# Patient Record
Sex: Female | Born: 1950 | Race: White | Hispanic: No | Marital: Married | Smoking: Never smoker
Health system: Southern US, Community
[De-identification: ages and names within clinical notes are randomized; demographics above are authoritative.]

## PROBLEM LIST (undated history)

## (undated) DIAGNOSIS — D649 Anemia, unspecified: Secondary | ICD-10-CM

## (undated) DIAGNOSIS — E039 Hypothyroidism, unspecified: Secondary | ICD-10-CM

## (undated) DIAGNOSIS — M810 Age-related osteoporosis without current pathological fracture: Secondary | ICD-10-CM

## (undated) DIAGNOSIS — I10 Essential (primary) hypertension: Secondary | ICD-10-CM

## (undated) DIAGNOSIS — F419 Anxiety disorder, unspecified: Secondary | ICD-10-CM

## (undated) DIAGNOSIS — T7840XA Allergy, unspecified, initial encounter: Secondary | ICD-10-CM

## (undated) DIAGNOSIS — L130 Dermatitis herpetiformis: Secondary | ICD-10-CM

## (undated) DIAGNOSIS — K219 Gastro-esophageal reflux disease without esophagitis: Secondary | ICD-10-CM

## (undated) HISTORY — PX: COLONOSCOPY: SHX174

## (undated) HISTORY — DX: Anxiety disorder, unspecified: F41.9

## (undated) HISTORY — DX: Allergy, unspecified, initial encounter: T78.40XA

## (undated) HISTORY — DX: Age-related osteoporosis without current pathological fracture: M81.0

## (undated) HISTORY — DX: Gastro-esophageal reflux disease without esophagitis: K21.9

## (undated) HISTORY — DX: Dermatitis herpetiformis: L13.0

## (undated) HISTORY — PX: CYSTECTOMY: SHX5119

## (undated) HISTORY — PX: APPENDECTOMY: SHX54

## (undated) HISTORY — DX: Essential (primary) hypertension: I10

---

## 1998-06-07 ENCOUNTER — Other Ambulatory Visit: Admission: RE | Admit: 1998-06-07 | Discharge: 1998-06-07 | Payer: Self-pay | Admitting: Obstetrics and Gynecology

## 1998-08-13 ENCOUNTER — Emergency Department (HOSPITAL_COMMUNITY): Admission: EM | Admit: 1998-08-13 | Discharge: 1998-08-13 | Payer: Self-pay | Admitting: Emergency Medicine

## 1999-06-08 ENCOUNTER — Other Ambulatory Visit: Admission: RE | Admit: 1999-06-08 | Discharge: 1999-06-08 | Payer: Self-pay | Admitting: Obstetrics and Gynecology

## 2000-05-23 ENCOUNTER — Encounter: Admission: RE | Admit: 2000-05-23 | Discharge: 2000-05-23 | Payer: Self-pay | Admitting: Unknown Physician Specialty

## 2000-05-23 ENCOUNTER — Encounter: Payer: Self-pay | Admitting: Obstetrics and Gynecology

## 2000-06-11 ENCOUNTER — Other Ambulatory Visit: Admission: RE | Admit: 2000-06-11 | Discharge: 2000-06-11 | Payer: Self-pay | Admitting: Obstetrics and Gynecology

## 2001-05-25 ENCOUNTER — Encounter: Payer: Self-pay | Admitting: Obstetrics and Gynecology

## 2001-05-25 ENCOUNTER — Encounter: Admission: RE | Admit: 2001-05-25 | Discharge: 2001-05-25 | Payer: Self-pay | Admitting: Obstetrics and Gynecology

## 2001-06-17 ENCOUNTER — Other Ambulatory Visit: Admission: RE | Admit: 2001-06-17 | Discharge: 2001-06-17 | Payer: Self-pay | Admitting: Obstetrics and Gynecology

## 2002-09-20 ENCOUNTER — Other Ambulatory Visit: Admission: RE | Admit: 2002-09-20 | Discharge: 2002-09-20 | Payer: Self-pay | Admitting: Obstetrics and Gynecology

## 2002-09-23 ENCOUNTER — Encounter: Admission: RE | Admit: 2002-09-23 | Discharge: 2002-09-23 | Payer: Self-pay | Admitting: Obstetrics and Gynecology

## 2002-09-23 ENCOUNTER — Encounter: Payer: Self-pay | Admitting: Obstetrics and Gynecology

## 2003-07-22 ENCOUNTER — Ambulatory Visit (HOSPITAL_COMMUNITY): Admission: RE | Admit: 2003-07-22 | Discharge: 2003-07-22 | Payer: Self-pay | Admitting: Obstetrics and Gynecology

## 2003-07-22 ENCOUNTER — Encounter (INDEPENDENT_AMBULATORY_CARE_PROVIDER_SITE_OTHER): Payer: Self-pay | Admitting: *Deleted

## 2003-09-27 ENCOUNTER — Encounter: Admission: RE | Admit: 2003-09-27 | Discharge: 2003-09-27 | Payer: Self-pay | Admitting: Obstetrics and Gynecology

## 2003-12-08 ENCOUNTER — Other Ambulatory Visit: Admission: RE | Admit: 2003-12-08 | Discharge: 2003-12-08 | Payer: Self-pay | Admitting: Obstetrics and Gynecology

## 2004-10-15 ENCOUNTER — Encounter: Admission: RE | Admit: 2004-10-15 | Discharge: 2004-10-15 | Payer: Self-pay | Admitting: Obstetrics and Gynecology

## 2004-12-12 ENCOUNTER — Other Ambulatory Visit: Admission: RE | Admit: 2004-12-12 | Discharge: 2004-12-12 | Payer: Self-pay | Admitting: Obstetrics and Gynecology

## 2005-10-22 ENCOUNTER — Encounter: Admission: RE | Admit: 2005-10-22 | Discharge: 2005-10-22 | Payer: Self-pay | Admitting: Obstetrics and Gynecology

## 2006-01-02 ENCOUNTER — Other Ambulatory Visit: Admission: RE | Admit: 2006-01-02 | Discharge: 2006-01-02 | Payer: Self-pay | Admitting: Obstetrics and Gynecology

## 2006-11-11 ENCOUNTER — Encounter: Admission: RE | Admit: 2006-11-11 | Discharge: 2006-11-11 | Payer: Self-pay | Admitting: Obstetrics and Gynecology

## 2007-10-23 ENCOUNTER — Ambulatory Visit: Payer: Self-pay | Admitting: Internal Medicine

## 2007-10-23 LAB — CONVERTED CEMR LAB
Albumin: 4 g/dL (ref 3.5–5.2)
Alkaline Phosphatase: 57 units/L (ref 39–117)
BUN: 20 mg/dL (ref 6–23)
Basophils Absolute: 0 10*3/uL (ref 0.0–0.1)
Bilirubin Urine: NEGATIVE
Cholesterol: 158 mg/dL (ref 0–200)
Creatinine, Ser: 0.8 mg/dL (ref 0.4–1.2)
Eosinophils Absolute: 0.6 10*3/uL (ref 0.0–0.6)
GFR calc Af Amer: 95 mL/min
GFR calc non Af Amer: 79 mL/min
Glucose, Urine, Semiquant: NEGATIVE
HDL: 41.8 mg/dL (ref >39.0)
Hemoglobin: 13.4 g/dL (ref 12.0–15.0)
Ketones, urine, test strip: NEGATIVE
LDL Cholesterol: 104 mg/dL — ABNORMAL HIGH (ref 0–99)
MCHC: 33.5 g/dL (ref 30.0–36.0)
MCV: 90.1 fL (ref 78.0–100.0)
Monocytes Absolute: 0.5 10*3/uL (ref 0.2–0.7)
Monocytes Relative: 9.4 % (ref 3.0–11.0)
Neutro Abs: 3.1 10*3/uL (ref 1.4–7.7)
Nitrite: NEGATIVE
Potassium: 5.1 meq/L (ref 3.5–5.1)
Protein, U semiquant: NEGATIVE
RDW: 12.5 % (ref 11.5–14.6)
Specific Gravity, Urine: 1.025
TSH: 3.54 microintl units/mL (ref 0.35–5.50)
Urobilinogen, UA: 0.2
pH: 6

## 2007-11-04 ENCOUNTER — Ambulatory Visit: Payer: Self-pay | Admitting: Internal Medicine

## 2007-11-04 DIAGNOSIS — K219 Gastro-esophageal reflux disease without esophagitis: Secondary | ICD-10-CM | POA: Insufficient documentation

## 2007-11-04 DIAGNOSIS — I10 Essential (primary) hypertension: Secondary | ICD-10-CM | POA: Insufficient documentation

## 2007-11-05 ENCOUNTER — Telehealth: Payer: Self-pay | Admitting: Internal Medicine

## 2007-12-04 ENCOUNTER — Encounter: Admission: RE | Admit: 2007-12-04 | Discharge: 2007-12-04 | Payer: Self-pay | Admitting: Obstetrics and Gynecology

## 2007-12-25 ENCOUNTER — Ambulatory Visit: Payer: Self-pay | Admitting: Internal Medicine

## 2008-02-22 ENCOUNTER — Ambulatory Visit: Payer: Self-pay | Admitting: Internal Medicine

## 2008-02-22 DIAGNOSIS — D4959 Neoplasm of unspecified behavior of other genitourinary organ: Secondary | ICD-10-CM | POA: Insufficient documentation

## 2008-02-23 ENCOUNTER — Encounter: Payer: Self-pay | Admitting: Internal Medicine

## 2008-03-24 ENCOUNTER — Encounter (INDEPENDENT_AMBULATORY_CARE_PROVIDER_SITE_OTHER): Payer: Self-pay | Admitting: Obstetrics and Gynecology

## 2008-03-24 ENCOUNTER — Ambulatory Visit (HOSPITAL_COMMUNITY): Admission: RE | Admit: 2008-03-24 | Discharge: 2008-03-24 | Payer: Self-pay | Admitting: Obstetrics and Gynecology

## 2008-04-11 ENCOUNTER — Ambulatory Visit: Payer: Self-pay | Admitting: Internal Medicine

## 2008-04-14 ENCOUNTER — Telehealth: Payer: Self-pay | Admitting: Internal Medicine

## 2008-04-14 ENCOUNTER — Encounter: Payer: Self-pay | Admitting: Internal Medicine

## 2008-04-25 ENCOUNTER — Telehealth: Payer: Self-pay | Admitting: Internal Medicine

## 2008-04-26 ENCOUNTER — Ambulatory Visit: Payer: Self-pay | Admitting: Internal Medicine

## 2008-04-27 ENCOUNTER — Telehealth: Payer: Self-pay | Admitting: Internal Medicine

## 2008-08-22 ENCOUNTER — Ambulatory Visit: Payer: Self-pay | Admitting: Internal Medicine

## 2008-08-22 DIAGNOSIS — F411 Generalized anxiety disorder: Secondary | ICD-10-CM | POA: Insufficient documentation

## 2008-08-25 ENCOUNTER — Emergency Department (HOSPITAL_COMMUNITY): Admission: EM | Admit: 2008-08-25 | Discharge: 2008-08-25 | Payer: Self-pay | Admitting: Family Medicine

## 2008-10-03 ENCOUNTER — Ambulatory Visit: Payer: Self-pay | Admitting: Internal Medicine

## 2008-10-13 ENCOUNTER — Encounter
Admission: RE | Admit: 2008-10-13 | Discharge: 2008-11-03 | Payer: Self-pay | Admitting: Physical Medicine & Rehabilitation

## 2008-12-26 ENCOUNTER — Telehealth: Payer: Self-pay | Admitting: Internal Medicine

## 2008-12-28 ENCOUNTER — Ambulatory Visit: Payer: Self-pay | Admitting: Family Medicine

## 2009-01-02 ENCOUNTER — Encounter: Admission: RE | Admit: 2009-01-02 | Discharge: 2009-01-02 | Payer: Self-pay | Admitting: Obstetrics and Gynecology

## 2009-01-25 ENCOUNTER — Ambulatory Visit: Payer: Self-pay | Admitting: Family Medicine

## 2009-06-20 ENCOUNTER — Telehealth: Payer: Self-pay | Admitting: Internal Medicine

## 2009-06-26 ENCOUNTER — Ambulatory Visit: Payer: Self-pay | Admitting: Internal Medicine

## 2010-01-22 ENCOUNTER — Encounter: Admission: RE | Admit: 2010-01-22 | Discharge: 2010-01-22 | Payer: Self-pay | Admitting: Obstetrics and Gynecology

## 2010-05-27 ENCOUNTER — Emergency Department (HOSPITAL_COMMUNITY): Admission: EM | Admit: 2010-05-27 | Discharge: 2010-05-27 | Payer: Self-pay | Admitting: Family Medicine

## 2010-08-06 ENCOUNTER — Ambulatory Visit: Payer: Self-pay | Admitting: Internal Medicine

## 2010-08-06 LAB — CONVERTED CEMR LAB
AST: 22 units/L (ref 0–37)
Albumin: 3.9 g/dL (ref 3.5–5.2)
Basophils Absolute: 0 10*3/uL (ref 0.0–0.1)
Basophils Relative: 1 % (ref 0.0–3.0)
Bilirubin Urine: NEGATIVE
CO2: 29 meq/L (ref 19–32)
Chloride: 108 meq/L (ref 96–112)
Eosinophils Absolute: 0.6 10*3/uL (ref 0.0–0.7)
Glucose, Bld: 96 mg/dL (ref 70–99)
HCT: 37.9 % (ref 36.0–46.0)
HDL: 49.4 mg/dL (ref >39.00)
Hemoglobin: 13 g/dL (ref 12.0–15.0)
Lymphs Abs: 1.1 10*3/uL (ref 0.7–4.0)
MCHC: 34.4 g/dL (ref 30.0–36.0)
Neutro Abs: 2.4 10*3/uL (ref 1.4–7.7)
Nitrite: NEGATIVE
Potassium: 4 meq/L (ref 3.5–5.1)
RBC: 4.14 M/uL (ref 3.87–5.11)
RDW: 13.2 % (ref 11.5–14.6)
Sodium: 143 meq/L (ref 135–145)
Specific Gravity, Urine: 1.02 (ref 1.000–1.030)
TSH: 4.21 microintl units/mL (ref 0.35–5.50)
Total CHOL/HDL Ratio: 4
Total Protein, Urine: NEGATIVE mg/dL
Total Protein: 6.9 g/dL (ref 6.0–8.3)
pH: 5.5 (ref 5.0–8.0)

## 2010-08-16 ENCOUNTER — Ambulatory Visit: Payer: Self-pay | Admitting: Internal Medicine

## 2010-08-31 ENCOUNTER — Telehealth: Payer: Self-pay | Admitting: Internal Medicine

## 2010-08-31 ENCOUNTER — Ambulatory Visit: Payer: Self-pay | Admitting: Internal Medicine

## 2010-09-24 ENCOUNTER — Ambulatory Visit
Admission: RE | Admit: 2010-09-24 | Discharge: 2010-09-24 | Payer: Self-pay | Source: Home / Self Care | Attending: Internal Medicine | Admitting: Internal Medicine

## 2010-10-11 NOTE — Assessment & Plan Note (Signed)
Summary: CONGESTION // RS   Vital Signs:  Patient profile:   60 year old female Height:      64 inches Weight:      148 pounds BMI:     25.50 O2 Sat:      92 % on Room air Temp:     99.3 degrees F oral Pulse rate:   103 / minute BP sitting:   100 / 78  (left arm)  Vitals Entered By: Jeremy Johann CMA (August 16, 2010 10:24 AM)  O2 Flow:  Room air CC: cough,wheezing, drainage, burning in chest, pain in teethx1day   CC:  cough, wheezing, drainage, burning in chest, and pain in teethx1day.  History of Present Illness: Patient presents to clinic as a workin for evaluation of URI symptoms and wheezing. Notes recent 2d h/o bilateral maxillary sinus pressure and NP cough without f/c or shortness of breath. H/o asthma using advair as needed and does not have active prescription for albuterol mdi at home. H/o HTN prescribed norvasc 5mg  once daily but states BP improved with exercise and wt loss. Was told by outside provider to stop bp med however bp begin to rise. Pt resumed at 2.5mg  once daily and BP now normotensive. Tolerates medication without adverse effect.   Current Medications (verified): 1)  Proventil Hfa 108 (90 Base) Mcg/act  Aers (Albuterol Sulfate) .... 2 Puffs Q 6 Hours As Needed 2)  Amlodipine Besylate 5 Mg  Tabs (Amlodipine Besylate) .... One By Mouth Once Daily 3)  Singulair 10 Mg Tabs (Montelukast Sodium) .... One By Mouth Once Daily 4)  Aspirin 81 Mg Tabs (Aspirin) .... Once Daily 5)  Advair Diskus 100-50 Mcg/dose Misc (Fluticasone-Salmeterol) .Marland Kitchen.. 1 Puff 2 Times Daily  Allergies (verified): 1)  ! Sulfa 2)  ! Prednisone 3)  ! Pcn 4)  ! Ace Inhibitors  Review of Systems      See HPI  Physical Exam  General:  Well-developed,well-nourished,in no acute distress; alert,appropriate and cooperative throughout examination Head:  Normocephalic and atraumatic without obvious abnormalities. No apparent alopecia or balding. Eyes:  vision grossly intact, pupils equal,  and pupils round.   Ears:  External ear exam shows no significant lesions or deformities.  Otoscopic examination reveals clear canals, tympanic membranes are intact bilaterally without bulging, retraction, inflammation or discharge. Hearing is grossly normal bilaterally. Nose:  External nasal examination shows no deformity or inflammation. Nasal mucosa are pink and moist without lesions or exudates. Mouth:  Oral mucosa and oropharynx without lesions or exudates.  Teeth in good repair. Neck:  No deformities, masses, or tenderness noted. Lungs:  Normal respiratory effort, chest expands symmetrically. Lungs are clear to auscultation, no crackles or wheezes. Heart:  Normal rate and regular rhythm. S1 and S2 normal without gallop, murmur, click, rub or other extra sounds.   Impression & Recommendations:  Problem # 1:  ASTHMA (ICD-493.90) Mild in severity. No evidence of respiratory distress. Refill and resume albuterol as needed. Discussed advair as maintenance medication and not rescue (rinse mouth with use.) Followup closely if no improvement or worsening.  Her updated medication list for this problem includes:    Proventil Hfa 108 (90 Base) Mcg/act Aers (Albuterol sulfate) .Marland Kitchen... 2 puffs q 6 hours as needed    Singulair 10 Mg Tabs (Montelukast sodium) ..... One by mouth once daily    Advair Diskus 100-50 Mcg/dose Misc (Fluticasone-salmeterol) .Marland Kitchen... 1 puff 2 times daily  Problem # 2:  URI (ICD-465.9) Suspect viral etiology at this point. Advise symptomatic  otc treatment. Follwup if no improvment or worsening.  Problem # 3:  HYPERTENSION (ICD-401.9) Currently normotensive s/p medication adjustment. Continue current regimen, monitor bp as an outpt and followup in clinic as scheduled.  Her updated medication list for this problem includes:    Amlodipine Besylate 5 Mg Tabs (Amlodipine besylate) .Marland Kitchen... 1/2 by mouth qd  Complete Medication List: 1)  Proventil Hfa 108 (90 Base) Mcg/act Aers  (Albuterol sulfate) .... 2 puffs q 6 hours as needed 2)  Amlodipine Besylate 5 Mg Tabs (Amlodipine besylate) .... 1/2 by mouth qd 3)  Singulair 10 Mg Tabs (Montelukast sodium) .... One by mouth once daily 4)  Aspirin 81 Mg Tabs (Aspirin) .... Once daily 5)  Advair Diskus 100-50 Mcg/dose Misc (Fluticasone-salmeterol) .Marland Kitchen.. 1 puff 2 times daily  Patient Instructions: 1)  Get plenty of rest, drink lots of clear liquids, and use Tylenol or Ibuprofen for fever and comfort. Return in 7-10 days if you're not better:sooner if you're feeling worse. Prescriptions: PROVENTIL HFA 108 (90 BASE) MCG/ACT  AERS (ALBUTEROL SULFATE) 2 puffs q 6 hours as needed  #1 x 6   Entered and Authorized by:   Edwyna Perfect MD   Signed by:   Edwyna Perfect MD on 08/16/2010   Method used:   Electronically to        [facility] [address] [phone]* (retail)       [address]       [place], [place]  [address]       Ph: [phone] or [phone]       Fax: [phone]   RxID:   [id number]    Orders Added: 1)  Est. Patient Level IV [47425]

## 2010-10-11 NOTE — Assessment & Plan Note (Signed)
Summary: CPX // RS---PT RSC (BMP) // RS   Vital Signs:  Patient profile:   60 year old female Height:      64.25 inches Weight:      149 pounds Temp:     98.7 degrees F oral Pulse rate:   80 / minute Pulse rhythm:   regular BP sitting:   104 / 76  (left arm) Cuff size:   regular  Vitals Entered By: Alfred Levins, CMA (August 31, 2010 9:07 AM) CC: cpx, no pap   CC:  cpx and no pap.  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 2 Puffs Q 6 Hours As Needed 2)  Amlodipine Besylate 5 Mg  Tabs (Amlodipine Besylate) .... 1/2 By Mouth Qd 3)  Singulair 10 Mg Tabs (Montelukast Sodium) .... One By Mouth Once Daily 4)  Aspirin 81 Mg Tabs (Aspirin) .... Once Daily 5)  Advair Diskus 100-50 Mcg/dose Misc (Fluticasone-Salmeterol) .Marland Kitchen.. 1 Puff 2 Times Daily 6)  Calcium 600 Mg Tabs (Calcium) .... Once Daily 7)  Juice Plus Fibre  Liqd (Nutritional Supplements) .... Once Daily  Allergies (verified): 1)  ! Sulfa 2)  ! Prednisone 3)  ! Pcn 4)  ! Ace Inhibitors  Past History:  Past Medical History: Last updated: 08/22/2008 Asthma GERD Hypertension Anxiety  Past Surgical History: Last updated: 04/11/2008 Appendectomy D &C bleeding vaginal cyst removal  03/28/08--neg per pt  Family History: Last updated: 11/04/2007 father --alcoholic--- mother---MS, lung Cancer age 49 Sister with MS  Social History: Last updated: 11/04/2007 widow Never Smoked Alcohol use-no Regular exercise-yes  Risk Factors: Exercise: yes (11/04/2007)  Risk Factors: Smoking Status: never (06/26/2009)  Physical Exam  General:  well-developed  well-nourished female in no acute distress. HEENT exam atraumatic, normocephalic symmetric muscles are intact. Neck is supple without lymphadenopathy or thyromegaly. Chest clear auscultation cardiac exam S1-S2 are regular abdominal exam it breath sounds, soft, nontender. Extremities is no clubbing cyanosis or edema. Gait is normal.   Impression & Recommendations:  Problem # 1:  PHYSICAL EXAMINATION (ICD-V70.0) health maintenance up-to-date. she has had a protracted URI. Reviewed Dr. Ty Hilts. I'll try an antibiotic. Doxycycline 100 mg p.o. b.i.d.  Complete Medication List: 1)  Proventil Hfa 108 (90 Base) Mcg/act Aers (Albuterol sulfate) .... 2 puffs q 6 hours as needed 2)  Amlodipine Besylate 5 Mg Tabs (Amlodipine besylate) .... 1/2 by mouth qd 3)  Singulair 10 Mg Tabs (Montelukast sodium) .... One by mouth once daily 4)  Aspirin 81 Mg Tabs (Aspirin) .... Once daily 5)  Advair Diskus 100-50 Mcg/dose Misc (Fluticasone-salmeterol) .Marland Kitchen.. 1 puff 2 times daily 6)  Calcium 600 Mg Tabs (Calcium) .... Once daily 7)  Juice Plus Fibre Liqd (Nutritional supplements) .... Once daily 8)  Doxycycline Hyclate 100 Mg Caps (Doxycycline hyclate) .... Take 1 tab twice a day  Other Orders: Tdap => 80yrs IM ([address]109) Admin 1st Vaccine (60454) Flu Vaccine 60yrs + (09811) Admin of Any Addtl Vaccine (91478) Prescriptions: PROVENTIL HFA 108 (90 BASE) MCG/ACT  AERS (ALBUTEROL SULFATE) 2 puffs q 6 hours as needed  #3 x 3   Entered by:   Willy Eddy, LPN   Authorized by:  Birdie Sons MD   Signed by:   Willy Eddy, LPN on 44/09/270   Method used:   Electronically to        [facility]* (retail)             ,          Ph: [phone]       Fax: [phone]   RxID:   [id number] ADVAIR DISKUS 100-50 MCG/DOSE MISC (FLUTICASONE-SALMETEROL) 1 puff 2 times daily  #3 x 3   Entered by:   Willy Eddy, LPN   Authorized by:   Birdie Sons MD   Signed by:   Willy Eddy, LPN on 30/16/0109    Method used:   Electronically to        [facility]* (retail)             ,          Ph: [phone]       Fax: [phone]   RxID:   [id number] SINGULAIR 10 MG TABS (MONTELUKAST SODIUM) one by mouth once daily  #90 x 3   Entered by:   Willy Eddy, LPN   Authorized by:   Birdie Sons MD   Signed by:   Willy Eddy, LPN on 03/04/9484   Method used:   Electronically to        [facility]* (retail)             ,          Ph: [phone]       Fax: [phone]   RxID:   [id number] ADVAIR DISKUS 100-50 MCG/DOSE MISC (FLUTICASONE-SALMETEROL) 1 puff 2 times daily  #3 x 3   Entered and Authorized by:   Birdie Sons MD   Signed by:   Birdie Sons MD on 08/31/2010   Method used:   Electronically to        [facility] [address] [phone]* (retail)       [address]       [place], [place]  [address]       Ph: [phone] or [phone]       Fax: [phone]   RxID:   [id number] SINGULAIR 10 MG TABS (MONTELUKAST SODIUM) one by mouth once daily  #90 x 3   Entered and Authorized by:   Birdie Sons MD   Signed by:   Birdie Sons MD on 08/31/2010   Method used:   Electronically to        [facility] [address] [phone]* (retail)       [address]       [place], [place]  [address]       Ph: [phone] or [phone]       Fax: [phone]   RxID:   [id number] AMLODIPINE BESYLATE 5 MG  TABS (AMLODIPINE BESYLATE) 1/2 by mouth qd  #90 x 3   Entered and Authorized by:   Birdie Sons MD   Signed by:   Birdie Sons MD on 08/31/2010   Method used:   Electronically to        [facility] [address] [phone]* (retail)       [address]       [place], [place]  [address]       Ph: [phone] or [phone]  Fax: [phone]   RxID:   [id number] DOXYCYCLINE HYCLATE 100 MG CAPS (DOXYCYCLINE HYCLATE) Take 1 tab twice a day  #20 x 0   Entered and Authorized by:   Birdie Sons MD   Signed by:    Birdie Sons MD on 08/31/2010   Method used:   Electronically to        [facility] [address] [phone]* (retail)       [address]       [place], [place]  [address]       Ph: [phone] or [phone]       Fax: [phone]   RxID:   [id number]    Orders Added: 1)  Est. Patient 40-64 years [99396] 2)  Tdap => 1yrs IM [90715] 3)  Admin 1st Vaccine [90471] 4)  Flu Vaccine 62yrs + [88416] 5)  Admin of Any Addtl Vaccine [90472]   Immunizations Administered:  Tetanus Vaccine:    Vaccine Type: Tdap    Site: right deltoid    Mfr: GlaxoSmithKline    Dose: 0.5 ml    Route: IM    Given by: Alfred Levins, CMA    Exp. Date: 06/28/2012    Lot #: [id number]  Influenza Vaccine # 1:    Vaccine Type: Fluvax 3+    Site: left deltoid    Mfr: Sanofi Pasteur    Dose: 0.5 ml    Route: IM    Given by: Alfred Levins, CMA    Exp. Date: 03/09/2011    Lot #: [id number]  Flu Vaccine Consent Questions:    Do you have a history of severe allergic reactions to this vaccine? no    Any prior history of allergic reactions to egg and/or gelatin? no    Do you have a sensitivity to the preservative Thimersol? no    Do you have a past history of Guillan-Barre Syndrome? no    Do you currently have an acute febrile illness? no    Have you ever had a severe reaction to latex? no    Vaccine information given and explained to patient? yes    Are you currently pregnant? no   Immunizations Administered:  Tetanus Vaccine:    Vaccine Type: Tdap    Site: right deltoid    Mfr: GlaxoSmithKline    Dose: 0.5 ml    Route: IM    Given by: Alfred Levins, CMA    Exp. Date: 06/28/2012    Lot #: [id number]  Influenza Vaccine # 1:    Vaccine Type: Fluvax 3+    Site: left deltoid    Mfr: Sanofi Pasteur    Dose: 0.5 ml    Route: IM    Given by: Alfred Levins, CMA    Exp. Date: 03/09/2011    Lot #: [id number]  Preventive Care Screening  Mammogram:    Date:  02/07/2010    Next Due:   02/2011    Results:  normal   Pap Smear:    Date:  02/07/2010    Next Due:  02/2011    Results:  normal

## 2010-10-11 NOTE — Assessment & Plan Note (Signed)
Summary: pink eye/sore throat/cjr   Vital Signs:  Patient profile:   60 year old female Weight:      147 pounds Pulse rate:   72 / minute BP sitting:   108 / 74  (left arm)  Vitals Entered By: Kyung Rudd, CMA (September 24, 2010 10:51 AM) CC: pt c/o ST and conjunctivitis...son and granddaughter both have conjuctivitis   CC:  pt c/o ST and conjunctivitis...son and granddaughter both have conjuctivitis.  History of Present Illness: Patient presents to clinic as a workin for evaluation of pink eye. Notes several day h/o slightly red irritated right eye with + drainage. +ST and ear pressure. +sick exposure with 2 family members being treated with abx for similar symptoms. Also notes last week developed lbp radiating to upper thigh but now resolved spontaneously. No trauma/injury/leg weakness. Took motrin 600mg .  Now with only intermittent lbp without radicular symptoms.   Current Medications (verified): 1)  Proventil Hfa 108 (90 Base) Mcg/act  Aers (Albuterol Sulfate) .... 2 Puffs Q 6 Hours As Needed 2)  Amlodipine Besylate 5 Mg  Tabs (Amlodipine Besylate) .... 1/2 By Mouth Qd 3)  Singulair 10 Mg Tabs (Montelukast Sodium) .... One By Mouth Once Daily 4)  Aspirin 81 Mg Tabs (Aspirin) .... Once Daily 5)  Advair Diskus 100-50 Mcg/dose Misc (Fluticasone-Salmeterol) .Marland Kitchen.. 1 Puff 2 Times Daily 6)  Calcium 600 Mg Tabs (Calcium) .... Once Daily 7)  Juice Plus Fibre  Liqd (Nutritional Supplements) .... Once Daily 8)  Flexeril 5 Mg Tabs (Cyclobenzaprine Hcl) .Marland Kitchen.. 1 Tab Three Times A Day For 5 Days 9)  Ibuprofen 600 Mg Tabs (Ibuprofen) .Marland Kitchen.. 1 Tab Three Times A Day 10)  Norco 5-325 Mg Tabs (Hydrocodone-Acetaminophen) .Marland Kitchen.. 1 Tab Q 4-6 Hours As Needed  Allergies (verified): 1)  ! Sulfa 2)  ! Prednisone 3)  ! Pcn 4)  ! Ace Inhibitors  Past History:  Past medical, surgical, family and social histories (including risk factors) reviewed, and no changes noted (except as noted below).  Past  Medical History: Reviewed history from 08/22/2008 and no changes required. Asthma GERD Hypertension Anxiety  Past Surgical History: Reviewed history from 04/11/2008 and no changes required. Appendectomy D &C bleeding vaginal cyst removal  03/28/08--neg per pt  Family History: Reviewed history from 11/04/2007 and no changes required. father --alcoholic--- mother---MS, lung Cancer age 72 Sister with MS  Social History: Reviewed history from 11/04/2007 and no changes required. widow Never Smoked Alcohol use-no Regular exercise-yes  Review of Systems General:  Denies chills, fever, and sweats. Eyes:  Complains of discharge, eye irritation, and red eye; denies eye pain and vision loss-1 eye. ENT:  Complains of nasal congestion and sore throat; denies ear discharge and earache. Resp:  Denies cough, shortness of breath, and sputum productive. MS:  Complains of low back pain; denies muscle weakness.  Physical Exam  General:  Well-developed,well-nourished,in no acute distress; alert,appropriate and cooperative throughout examination Head:  Normocephalic and atraumatic without obvious abnormalities. No apparent alopecia or balding. Eyes:  pupils equal, pupils round, and conjunctival injection and mild erythema right eye. No discharge noted   Ears:  External ear exam shows no significant lesions or deformities.  Otoscopic examination reveals clear canals, tympanic membranes are intact bilaterally without bulging, retraction, inflammation or discharge. Hearing is grossly normal bilaterally. Nose:  External nasal examination shows no deformity or inflammation. Nasal mucosa are pink and moist without lesions or exudates. Mouth:  + mild op erythema good dentition, no posterior lymphoid hypertrophy, no postnasal drip,  and no lesions.   Neck:  No deformities, masses, or tenderness noted. Lungs:  Normal respiratory effort, chest expands symmetrically. Lungs are clear to auscultation, no  crackles or wheezes. Msk:  normal ROM.  no mildline ld tenderness or bony abn. no paraspinal muscle spasm. able to wt bear and ambulate without assistance. Neurologic:  alert & oriented X3 and gait normal.   Skin:  turgor normal, color normal, and no rashes.     Impression & Recommendations:  Problem # 1:  URI (ICD-465.9) Assessment New Begin abx tx. Followup if no improvement or worsening.  Her updated medication list for this problem includes:    Aspirin 81 Mg Tabs (Aspirin) ..... Once daily    Ibuprofen 600 Mg Tabs (Ibuprofen) .Marland Kitchen... 1 tab three times a day  Problem # 2:  BACK PAIN, LUMBAR (ICD-724.2) Assessment: New Muscle relaxer and analgesia as needed. Cautioned [id number] sedating effect Her updated medication list for this problem includes:    Aspirin 81 Mg Tabs (Aspirin) ..... Once daily    Flexeril 5 Mg Tabs (Cyclobenzaprine hcl) ..... One by mouth three times a day as needed muscle spasm    Ibuprofen 600 Mg Tabs (Ibuprofen) .Marland Kitchen... 1 tab three times a day    Norco 5-325 Mg Tabs (Hydrocodone-acetaminophen) .Marland Kitchen... 1 tab q 4-6 hours as needed  Complete Medication List: 1)  Proventil Hfa 108 (90 Base) Mcg/act Aers (Albuterol sulfate) .... 2 puffs q 6 hours as needed 2)  Amlodipine Besylate 5 Mg Tabs (Amlodipine besylate) .... 1/2 by mouth qd 3)  Singulair 10 Mg Tabs (Montelukast sodium) .... One by mouth once daily 4)  Aspirin 81 Mg Tabs (Aspirin) .... Once daily 5)  Advair Diskus 100-50 Mcg/dose Misc (Fluticasone-salmeterol) .Marland Kitchen.. 1 puff 2 times daily 6)  Calcium 600 Mg Tabs (Calcium) .... Once daily 7)  Juice Plus Fibre Liqd (Nutritional supplements) .... Once daily 8)  Flexeril 5 Mg Tabs (Cyclobenzaprine hcl) .... One by mouth three times a day as needed muscle spasm 9)  Ibuprofen 600 Mg Tabs (Ibuprofen) .Marland Kitchen.. 1 tab three times a day 10)  Norco 5-325 Mg Tabs (Hydrocodone-acetaminophen) .Marland Kitchen.. 1 tab q 4-6 hours as needed 11)  Zithromax Z-pak 250 Mg Tabs (Azithromycin) .... As  directed Prescriptions: NORCO 5-325 MG TABS (HYDROCODONE-ACETAMINOPHEN) 1 tab q 4-6 hours as needed  #20 x 0   Entered and Authorized by:   Edwyna Perfect MD   Signed by:   Edwyna Perfect MD on 09/24/2010   Method used:   Print then Give to Patient   RxID:   [id number] FLEXERIL 5 MG TABS (CYCLOBENZAPRINE HCL) one by mouth three times a day as needed muscle spasm  #15 x 0   Entered and Authorized by:   Edwyna Perfect MD   Signed by:   Edwyna Perfect MD on 09/24/2010   Method used:   Print then Give to Patient   RxID:   [id number] ZITHROMAX Z-PAK 250 MG TABS (AZITHROMYCIN) as directed  #1 x 0   Entered and Authorized by:   Edwyna Perfect MD   Signed by:   Edwyna Perfect MD on 09/24/2010   Method used:   Print then Give to Patient   RxID:   [id number]    Orders Added: 1)  Est. Patient Level IV [44034]

## 2010-10-11 NOTE — Progress Notes (Signed)
Summary: QUESTION CONCERNING MEDS / PLEASE RETURN CALL  Phone Note Call from Patient   Caller: Patient   [phone] Summary of Call: Pt called to advise that there was some confusion concerning the prescriptions that were sent in for her this morning.... would like to speak with Dr Cato Mulligan or Nurse to discuss meds.  Pt can be reached at [phone].  Initial call taken by: Debbra Riding,  August 31, 2010 3:34 PM  Follow-up for Phone Call        wants singulair,advair, and proventil sent to [facility]-(done)also questioning about amlodipine- she states she was told to stop ,but refill was sent in- does Dr Cato Mulligan want her to take?  pt aware it will be tuesday before this will be answered Follow-up by: Willy Eddy, LPN,  August 31, 2010 3:54 PM  Additional Follow-up for Phone Call Additional follow up Details #1::        amlodipine called in "just into case" . she is to stop amlodipine and monitor Bp goal BP less than 135/85 Additional Follow-up by: Birdie Sons MD,  September 03, 2010 4:53 PM    Additional Follow-up for Phone Call Additional follow up Details #2::    [facility]  Alfred Levins, CMA  September 04, 2010 1:23 PM [facility]  Alfred Levins, [place]  September 06, 2010 9:56 AM pt aware Follow-up by: Alfred Levins, CMA,  September 11, 2010 4:56 PM

## 2010-12-04 ENCOUNTER — Telehealth: Payer: Self-pay | Admitting: *Deleted

## 2010-12-04 NOTE — Telephone Encounter (Signed)
[facility] Triage Call Report Triage Record Num: [id number] Operator: Albertine Grates Patient Name: Tracey Boyd Call Date & Time: 12/03/2010 5:53:02PM Patient Phone: [phone] PCP: Valetta Mole. [place]words Patient Gender: Female PCP Fax : [phone] Patient DOB: 11-09-1950 [facility] Name: [facility] Reason for Call: Has cloudy urine since 3-26 and burns when goes. Afebrile. Denies back/flank pain. Advised follow up with office 3-27. States is going to UC. Protocol(s) Used: Urinary Symptoms - Female Recommended Outcome per Protocol: See Provider within 24 hours Reason for Outcome: Has one or more urinary tract symptoms Care Advice: ~ 12/03/2010 6:00:16PM Page 1 of 1 [email or web address]

## 2010-12-04 NOTE — Telephone Encounter (Signed)
Call patient. Make sure she is doing okay. Otherwise she needs an office visit within the available provider.

## 2010-12-05 NOTE — Telephone Encounter (Signed)
Pt went to Urgent Care on [address] and she was put on Cipro.  She is feeling better

## 2011-01-22 NOTE — H&P (Signed)
NAME:  Tracey Boyd, HARDRICK NO.:  [id number]   MEDICAL RECORD NO.:  [id number]          PATIENT TYPE:  AMB   LOCATION:  SDC                           [facility]ILITY:  [facility]   PHYSICIAN:  Juluis Mire, M.D.   DATE OF BIRTH:  [birth year]   DATE OF ADMISSION:  03/24/2008  DATE OF DISCHARGE:                              HISTORY & PHYSICAL   The patient is a 60 year old, gravida 1, para 1 female, who presents for  excision of a vaginal wall cyst.  Noted on exam that she had a left-  sided vaginal wall cyst.  Subsequent ultrasound confirmed the presence  of a simple cyst in the vaginal area.  It measured approximately a  centimeter.  Discussed options including watching conservatively versus  removal, but the patient now presents for surgical removal of the left  wall vaginal cyst.   ALLERGIES:  No known drug allergies.   MEDICATIONS:  At the present time, she is on a calcium channel blocker  for management of hypertension.   PAST MEDICAL HISTORY:  Usual childhood disease.  No significant  sequelae.  History of a ruptured appendix with removal and she has had  one vaginal delivery.   FAMILY HISTORY:  Noncontributory.   SOCIAL HISTORY:  No tobacco or alcohol use.   REVIEW OF SYSTEMS:  Noncontributory.   PHYSICAL EXAMINATION:  VITAL SIGNS:  The patient is afebrile with stable  vital signs.  HEENT:  The patient is normocephalic.  Pupils are equal, round, and  reactive to light and accommodation.  Extraocular movements are intact.  Sclerae and conjunctivae are clear.  Oropharynx clear.  NECK:  Without thyromegaly.  BREASTS:  Not examined.  LUNGS:  Clear.  CARDIOVASCULAR SYSTEM:  Regular rhythm and rate without murmurs or  gallops.  ABDOMEN:  Benign.  No mass, organomegaly, or tenderness.  PELVIC:  Normal external genitalia.  Vaginal mucosa does reveal a left-  sided vaginal wall cyst.  There is no other vaginal abnormality.  Cervix  unremarkable.  Uterus, normal size,  shape, and contour.  Adnexa free of  masses or tenderness.  EXTREMITIES:  Trace edema.  NEUROLOGIC:  Grossly within normal limits.   IMPRESSION:  Prior vaginal wall cyst.   PLAN:  The patient is to undergo surgical removal of vaginal wall cyst.  The risks of surgery have been discussed including the risk of  infection.  The risk of hemorrhage could require transfusion with risk  of AIDS or hepatitis.  Risk of injury to adjacent organs including  bladder, bowel, or ureters that could require further exploratory  surgery.  Risk of deep venous thrombosis and pulmonary emboli.  The  patient expressed understand of indications and risks.      Juluis Mire, M.D.  Electronically Signed     JSM/MEDQ  D:  03/24/2008  T:  03/24/2008  Job:  [id number]

## 2011-01-22 NOTE — Op Note (Signed)
NAME:  Tracey Boyd, Tracey Boyd NO.:  [id number]   MEDICAL RECORD NO.:  [id number]          PATIENT TYPE:  AMB   LOCATION:  SDC                           [facility]   PHYSICIAN:  Juluis Mire, M.D.   DATE OF BIRTH:  12/27/1950   DATE OF PROCEDURE:  03/24/2008  DATE OF DISCHARGE:                               OPERATIVE REPORT   PREOPERATIVE DIAGNOSIS:  Paravaginal cyst.   POSTOPERATIVE DIAGNOSIS:  Paravaginal cyst.   OPERATIVE PROCEDURE:  Excision of paravaginal cyst.   SURGEON:  Juluis Mire, MD   ANESTHESIA:  General.   ESTIMATED BLOOD LOSS:  Minimal.   PACKS AND DRAINS:  None.   INTRAOPERATIVE BLOOD PLACED:  None.   COMPLICATIONS:  None.   INDICATIONS:  Are dictated in history and physical.   PROCEDURE:  The patient was taken to the OR and placed in supine  position.  After satisfactory level of general anesthesia was obtained,  she was placed in the dorsal lithotomy position using the Allen  stirrups.  Perineum and vagina were cleansed out with Betadine.  The  patient was draped in sterile field.  A retractor was placed in the  vaginal vault.  She had a paravaginal cyst located on her left side.  It  was grasped with an Allis.  We entered the vaginal cyst.  Mucousy  material was removed.  We were able to dissect out the cyst lining.  It  was sent to pathology.  The defect was closed with a running suture of 3-  0 Vicryl Rapide.  Good hemostasis.  No active bleeding.  Bladder was  emptied by catheterization, was still clear.  The patient was taken out  of dorsal lithotomy position once alert and extubated, transferred to  the recovery room in good condition.  Sponge, instrument, and needle  count reported as correct by circulating nurse x2.      Juluis Mire, M.D.  Electronically Signed     JSM/MEDQ  D:  03/24/2008  T:  03/25/2008  Job:  [id number]

## 2011-01-25 NOTE — H&P (Signed)
NAME:  Tracey Boyd, Tracey Boyd                        ACCOUNT NO.:  [id number]   MEDICAL RECORD NO.:  [id number]                   PATIENT TYPE:  AMB   LOCATION:  SDC                                  [facility]   PHYSICIAN:  Juluis Mire, M.D.                DATE OF BIRTH:  1951/03/08   DATE OF ADMISSION:  07/22/2003  DATE OF DISCHARGE:                                HISTORY & PHYSICAL   HISTORY OF PRESENT ILLNESS:  The patient is a 60 year old postmenopausal  white female who presents for hysterectomy with D&C.   In relation to the present admission, patient was seen for evaluation of  postmenopausal bleeding.  She had a saline infusion ultrasound that was  performed on May 28 of last year.  She had several small intramural  fibroids.  There was no evidence of any endometrial extension.  We did do an  endometrial sampling which was unremarkable.  We watched her cycles  conservatively.  She has continued to have some abnormal bleeding and  because of this we did repeat the ultrasound.  It was basically  unremarkable.  She had no evidence of endometrial thickening but because of  continued bleeding we are going to proceed with hysteroscopic evaluation.   ALLERGIES:  No known drug allergies.   MEDICATIONS:  Advair as needed for asthma.   PAST MEDICAL HISTORY:  Usual childhood disease.  No significant sequelae.   PAST SURGICAL HISTORY:  She has had a previous appendectomy.   PAST OBSTETRICAL HISTORY:  She had one spontaneous vaginal delivery.   FAMILY HISTORY:  Noncontributory.   SOCIAL HISTORY:  No tobacco or alcohol use.   REVIEW OF SYSTEMS:  Noncontributory.   PHYSICAL EXAMINATION:  VITAL SIGNS:  The patient is afebrile with stable  vital signs.  HEENT:  The patient normocephalic.  Pupils are equal, round, and reactive to  light and accommodation.  Extraocular movements are intact.  Sclerae and  conjunctivae clear.  NECK:  Without thyromegaly.  BREASTS:  No discreet  masses.  LUNGS:  Clear.  CARDIAC:  Regular rhythm, rate.  No murmurs or gallops.  ABDOMEN:  Benign.  No mass, organomegaly, or tenderness.  PELVIC:  Normal external genitalia.  Vaginal mucosa is clear.  Cervix  unremarkable.  Uterus normal size, shape, and contour.  Adnexa free of  masses or tenderness.  Rectovaginal examination is clear.  EXTREMITIES:  Trace edema.  NEUROLOGIC:  Grossly within normal limits.   IMPRESSION:  Postmenopausal bleeding, rule out endometrial pathology.   PLAN:  The patient will undergo a hysteroscopy with D&C.  The risks of  surgery have been discussed including the risk of infection, the risk of  hemorrhage that could necessitate transfusion with the risk of AIDS or  hepatitis.  Risk of injury to adjacent organs through perforation that could  require exploratory surgery, risk of deep venous thrombosis and pulmonary  embolus.  The  patient expressed understanding of indications and risks.                                               Juluis Mire, M.D.    JSM/MEDQ  D:  07/22/2003  T:  07/22/2003  Job:  [id number]

## 2011-01-25 NOTE — Op Note (Signed)
   NAME:  Tracey Boyd, Tracey Boyd                        ACCOUNT NO.:  [id number]   MEDICAL RECORD NO.:  [id number]                   PATIENT TYPE:  AMB   LOCATION:  SDC                                  [facility]   PHYSICIAN:  Juluis Mire, M.D.                DATE OF BIRTH:  1951/08/01   DATE OF PROCEDURE:  07/22/2003  DATE OF DISCHARGE:                                 OPERATIVE REPORT   PREOPERATIVE DIAGNOSIS:  Postmenopausal bleeding.   POSTOPERATIVE DIAGNOSIS:  Postmenopausal bleeding.   OPERATIVE PROCEDURES:  1. Paracervical block.  2. Cervical dilation.  3. Removal of endocervical polyp.  4. Hysteroscopy resection of endometrium.  5. Endometrial curetting.   SURGEON:  Juluis Mire, M.D.   ANESTHESIA:  Paracervical block and sedation.   ESTIMATED BLOOD LOSS:  Minimal.   PACKS AND DRAINS:  None.   INTRAOPERATIVE BLOOD REPLACED:  None.   COMPLICATIONS:  None.   INDICATIONS:  Noted in the history and physical.   The procedure is as follows:  The patient was taken in the OR and placed in  the supine position.  After sedation, was placed in the dorsal lithotomy  position and draped as a sterile field.  A speculum was placed in the  vaginal vault, the cervix and vagina cleansed with Betadine.  A paracervical  block was instituted using 2% Nesacaine.  The cervix was grasped with a  single-tooth tenaculum.  The uterus sounded approximately 8 cm.  The cervix  was serially dilated to a size 35 Pratt dilator.  An endocervical polyp was  noted and removed using the Randall stone forceps.  The hysteroscope was  then introduced.  The intrauterine cavity was distended using sorbitol.  Visualization revealed a thinned, atrophic endometrium, no evidence of  thickness or polyps.  Multiple endometrial biopsies were obtained from the  anterior and posterior wall.  There was no sign of complication such as  perforation.  Deficit was minimal.  We then obtained endometrial curettings.   At this point in time a single-  tooth and speculum were then removed.  The patient was taken out of the  dorsal lithotomy position and once alert  transferred to the recovery room  in good condition.  Sponge, instrument, and needle count reported as correct  by the circulating nurse.                                               Juluis Mire, M.D.    JSM/MEDQ  D:  07/22/2003  T:  07/22/2003  Job:  [id number]

## 2011-02-18 ENCOUNTER — Encounter: Payer: Self-pay | Admitting: Internal Medicine

## 2011-02-22 ENCOUNTER — Ambulatory Visit (INDEPENDENT_AMBULATORY_CARE_PROVIDER_SITE_OTHER): Payer: 59 | Admitting: Internal Medicine

## 2011-02-22 ENCOUNTER — Encounter: Payer: Self-pay | Admitting: Internal Medicine

## 2011-02-22 VITALS — BP 110/86 | HR 89 | Temp 98.2°F | Ht 64.0 in | Wt 144.8 lb

## 2011-02-22 DIAGNOSIS — J449 Chronic obstructive pulmonary disease, unspecified: Secondary | ICD-10-CM

## 2011-02-22 DIAGNOSIS — J45909 Unspecified asthma, uncomplicated: Secondary | ICD-10-CM

## 2011-02-22 NOTE — Patient Instructions (Addendum)
Your spirometry today shows severe obstruction Please have full PFT next week here or  REturn to see me in < 1 week for PFT result reivew - overbook appointment

## 2011-02-22 NOTE — Assessment & Plan Note (Signed)
?   Asthma, ? COPD, ? asspociatd VCD - contributing to symptoms. ? Symptoms got worse paradoxically due to advair  PLAN Full PFT and reviewe At fu will consider initiating allergy workup depending on PFT result

## 2011-02-22 NOTE — Progress Notes (Signed)
Subjective:    Patient ID: Tracey Boyd, female    DOB: [birth year], 60 y.o.   MRN: [id number]  HPI  60 year old female. Mom of Mr. Tracey Boyd rep. Here for asthma consult. Referred by Dr. Juluis Mire  States she has dx of chronic asthma. Used to live in [place] (booth parents smoked). Diagnosed with asthma at age 8. Back in [place] used to have rare asthma attacks (needed albuterol during marathon or exercise or pollen and only 1/year). Then moved to [place] in 1983 at age 17 and noticed more intensity of asthma but still very occassional; needed an albuterol only 1 inh per year. Still doing lot of walking and golf without problems. However, in 2012 feels asthma got worse. She started noticing more dyspnea, wheezing, unable to sing choir. Was started on advair in jan 2012. Then singulair added in jan/feb. Feels symptoms got worse after singulair but also voice is hoarse, cracks and weird. So stopped singulair and advair 2 months ago.  But still dyspneic and using albuterol atleast once per night each month. Feels she takes a deep breath and then chest tightness. Also, still with cough and some sinus drainage. Feels mucus stuck in throat. Categorically denies active GERD although this is mentioned in past medical. Spirometry today at office shows Fev1 1.18L/48%, severe obstruction with ratio 58. She believes this is baseline and she has probably been living like this although only other time spirometry checked in past was in 1980s   Hx of spring allergies and positive allergen testing in 1980s. Never smoked but parents smoked. Mom died of lung cancer.  No mold in house. Works out of home. Lives in [place]. Asthma triggers: exercise, unpredictable nature. Associated risk factors: denies GERD but admits to possible post nasal drip. No ER visits, frequent prednisone bursts or intubations. Diagnosis: based on history, never had a spirometry since 1980s. Multiple drug allergies including  prednisone: hives and myalgia.  Husband is Doctor, hospital at [employer]. 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 - the [facility]  Review of Systems  Constitutional: Negative for fever and unexpected weight change.  HENT: Positive for voice change. Negative for ear pain, nosebleeds, congestion, sore throat, rhinorrhea, sneezing, trouble swallowing, dental problem, postnasal drip and sinus pressure.   Eyes: Negative for redness and itching.  Respiratory: Positive for cough and wheezing. Negative for chest tightness and shortness of breath.   Cardiovascular: Negative for palpitations and leg swelling.  Gastrointestinal: Negative for nausea and vomiting.  Genitourinary: Negative for dysuria.  Musculoskeletal: Negative for joint swelling.  Skin: Negative for rash.  Neurological: Negative for headaches.  Hematological: Does not bruise/bleed easily.  Psychiatric/Behavioral: Negative for dysphoric mood. The patient is not nervous/anxious.        Objective:   Physical Exam  Vitals reviewed. Constitutional: She is oriented to person, place, and time. She appears well-developed and well-nourished. No distress.  HENT:  Head: Normocephalic and atraumatic.  Right Ear: External ear normal.  Left Ear: External ear normal.  Mouth/Throat: Oropharynx is clear and moist. No oropharyngeal exudate.  Eyes: Conjunctivae and EOM are normal. Pupils are equal, round, and reactive to light. Right eye exhibits no discharge. Left eye exhibits no discharge. No scleral icterus.  Neck: Normal range of motion. Neck supple. No JVD present. No tracheal deviation present. No thyromegaly present.  Cardiovascular: Normal rate, regular rhythm, normal heart sounds and intact distal pulses.  Exam reveals no gallop and no friction rub.  No murmur heard. Pulmonary/Chest: Effort normal and breath sounds normal. No respiratory distress. She has no wheezes. She has no rales. She exhibits no tenderness.    Abdominal: Soft. Bowel sounds are normal. She exhibits no distension and no mass. There is no tenderness. There is no rebound and no guarding.  Musculoskeletal: Normal range of motion. She exhibits no edema and no tenderness.  Lymphadenopathy:    She has no cervical adenopathy.  Neurological: She is alert and oriented to person, place, and time. She has normal reflexes. No cranial nerve deficit. She exhibits normal muscle tone. Coordination normal.  Skin: Skin is warm and dry. No rash noted. She is not diaphoretic. No erythema. No pallor.  Psychiatric: She has a normal mood and affect. Her behavior is normal. Judgment and thought content normal.          Assessment & Plan:

## 2011-02-24 ENCOUNTER — Encounter: Payer: Self-pay | Admitting: Internal Medicine

## 2011-02-25 ENCOUNTER — Ambulatory Visit (INDEPENDENT_AMBULATORY_CARE_PROVIDER_SITE_OTHER): Payer: 59 | Admitting: Internal Medicine

## 2011-02-25 ENCOUNTER — Encounter: Payer: Self-pay | Admitting: Internal Medicine

## 2011-02-25 VITALS — BP 122/76 | HR 84 | Temp 98.3°F | Ht 65.0 in | Wt 146.0 lb

## 2011-02-25 DIAGNOSIS — J449 Chronic obstructive pulmonary disease, unspecified: Secondary | ICD-10-CM

## 2011-02-25 DIAGNOSIS — J4540 Moderate persistent asthma, uncomplicated: Secondary | ICD-10-CM

## 2011-02-25 DIAGNOSIS — J45909 Unspecified asthma, uncomplicated: Secondary | ICD-10-CM

## 2011-02-25 DIAGNOSIS — J45901 Unspecified asthma with (acute) exacerbation: Secondary | ICD-10-CM

## 2011-02-25 LAB — PULMONARY FUNCTION TEST

## 2011-02-25 MED ORDER — BECLOMETHASONE DIPROPIONATE 80 MCG/ACT IN AERS: 2.0000 | INHALATION_SPRAY | Freq: Two times a day (BID) | RESPIRATORY_TRACT | Status: DC

## 2011-02-25 NOTE — Patient Instructions (Addendum)
Tests show moderate - severe asthma Please start QVAR 2 puff twice daily, use with spacer. Use this daily regardless of how you feel If you are not seeing improvement in 10 days or so, please contact us If things are worsening anytime (nocturnal awakenings, symptoms) contact us immediately Use albuterol for rescue for symptoms Hold off on bubble study echo right now; I will talk to Dr. Cornelius Moras about it if you want me to  REturn in 3 weeks with me or Tammy my NP with spirometry (I am on vacation through 7/4-  7/23 and it is important you are seen in 3 weeks time) AT followup if lung function still shows room for  improvement, we can add singulair or consider switching you to symbicort Also at followup depending on response consider checking IgE and doing allergy evaluation

## 2011-02-25 NOTE — Progress Notes (Signed)
PFT done today. 

## 2011-02-25 NOTE — Progress Notes (Signed)
  Subjective:    Patient ID: Tracey Boyd, female    DOB: 27-Dec-1950, 60 y.o.   MRN: [id number]  HPI OV 02/25/2011: followu for asthma evaluation/diagnosis. PFTs 02/25/2011: shows findings c/w moderate-severe obstruction with BD response though TLC and DLCO are slightly diminished. Fev1 is 1.3/55%, 40% BD resppnse. TLC 75%, DLCO 78%. She is now reporting daily nocturnal awakenings. Not using albuterol for rescue. No other symptoms. States that Dr. Cornelius Moras of [facility] told her she needs bubble study   Review of Systems Daily nocturanl awakenings + Chest tightness sporadically + Raspy voice + Rest 11 point ROS negative    Objective:   Physical Exam  Vitals reviewed. Constitutional: She is oriented to person, place, and time. She appears well-developed and well-nourished. No distress.  HENT:  Head: Normocephalic and atraumatic.  Right Ear: External ear normal.  Left Ear: External ear normal.  Mouth/Throat: Oropharynx is clear and moist. No oropharyngeal exudate.  Eyes: Conjunctivae and EOM are normal. Pupils are equal, round, and reactive to light. Right eye exhibits no discharge. Left eye exhibits no discharge. No scleral icterus.  Neck: Normal range of motion. Neck supple. No JVD present. No tracheal deviation present. No thyromegaly present.  Cardiovascular: Normal rate, regular rhythm, normal heart sounds and intact distal pulses.  Exam reveals no gallop and no friction rub.   No murmur heard. Pulmonary/Chest: Effort normal and breath sounds normal. No respiratory distress. She has no wheezes. She has no rales. She exhibits no tenderness.  Abdominal: Soft. Bowel sounds are normal. She exhibits no distension and no mass. There is no tenderness. There is no rebound and no guarding.  Musculoskeletal: Normal range of motion. She exhibits no edema and no tenderness.  Lymphadenopathy:    She has no cervical adenopathy.  Neurological: She is alert and oriented to person, place, and time. She has  normal reflexes. No cranial nerve deficit. She exhibits normal muscle tone. Coordination normal.  Skin: Skin is warm and dry. No rash noted. She is not diaphoretic. No erythema. No pallor.  Psychiatric: She has a normal mood and affect. Her behavior is normal. Judgment and thought content normal.          Assessment & Plan:

## 2011-02-25 NOTE — Assessment & Plan Note (Addendum)
PFts 02/25/2011 (first ever)  -fev1 1.31L/55%, 40% post Bd response, TLC 75% and slightly low, DLCO 16/78% and slightly low  Above fits in with Moderate- severe asthma. She is now having daily nocturnal awakening. Uncleear why advair and singulair did not work for her; ? Compliance (Was doing it only 1 per day), ? Large size steroid particile of advair resulting in raspy voice, ? Subjective intolerance. By NIH guidelines she needs LABA  + ICS combo but given issues with steroid particle, I will start her on QVAR alone (use spacer to help reduce risk fo throat issues)  and reasses in 3 weeks with spirometry. AT fu if not substantially improved, change to symbicort or/and add singulair.  Also, consider IgE and allergy wu at fu. Depending on course, can consier CT chest to sort out low dlco  Note: she is neighbors to Dr. Dr Molinda Bailiff of [facility] and apparently he has told her she might need bubble study. AT present I do not see an indication for it. I will follow her closely and then we can decide. She knows to get in touch with [place] if she is not better or getting worse.

## 2011-02-27 ENCOUNTER — Encounter: Payer: Self-pay | Admitting: Internal Medicine

## 2011-03-18 ENCOUNTER — Encounter: Payer: Self-pay | Admitting: Adult Health

## 2011-03-18 ENCOUNTER — Ambulatory Visit (INDEPENDENT_AMBULATORY_CARE_PROVIDER_SITE_OTHER): Payer: 59 | Admitting: Adult Health

## 2011-03-18 VITALS — BP 100/70 | HR 75 | Temp 98.0°F | Ht 65.0 in | Wt 150.2 lb

## 2011-03-18 DIAGNOSIS — J4540 Moderate persistent asthma, uncomplicated: Secondary | ICD-10-CM

## 2011-03-18 DIAGNOSIS — J45901 Unspecified asthma with (acute) exacerbation: Secondary | ICD-10-CM

## 2011-03-18 DIAGNOSIS — J45909 Unspecified asthma, uncomplicated: Secondary | ICD-10-CM

## 2011-03-18 DIAGNOSIS — J449 Chronic obstructive pulmonary disease, unspecified: Secondary | ICD-10-CM

## 2011-03-18 MED ORDER — BECLOMETHASONE DIPROPIONATE 80 MCG/ACT IN AERS: 2.0000 | INHALATION_SPRAY | Freq: Two times a day (BID) | RESPIRATORY_TRACT | Status: DC

## 2011-03-18 MED ORDER — ALBUTEROL 90 MCG/ACT IN AERS: 2.0000 | INHALATION_SPRAY | RESPIRATORY_TRACT | Status: DC | PRN

## 2011-03-18 NOTE — Progress Notes (Signed)
  Subjective:    Patient ID: Tracey Boyd, female    DOB: 02-12-1951, 60 y.o.   MRN: [id number]  HPI OV 02/25/2011: followu for asthma evaluation/diagnosis. PFTs 02/25/2011: shows findings c/w moderate-severe obstruction with BD response though TLC and DLCO are slightly diminished. Fev1 is 1.3/55%, 40% BD resppnse. TLC 75%, DLCO 78%. She is now reporting daily nocturnal awakenings. Not using albuterol for rescue. No other symptoms. States that Dr. Cornelius Moras of [facility] told her she needs bubble study  Follow OV 03/18/2011  Pt returns feeling much better with decreased wheezing and dsypnea. DOE is decreased. Feels she can take in deep breath.  Spirometry today is much improved with FEV1 increased from 55% >>82%. No cough or wheezing    Review of Systems Constitutional:   No  weight loss, night sweats,  Fevers, chills, fatigue, or  lassitude.  HEENT:   No headaches,  Difficulty swallowing,  Tooth/dental problems, or  Sore throat,                No sneezing, itching, ear ache, nasal congestion, post nasal drip,   CV:  No chest pain,  Orthopnea, PND, swelling in lower extremities, anasarca, dizziness, palpitations, syncope.   GI  No heartburn, indigestion, abdominal pain, nausea, vomiting, diarrhea, change in bowel habits, loss of appetite, bloody stools.   Resp: No shortness of breath with exertion or at rest.  No excess mucus, no productive cough,  No non-productive cough,  No coughing up of blood.  No change in color of mucus.  No wheezing.  No chest wall deformity  Skin: no rash or lesions.  GU: no dysuria, change in color of urine, no urgency or frequency.  No flank pain, no hematuria   MS:  No joint pain or swelling.  No decreased range of motion.  No back pain.  Psych:  No change in mood or affect. No depression or anxiety.  No memory loss.         Objective:   Physical Exam GEN: A/Ox3; pleasant , NAD, well nourished   HEENT:  [place]/AT,  EACs-clear, TMs-wnl, NOSE-clear, THROAT-clear, no  lesions, no postnasal drip or exudate noted.   NECK:  Supple w/ fair ROM; no JVD; normal carotid impulses w/o bruits; no thyromegaly or nodules palpated; no lymphadenopathy.  RESP  Clear  P & A; w/o, wheezes/ rales/ or rhonchi.no accessory muscle use, no dullness to percussion  CARD:  RRR, no m/r/g  , no peripheral edema, pulses intact, no cyanosis or clubbing.  GI:   Soft & nt; nml bowel sounds; no organomegaly or masses detected.  Musco: Warm bil, no deformities or joint swelling noted.   Neuro: alert, no focal deficits noted.    Skin: Warm, no lesions or rashes          Assessment & Plan:

## 2011-03-18 NOTE — Patient Instructions (Signed)
Tests show improved lung function  Continue on  QVAR 2 puff twice daily, use with spacer. Use this daily regardless of how you feel  Use Ventolin as needed only-RESCUER   follow up Dr. Marchelle Gearing in 2 months

## 2011-03-27 ENCOUNTER — Telehealth: Payer: Self-pay | Admitting: Internal Medicine

## 2011-03-27 NOTE — Telephone Encounter (Signed)
Spoke with [facility]. They are just needing supervising MD name since rxs were sent per TP. Gave her MR's name and NPI. Nothing further needed.

## 2011-03-27 NOTE — Assessment & Plan Note (Addendum)
Improved control on QVAR Asthma education given   Plan:  Tests show improved lung function  Continue on  QVAR 2 puff twice daily, use with spacer. Use this daily regardless of how you feel  Use Ventolin as needed only-RESCUER   follow up Dr. Marchelle Gearing in 2 months

## 2011-05-20 ENCOUNTER — Ambulatory Visit (INDEPENDENT_AMBULATORY_CARE_PROVIDER_SITE_OTHER): Payer: 59 | Admitting: Internal Medicine

## 2011-05-20 ENCOUNTER — Encounter: Payer: Self-pay | Admitting: Internal Medicine

## 2011-05-20 VITALS — BP 114/78 | HR 63 | Temp 98.0°F | Ht 65.0 in | Wt 153.6 lb

## 2011-05-20 DIAGNOSIS — J45909 Unspecified asthma, uncomplicated: Secondary | ICD-10-CM

## 2011-05-20 DIAGNOSIS — Z23 Encounter for immunization: Secondary | ICD-10-CM

## 2011-05-20 DIAGNOSIS — J4540 Moderate persistent asthma, uncomplicated: Secondary | ICD-10-CM

## 2011-05-20 NOTE — Assessment & Plan Note (Signed)
Clincially improved control though when she was using albuterol at will she felt better. She is a poor perceiver in any event. So, I have advised she return and do spirometry (currently not using albuterol after education it is for prn use only). Depending on level of control, step up Rx or continue same. Advised flu shot today; she has agreed

## 2011-05-20 NOTE — Progress Notes (Signed)
  Subjective:    Patient ID: Tracey Boyd, female    DOB: 1951/04/30, 60 y.o.   MRN: [id number]  HPI OV 02/25/2011: followu for asthma evaluation/diagnosis. PFTs 02/25/2011: shows findings c/w moderate-severe obstruction with BD response though TLC and DLCO are slightly diminished. Fev1 is 1.3/55%, 40% BD resppnse. TLC 75%, DLCO 78%. She is now reporting daily nocturnal awakenings. Not using albuterol for rescue. No other symptoms. States that Dr. Cornelius Moras of [facility] told her she needs bubble study  Follow OV 03/18/2011  Pt returns feeling much better with decreased wheezing and dsypnea. DOE is decreased. Feels she can take in deep breath.  Spirometry today is much improved with FEV1 increased from 55% >>82%. No cough or wheezing   Ov 05/17/11: Followup asthma. SAw NP 2 months ago for followup on response to QVAR. Noted to have improved fev1 to normal but today she states it was done while she was using albuterol at will along with QVAR because she did not realize it was rescue medication. Since then not using albuterol. Feels well and greatly improved. No nocturnal awakenings. No cough. No chest tightness. Effort tolerance though improved compared to June is not as good as when she was using albuterol a  Lot.   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  HENT: Negative.  Negative for ear pain, nosebleeds, congestion, sore throat, rhinorrhea, sneezing, trouble swallowing, dental problem, postnasal drip and sinus pressure.   Eyes: Negative.  Negative for redness and itching.  Respiratory: Negative.  Negative for cough, chest tightness, shortness of breath and wheezing.   Cardiovascular: Negative.  Negative for palpitations and leg swelling.  Gastrointestinal: Negative.  Negative for nausea and vomiting.  Genitourinary: Negative.  Negative for dysuria.  Musculoskeletal:  Negative.  Negative for joint swelling.  Skin: Negative.  Negative for rash.  Neurological: Negative.  Negative for headaches.  Hematological: Negative.  Does not bruise/bleed easily.  Psychiatric/Behavioral: Negative.  Negative for dysphoric mood. The patient is not nervous/anxious.        Objective:   Physical Exam  GEN: A/Ox3; pleasant , NAD, well nourished   HEENT:  [place]/AT,  EACs-clear, TMs-wnl, NOSE-clear, THROAT-clear, no lesions, no postnasal drip or exudate noted.   NECK:  Supple w/ fair ROM; no JVD; normal carotid impulses w/o bruits; no thyromegaly or nodules palpated; no lymphadenopathy.  RESP  Clear  P & A; w/o, wheezes/ rales/ or rhonchi.no accessory muscle use, no dullness to percussion  CARD:  RRR, no m/r/g  , no peripheral edema, pulses intact, no cyanosis or clubbing.  GI:   Soft & nt; nml bowel sounds; no organomegaly or masses detected.  Musco: Warm bil, no deformities or joint swelling noted.   Neuro: alert, no focal deficits noted.    Skin: Warm, no lesions or rashes       Assessment & Plan:

## 2011-05-20 NOTE — Patient Instructions (Signed)
Glad you are better Continue qvar 2 puff twice daily  Use albuterol only as needed To assess true control, return at your convenience, to our office and have simple spirometry done Please let me know once that spirometry is done so I can call you back with plan to continue or step up asthma treatment Have flu shot today Followup in 4-5 months or sooner iif needed

## 2011-06-07 LAB — CBC
HCT: 40.1
Hemoglobin: 13.6
MCV: 91
Platelets: 264
RBC: 4.41
WBC: 6.1

## 2011-09-19 ENCOUNTER — Encounter: Payer: Self-pay | Admitting: Family

## 2011-09-19 ENCOUNTER — Ambulatory Visit (INDEPENDENT_AMBULATORY_CARE_PROVIDER_SITE_OTHER): Payer: 59 | Admitting: Family

## 2011-09-19 VITALS — BP 122/84 | Temp 98.5°F | Wt 152.0 lb

## 2011-09-19 DIAGNOSIS — I10 Essential (primary) hypertension: Secondary | ICD-10-CM

## 2011-09-19 NOTE — Patient Instructions (Signed)

## 2011-09-19 NOTE — Progress Notes (Signed)
Subjective:    Patient ID: Tracey Boyd, female    DOB: [birth year], 61 y.o.   MRN: [id number]  HPI 61 year old white female, patient of Dr. Cato Mulligan is in today with complaints of elevated blood pressure after leaving the chin. Elevated blood pressure was accompanied by a headache. Blood pressure was in the 170s systolically and gradually returned back down to normal. She denies any lightheadedness, dizziness, chest pain, palpitations, shortness of breath, or edema.   Review of Systems  Constitutional: Negative.   HENT: Negative.   Respiratory: Negative.   Cardiovascular: Negative.   Genitourinary: Negative.   Musculoskeletal: Negative.   Skin: Negative.   Neurological: Negative.   Hematological: Negative.   Psychiatric/Behavioral: Negative.    Past Medical History  Diagnosis Date  . Asthma   . GERD (gastroesophageal reflux disease)   . HTN (hypertension)   . Anxiety   . Dermatitis herpetiformis     History   Social History  . Marital Status: Married    Spouse Name: N/A    Number of Children: N/A  . 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 Smoking status: Passive Smoker  . Smokeless tobacco: Never Used   Comment: pt states both parents were smokers  . Alcohol Use: No  . Drug Use: No  . Sexually Active: Not on file   Other Topics Concern  . Not on file   Social History Narrative  . No narrative on file    Past Surgical History  Procedure Date  . Appendectomy   . Cystectomy     Family History  Problem Relation Age of Onset  . Alcohol abuse Father   . Multiple sclerosis Mother   . Lung cancer Mother   . Multiple sclerosis Sister     Allergies  Allergen Reactions  . Ace Inhibitors     REACTION: cough (lisinopril)  . Penicillins     REACTION: hives  . Prednisone     REACTION: paresthesias  . Sulfonamide Derivatives     REACTION: hives    Current Outpatient  Prescriptions on File Prior to Visit  Medication Sig Dispense Refill  . albuterol (PROVENTIL HFA) 108 (90 BASE) MCG/ACT inhaler Inhale 2 puffs into the lungs every 6 (six) hours as needed.        Marland Kitchen aspirin 81 MG tablet Take 81 mg by mouth daily.        . beclomethasone (QVAR) 80 MCG/ACT inhaler Inhale 2 puffs into the lungs 2 (two) times daily.  3 Inhaler  3  . calcium carbonate (OS-CAL) 600 MG TABS Take 600 mg by mouth 2 (two) times daily with a meal.        . cyclobenzaprine (FLEXERIL) 5 MG tablet Take 5 mg by mouth 3 (three) times daily as needed.        Marland Kitchen HYDROcodone-acetaminophen (NORCO) 5-325 MG per tablet Take 1 tablet by mouth every 6 (six) hours as needed.        Marland Kitchen ibuprofen (ADVIL,MOTRIN) 600 MG tablet Take 600 mg by mouth every 6 (six) hours as needed.          BP 122/84  Temp(Src) 98.5 F (36.9 C) (Oral)  Wt 152 lb (68.947 kg)chart   Objective:   Physical Exam  Constitutional: She is oriented to person, place, and time. She appears well-developed and well-nourished.  Neck: Normal range of motion. Neck supple.  Cardiovascular: Normal rate  and regular rhythm.   Pulmonary/Chest: Effort normal and breath sounds normal.  Abdominal: Soft. Bowel sounds are normal.  Neurological: She is alert and oriented to person, place, and time.  Skin: Skin is warm and dry.  Psychiatric: She has a normal mood and affect.    BP recheck: 108/74      Assessment & Plan:  Assessment: Hypertension  Plan: Her blood pressures been stable over the last several office visits and even today. She has no indication to start antihypertensive agents. Continue exercise, healthy diet. Patient will start the blood pressure diary and will report these to worsen one week. Call the office if symptoms worsen or persist recheck or sooner when necessary.

## 2011-09-20 ENCOUNTER — Ambulatory Visit (INDEPENDENT_AMBULATORY_CARE_PROVIDER_SITE_OTHER): Payer: 59 | Admitting: Internal Medicine

## 2011-09-20 ENCOUNTER — Encounter: Payer: Self-pay | Admitting: Internal Medicine

## 2011-09-20 DIAGNOSIS — F411 Generalized anxiety disorder: Secondary | ICD-10-CM

## 2011-09-20 DIAGNOSIS — I10 Essential (primary) hypertension: Secondary | ICD-10-CM

## 2011-09-20 MED ORDER — AMLODIPINE BESYLATE 2.5 MG PO TABS: 2.5000 mg | ORAL_TABLET | Freq: Every day | ORAL | Status: DC

## 2011-09-20 NOTE — Progress Notes (Signed)
  Subjective:    Patient ID: Tracey Boyd, female    DOB: November 25, 1950, 61 y.o.   MRN: [id number]  HPI 61 year old patient who has a history of asthma anxiety disorder and also prior history of treated hypertension. She has had substantial weight loss and amlodipine has been discontinued. More recently she has had some very labile blood pressure readings some quite elevated. She was seen yesterday with her blood pressure concerns and blood pressure was normal. Blood pressure arrival here today 118/70. She took a single elevated amlodipine yesterday due to a blood pressure reading of 180/120.  She uses a hand-held manometer with stethoscope for blood pressure determinations   Review of Systems     Objective:   Physical Exam  Constitutional:       Blood pressure on arrival 118/70 Repeat blood pressure 124/84  Cardiovascular: Normal rate, regular rhythm and normal heart sounds.   Pulmonary/Chest: Effort normal and breath sounds normal.  Musculoskeletal: She exhibits no edema.          Assessment & Plan:   Labile hypertension. Options were discussed. We'll resume amlodipine at a 2.5 mg dose. We'll continue a restricted salt diet exercise regimen and followup in 6 weeks. Will continue home blood pressure monitoring

## 2011-09-20 NOTE — Patient Instructions (Signed)
Limit your sodium (Salt) intake    It is important that you exercise regularly, at least 20 minutes 3 to 4 times per week.  If you develop chest pain or shortness of breath seek  medical attention.  Please check your blood pressure on a regular basis.  If it is consistently greater than 150/90, please make an office appointment.  

## 2011-09-23 ENCOUNTER — Encounter: Payer: Self-pay | Admitting: Internal Medicine

## 2011-09-23 ENCOUNTER — Telehealth: Payer: Self-pay | Admitting: Internal Medicine

## 2011-09-23 NOTE — Telephone Encounter (Signed)
This was done Friday

## 2011-09-23 NOTE — Telephone Encounter (Signed)
Pt needs a script for amLODipine (NORVASC) 2.5 MG tablet to send to [facility] order.

## 2011-09-27 ENCOUNTER — Telehealth: Payer: Self-pay | Admitting: *Deleted

## 2011-09-27 MED ORDER — CIPROFLOXACIN HCL 250 MG PO TABS: 250.0000 mg | ORAL_TABLET | Freq: Two times a day (BID) | ORAL | Status: AC

## 2011-09-27 NOTE — Telephone Encounter (Signed)
(  triage voicemail)  Pt has uti, usually gets one annually.  Requesting to have rx called in to [facility] at [address] and [address].  Unable to leave house due to snow, [place]

## 2011-09-27 NOTE — Telephone Encounter (Signed)
Notified pt. 

## 2011-09-27 NOTE — Telephone Encounter (Signed)
If this message is correct .Marland Kitchen Call in cipro 250 mg po bid for 5 days

## 2011-09-27 NOTE — Telephone Encounter (Signed)
Error

## 2011-10-14 ENCOUNTER — Other Ambulatory Visit: Payer: Self-pay | Admitting: Internal Medicine

## 2011-10-14 MED ORDER — AMLODIPINE BESYLATE 2.5 MG PO TABS: 2.5000 mg | ORAL_TABLET | Freq: Every day | ORAL | Status: DC

## 2011-10-14 NOTE — Telephone Encounter (Signed)
rx sent in electronically 

## 2011-10-14 NOTE — Telephone Encounter (Signed)
Pt need amlodipine 2.5mg  #90 sent to [facility]. [facility] never received order

## 2011-11-01 ENCOUNTER — Encounter: Payer: Self-pay | Admitting: Internal Medicine

## 2011-11-01 ENCOUNTER — Ambulatory Visit (INDEPENDENT_AMBULATORY_CARE_PROVIDER_SITE_OTHER): Payer: 59 | Admitting: Internal Medicine

## 2011-11-01 ENCOUNTER — Ambulatory Visit (AMBULATORY_SURGERY_CENTER): Payer: 59 | Admitting: *Deleted

## 2011-11-01 VITALS — Ht 64.0 in | Wt 150.0 lb

## 2011-11-01 DIAGNOSIS — Z1211 Encounter for screening for malignant neoplasm of colon: Secondary | ICD-10-CM

## 2011-11-01 DIAGNOSIS — Z2911 Encounter for prophylactic immunotherapy for respiratory syncytial virus (RSV): Secondary | ICD-10-CM

## 2011-11-01 DIAGNOSIS — I10 Essential (primary) hypertension: Secondary | ICD-10-CM

## 2011-11-01 MED ORDER — PEG-KCL-NACL-NASULF-NA ASC-C 100 G PO SOLR: ORAL | Status: DC

## 2011-11-01 NOTE — Progress Notes (Signed)
Patient ID: Tracey Boyd, female   DOB: 1951-08-31, 61 y.o.   MRN: [id number] Htn: home bps 90-120/60-70 She has no side effects on meds  Past Medical History  Diagnosis Date  . Asthma   . GERD (gastroesophageal reflux disease)   . HTN (hypertension)   . Anxiety   . Dermatitis herpetiformis     History   Social History  . Marital Status: Married    Spouse Name: N/A    Number of Children: N/A  . 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 Smoking status: Passive Smoker  . Smokeless tobacco: Never Used   Comment: pt states both parents were smokers  . Alcohol Use: No  . Drug Use: No  . Sexually Active: Not on file   Other Topics Concern  . Not on file   Social History Narrative  . No narrative on file    Past Surgical History  Procedure Date  . Appendectomy   . Cystectomy     Family History  Problem Relation Age of Onset  . Alcohol abuse Father   . Multiple sclerosis Mother   . Lung cancer Mother   . Multiple sclerosis Sister     Allergies  Allergen Reactions  . Ace Inhibitors     REACTION: cough (lisinopril)  . Penicillins     REACTION: hives  . Prednisone     REACTION: paresthesias  . Sulfonamide Derivatives     REACTION: hives    Current Outpatient Prescriptions on File Prior to Visit  Medication Sig Dispense Refill  . amLODipine (NORVASC) 2.5 MG tablet Take 1 tablet (2.5 mg total) by mouth daily.  90 tablet  3  . aspirin 81 MG tablet Take 81 mg by mouth daily.        . beclomethasone (QVAR) 80 MCG/ACT inhaler Inhale 2 puffs into the lungs 2 (two) times daily.  3 Inhaler  3  . calcium carbonate (OS-CAL) 600 MG TABS Take 600 mg by mouth 2 (two) times daily with a meal.           patient denies chest pain, shortness of breath, orthopnea. Denies lower extremity edema, abdominal pain, change in appetite, change in bowel movements. Patient denies rashes, musculoskeletal  complaints. No other specific complaints in a complete review of systems.   BP 100/80  Pulse 78  Temp(Src) 98.3 F (36.8 C) (Oral)  Wt 151 lb (68.493 kg)  SpO2 98%  Well-developed well-nourished female in no acute distress. HEENT exam atraumatic, normocephalic, extraocular muscles are intact. Neck is supple. No jugular venous distention no thyromegaly. Chest clear to auscultation without increased work of breathing. Cardiac exam S1 and S2 are regular. Abdominal exam active bowel sounds, soft, nontender.

## 2011-11-04 NOTE — Assessment & Plan Note (Signed)
BP Readings from Last 3 Encounters:  11/01/11 100/80  09/20/11 118/70  09/19/11 122/84   Controlled Continue same meds

## 2011-11-08 ENCOUNTER — Encounter: Payer: Self-pay | Admitting: Internal Medicine

## 2011-11-08 ENCOUNTER — Ambulatory Visit (AMBULATORY_SURGERY_CENTER): Payer: 59 | Admitting: Internal Medicine

## 2011-11-08 VITALS — BP 120/78 | HR 69 | Temp 98.4°F | Resp 15 | Ht 64.0 in | Wt 150.0 lb

## 2011-11-08 DIAGNOSIS — Z1211 Encounter for screening for malignant neoplasm of colon: Secondary | ICD-10-CM

## 2011-11-08 MED ORDER — SODIUM CHLORIDE 0.9 % IV SOLN: 500.0000 mL | INTRAVENOUS | Status: DC

## 2011-11-08 NOTE — Patient Instructions (Signed)
YOU HAD AN ENDOSCOPIC PROCEDURE TODAY AT THE [facility]: Refer to the procedure report that was given to you for any specific questions about what was found during the examination.  If the procedure report does not answer your questions, please call your gastroenterologist to clarify.  If you requested that your care partner not be given the details of your procedure findings, then the procedure report has been included in a sealed envelope for you to review at your convenience later.  YOU SHOULD EXPECT: Some feelings of bloating in the abdomen. Passage of more gas than usual.  Walking can help get rid of the air that was put into your GI tract during the procedure and reduce the bloating. If you had a lower endoscopy (such as a colonoscopy or flexible sigmoidoscopy) you may notice spotting of blood in your stool or on the toilet paper. If you underwent a bowel prep for your procedure, then you may not have a normal bowel movement for a few days.  DIET: Your first meal following the procedure should be a light meal and then it is ok to progress to your normal diet.  A half-sandwich or bowl of soup is an example of a good first meal.  Heavy or fried foods are harder to digest and may make you feel nauseous or bloated.  Likewise meals heavy in dairy and vegetables can cause extra gas to form and this can also increase the bloating.  Drink plenty of fluids but you should avoid alcoholic beverages for 24 hours.  ACTIVITY: Your care partner should take you home directly after the procedure.  You should plan to take it easy, moving slowly for the rest of the day.  You can resume normal activity the day after the procedure however you should NOT DRIVE or use heavy machinery for 24 hours (because of the sedation medicines used during the test).    SYMPTOMS TO REPORT IMMEDIATELY: A gastroenterologist can be reached at any hour.  During normal business hours, 8:30 AM to 5:00 PM Monday through Friday,  call [phone].  After hours and on weekends, please call the GI answering service at [phone] who will take a message and have the physician on call contact you.   Following lower endoscopy (colonoscopy or flexible sigmoidoscopy):  Excessive amounts of blood in the stool  Significant tenderness or worsening of abdominal pains  Swelling of the abdomen that is new, acute  Fever of 100F or higher  FOLLOW UP: Our staff will call the home number listed on your records the next business day following your procedure to check on you and address any questions or concerns that you may have at that time regarding the information given to you following your procedure. This is a courtesy call and so if there is no answer at the home number and we have not heard from you through the emergency physician on call, we will assume that you have returned to your regular daily activities without incident.  SIGNATURES/CONFIDENTIALITY: You and/or your care partner have signed paperwork which will be entered into your electronic medical record.  These signatures attest to the fact that that the information above on your After Visit Summary has been reviewed and is understood.  Full responsibility of the confidentiality of this discharge information lies with you and/or your care-partner.   Follow up colonoscopy in 10 years  Follow a high fiber diet- see handout

## 2011-11-08 NOTE — Progress Notes (Signed)
Propofol was administered by Sheila Camp, CRNA. Maw  The pt tolerated the colonoscopy very well. Maw   

## 2011-11-08 NOTE — Progress Notes (Signed)
Patient did not experience any of the following events: a burn prior to discharge; a fall within the facility; wrong site/side/patient/procedure/implant event; or a hospital transfer or hospital admission upon discharge from the facility. (G8907) Patient did not have preoperative order for IV antibiotic SSI prophylaxis. (G8918)  

## 2011-11-08 NOTE — Op Note (Signed)
[facility] [address] [place], [place]  [address]  COLONOSCOPY PROCEDURE REPORT  PATIENT:  Tracey Boyd, Tracey Boyd  MR#:  [id number] BIRTHDATE:  09/15/1950, 60 yrs. old  GENDER:  female ENDOSCOPIST:  Hedwig Morton. Juanda Chance, MD REF. BY:  Birdie Sons, M.D. PROCEDURE DATE:  11/08/2011 PROCEDURE:  Colonoscopy 62[address] ASA CLASS:  Class I INDICATIONS:  colorectal cancer screening, average risk, family Hx of polyps last colo 2003 was normal MEDICATIONS:   MAC sedation, administered by CRNA, propofol (Diprivan) 350 mg  DESCRIPTION OF PROCEDURE:   After the risks and benefits and of the procedure were explained, informed consent was obtained. Digital rectal exam was performed and revealed no rectal masses. The  endoscope was introduced through the anus and advanced to the cecum, which was identified by both the appendix and ileocecal valve.  The quality of the prep was good, using MoviPrep.  The instrument was then slowly withdrawn as the colon was fully examined. <<PROCEDUREIMAGES>>  FINDINGS:  No polyps or cancers were seen (see image1, image2, image4, image5, image6, and image7). mildly dilated hypotonic colon   Retroflexed views in the rectum revealed no abnormalities. The scope was then withdrawn from the patient and the procedure completed.  COMPLICATIONS:  None ENDOSCOPIC IMPRESSION: 1) No polyps or cancers 2) Normal colonoscopy RECOMMENDATIONS: 1) High fiber diet.  REPEAT EXAM:  In 10 year(s) for.  ______________________________ Hedwig Morton. Juanda Chance, MD  CC:  n. eSIGNED:   Hedwig Morton. Coen Miyasato at 11/08/2011 12:45 PM  Cheral Marker, [phone]

## 2011-11-11 ENCOUNTER — Telehealth: Payer: Self-pay

## 2011-11-11 NOTE — Telephone Encounter (Signed)
  Follow up Call-  Call back number 11/08/2011  Post procedure Call Back phone  # [phone]  Permission to leave phone message Yes     Patient questions:  Do you have a fever, pain , or abdominal swelling? no Pain Score  0 *  Have you tolerated food without any problems? yes  Have you been able to return to your normal activities? yes  Do you have any questions about your discharge instructions: Diet   no Medications  no Follow up visit  no  Do you have questions or concerns about your Care? no  Actions: * If pain score is 4 or above: No action needed, pain <4.

## 2011-11-27 ENCOUNTER — Ambulatory Visit (INDEPENDENT_AMBULATORY_CARE_PROVIDER_SITE_OTHER): Payer: 59 | Admitting: Family

## 2011-11-27 ENCOUNTER — Encounter: Payer: Self-pay | Admitting: Family

## 2011-11-27 VITALS — BP 114/80 | Temp 98.2°F | Wt 150.0 lb

## 2011-11-27 DIAGNOSIS — K122 Cellulitis and abscess of mouth: Secondary | ICD-10-CM

## 2011-11-27 DIAGNOSIS — J029 Acute pharyngitis, unspecified: Secondary | ICD-10-CM

## 2011-11-27 LAB — POCT RAPID STREP A (OFFICE): Rapid Strep A Screen: NEGATIVE

## 2011-11-27 MED ORDER — AZITHROMYCIN 250 MG PO TABS: ORAL_TABLET | ORAL | Status: AC

## 2011-11-27 NOTE — Patient Instructions (Signed)
Uvulitis Uvulitis is redness and soreness (inflammation) of the uvula. The uvula is the small tongue-shaped piece of tissue in the back of your mouth.  CAUSES Infection is a common cause of uvulitis. Infection of the uvula can be either viral or bacterial. Infectious uvulitis usually only occurs in association with another condition, such as inflammation and infection of the mouth or throat. Other causes of uvulitis include:  Trauma to the uvula.   Swelling from excess fluid buildup (edema), which may be an allergic reaction.   Inhalation of irritants, such as chemical agents, smoke, or steam.  DIAGNOSIS Your caregiver can usually diagnose uvulitis through a physical examination. Bacterial uvulitis can be diagnosed through the results of the growth of samples of bodily substances taken from your mouth (cultures). HOME CARE INSTRUCTIONS   Rest as much as possible.   Young children may suck on frozen juice bars or frozen ice pops. Older children and adults may gargle with a warm or cold liquid to help soothe the throat. (Mix  tsp of salt in 8 oz of water, or use strong tea.)   Use a cool-mist humidifier to lessen throat irritation and cough.   Drink enough fluids to keep your urine clear or pale yellow.   While the throat is very sore, eat soft or liquid foods such as milk, ice cream, soups, or milk drinks.   Family members who develop a sore throat or fever should have a medical exam or throat culture.   If your child has uvulitis and is taking antibiotic medicine, wait 24 hours or until his or her temperature is near normal (less than 100 F [37.8 C]) before allowing him or her to return to school or day care.   Only take over-the-counter or prescription medicines for pain, discomfort, or fever as directed by your caregiver.  Ask when your test results will be ready. Make sure you get your test results. SEEK MEDICAL CARE IF:   You have an oral temperature above 102 F (38.9 C).     You develop large, tender lumps your the neck.   Your child develops a rash.   You cough up green, yellow-brown, or bloody substances.  SEEK IMMEDIATE MEDICAL CARE IF:   You develop any new symptoms, such as vomiting, earache, severe headache, stiff neck, chest pain, or trouble breathing or swallowing.   Your airway is blocked.   You develop more severe throat pain along with drooling or voice changes.  Document Released: 04/05/2004 Document Revised: 08/15/2011 Document Reviewed: 11/01/2010 [facility] Patient Information 2012 [place], [place].  Pharyngitis, Viral and Bacterial Pharyngitis is soreness (inflammation) or infection of the pharynx. It is also called a sore throat. CAUSES  Most sore throats are caused by viruses and are part of a cold. However, some sore throats are caused by strep and other bacteria. Sore throats can also be caused by post nasal drip from draining sinuses, allergies and sometimes from sleeping with an open mouth. Infectious sore throats can be spread from person to person by coughing, sneezing and sharing cups or eating utensils. TREATMENT  Sore throats that are viral usually last 3-4 days. Viral illness will get better without medications (antibiotics). Strep throat and other bacterial infections will usually begin to get better about 24-48 hours after you begin to take antibiotics. HOME CARE INSTRUCTIONS   If the caregiver feels there is a bacterial infection or if there is a positive strep test, they will prescribe an antibiotic. The full course of antibiotics must  be taken. If the full course of antibiotic is not taken, you or your child may become ill again. If you or your child has strep throat and do not finish all of the medication, serious heart or kidney diseases may develop.   Drink enough water and fluids to keep your urine clear or pale yellow.   Only take over-the-counter or prescription medicines for pain, discomfort or fever as directed by your  caregiver.   Get lots of rest.   Gargle with salt water ( tsp. of salt in a glass of water) as often as every 1-2 hours as you need for comfort.   Hard candies may soothe the throat if individual is not at risk for choking. Throat sprays or lozenges may also be used.  SEEK MEDICAL CARE IF:   Large, tender lumps in the neck develop.   A rash develops.   Green, yellow-brown or bloody sputum is coughed up.   Your baby is older than 3 months with a rectal temperature of 100.5 F (38.1 C) or higher for more than 1 day.  SEEK IMMEDIATE MEDICAL CARE IF:   A stiff neck develops.   You or your child are drooling or unable to swallow liquids.   You or your child are vomiting, unable to keep medications or liquids down.   You or your child has severe pain, unrelieved with recommended medications.   You or your child are having difficulty breathing (not due to stuffy nose).   You or your child are unable to fully open your mouth.   You or your child develop redness, swelling, or severe pain anywhere on the neck.   You have a fever.   Your baby is older than 3 months with a rectal temperature of 102 F (38.9 C) or higher.   Your baby is 36 months old or younger with a rectal temperature of 100.4 F (38 C) or higher.  MAKE SURE YOU:   Understand these instructions.   Will watch your condition.   Will get help right away if you are not doing well or get worse.  Document Released: 08/26/2005 Document Revised: 08/15/2011 Document Reviewed: 11/23/2007 [facility] Patient Information 2012 [place], [place].

## 2011-11-27 NOTE — Progress Notes (Signed)
Subjective:    Patient ID: Tracey Boyd, female    DOB: [birth year], 61 y.o.   MRN: [id number]  Sore Throat  This is a new problem. The current episode started 1 to 4 weeks ago. The problem has been unchanged. There has been no fever. The pain is moderate. Associated symptoms include congestion, coughing, a plugged ear sensation and swollen glands. She has had exposure to strep. She has tried NSAIDs (Mucinex) for the symptoms. The treatment provided mild relief.      Review of Systems  Constitutional: Negative.   HENT: Positive for congestion, sore throat and postnasal drip.   Respiratory: Positive for cough.   Cardiovascular: Negative.   Musculoskeletal: Negative.   Skin: Negative.   Neurological: Negative.   Hematological: Negative.   Psychiatric/Behavioral: Negative.    Past Medical History  Diagnosis Date  . Asthma   . GERD (gastroesophageal reflux disease)   . HTN (hypertension)   . Anxiety   . Dermatitis herpetiformis     History   Social History  . Marital Status: Married    Spouse Name: N/A    Number of Children: N/A  . 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 Smoking status: Never Smoker   . Smokeless tobacco: Never Used   Comment: pt states both parents were smokers  . Alcohol Use: No  . Drug Use: No  . Sexually Active: Not on file   Other Topics Concern  . Not on file   Social History Narrative  . No narrative on file    Past Surgical History  Procedure Date  . Appendectomy   . Cystectomy     Family History  Problem Relation Age of Onset  . Alcohol abuse Father   . Multiple sclerosis Mother   . Lung cancer Mother   . Colon polyps Mother   . Multiple sclerosis Sister   . Colon cancer Neg Hx     Allergies  Allergen Reactions  . Ace Inhibitors     REACTION: cough (lisinopril)  . Penicillins     REACTION: hives  . Prednisone     REACTION: paresthesias  .  Sulfonamide Derivatives     REACTION: hives    Current Outpatient Prescriptions on File Prior to Visit  Medication Sig Dispense Refill  . albuterol (PROAIR HFA) 108 (90 BASE) MCG/ACT inhaler Inhale 2 puffs into the lungs every 6 (six) hours as needed.      Marland Kitchen amLODipine (NORVASC) 2.5 MG tablet Take 1 tablet (2.5 mg total) by mouth daily.  90 tablet  3  . aspirin 81 MG tablet Take 81 mg by mouth daily.        . beclomethasone (QVAR) 80 MCG/ACT inhaler Inhale 2 puffs into the lungs 2 (two) times daily.  3 Inhaler  3  . calcium carbonate (OS-CAL) 600 MG TABS Take 600 mg by mouth 2 (two) times daily with a meal.        . Multiple Vitamin (MULTIVITAMIN) tablet Take 1 tablet by mouth daily.        BP 114/80  Temp(Src) 98.2 F (36.8 C) (Oral)  Wt 150 lb (68.04 kg)chart    Objective:   Physical Exam  Constitutional: She is oriented to person, place, and time. She appears well-developed and well-nourished.  HENT:  Right Ear: External ear normal.  Left Ear: External ear normal.  Nose: Nose normal.  In the pharynx is moderately red. Uvula edematous   Neck: Normal range of motion.  Cardiovascular: Normal rate, regular rhythm and normal heart sounds.   Pulmonary/Chest: Effort normal and breath sounds normal.  Musculoskeletal: Normal range of motion.  Neurological: She is alert and oriented to person, place, and time.  Skin: Skin is warm and dry.  Psychiatric: She has a normal mood and affect.          Assessment & Plan:  Assessment: Sore throat, uvulitis  Plan: Because of her exposure to strep and because of her edematous uvula, who was treated her with a Z-Pak as directed. Patient to call the office if symptoms worsen or persist. Rest. Drink plenty of fluids. Recheck as scheduled and when necessary.  heAnd

## 2012-03-11 ENCOUNTER — Other Ambulatory Visit: Payer: Self-pay | Admitting: Adult Health

## 2012-03-16 ENCOUNTER — Encounter: Payer: Self-pay | Admitting: Internal Medicine

## 2012-03-16 ENCOUNTER — Ambulatory Visit (INDEPENDENT_AMBULATORY_CARE_PROVIDER_SITE_OTHER): Payer: 59 | Admitting: Internal Medicine

## 2012-03-16 VITALS — BP 122/82 | HR 70 | Temp 97.6°F | Ht 65.0 in | Wt 150.4 lb

## 2012-03-16 DIAGNOSIS — J45901 Unspecified asthma with (acute) exacerbation: Secondary | ICD-10-CM

## 2012-03-16 DIAGNOSIS — J45909 Unspecified asthma, uncomplicated: Secondary | ICD-10-CM

## 2012-03-16 MED ORDER — FLUTICASONE-SALMETEROL 100-50 MCG/DOSE IN AEPB: 1.0000 | INHALATION_SPRAY | Freq: Two times a day (BID) | RESPIRATORY_TRACT | Status: DC

## 2012-03-16 MED ORDER — DOXYCYCLINE HYCLATE 100 MG PO TABS: ORAL_TABLET | ORAL | Status: DC

## 2012-03-16 MED ORDER — METHYLPREDNISOLONE 8 MG PO TABS: ORAL_TABLET | ORAL | Status: DC

## 2012-03-16 NOTE — Progress Notes (Signed)
Subjective:    Patient ID: Tracey Boyd, female    DOB: [birth year], 61 y.o.   MRN: [id number]  HPI OV 02/25/2011: followu for asthma evaluation/diagnosis. PFTs 02/25/2011: shows findings c/w moderate-severe obstruction with BD response though TLC and DLCO are slightly diminished. Fev1 is 1.3/55%, 40% BD resppnse. TLC 75%, DLCO 78%. She is now reporting daily nocturnal awakenings. Not using albuterol for rescue. No other symptoms. States that Dr. Cornelius Moras of [facility] told her she needs bubble study  Follow OV 03/18/2011  Pt returns feeling much better with decreased wheezing and dsypnea. DOE is decreased. Feels she can take in deep breath.  Spirometry today is much improved with FEV1 increased from 55% >>82%. No cough or wheezing   Ov 05/17/11: Followup asthma. SAw NP 2 months ago for followup on response to QVAR. Noted to have improved fev1 to normal but today she states it was done while she was using albuterol at will along with QVAR because she did not realize it was rescue medication. Since then not using albuterol. Feels well and greatly improved. No nocturnal awakenings. No cough. No chest tightness. Effort tolerance though improved compared to June is not as good as when she was using albuterol a  Lot.   Social: preparing for her golf charity event Fam hx: no changes Past medical: No changes  Glad you are better  Continue qvar 2 puff twice daily  Use albuterol only as needed  To assess true control, return at your convenience, to our office and have simple spirometry done  Please let me know once that spirometry is done so I can call you back with plan to continue or step up asthma treatment  Have flu shot today  Followup in 4-5 months or sooner iif needed  OV 03/16/2012 Followup moderate-mild persistent asthma.  This is 9 month followup. She was supposed to hv come 5 months ago but missed appt. PAst few days, unwell with viral URI symptoms and increased cough, chest tightness and wheeze and  increased albuterol use and nocturnal awakenings. She insists she is compliant with QVAR  2 puff twice daily. Spirometry - fev1:  1.5L/61%, Ratio 73 while better than baseline is worse than best. OVerall symptom severity is moderate and there no clear cut aggravating or relieving factors. Denies associate chest pain or sputum or edema  Past, Family, Social reviewed: no change since last visit      Review of Systems  Constitutional: Positive for fever. Negative for unexpected weight change.  HENT: Positive for sore throat. Negative for ear pain, nosebleeds, congestion, rhinorrhea, sneezing, dental problem, postnasal drip and sinus pressure.   Eyes: Negative for redness and itching.  Respiratory: Positive for cough, chest tightness, shortness of breath and wheezing.   Cardiovascular: Negative for palpitations and leg swelling.  Gastrointestinal: Negative for nausea and vomiting.  Genitourinary: Negative for dysuria.  Musculoskeletal: Negative for joint swelling.  Skin: Negative for rash.  Neurological: Negative for headaches.  Hematological: Does not bruise/bleed easily.  Psychiatric/Behavioral: Negative for dysphoric mood. The patient is not nervous/anxious.        Objective:   Physical Exam GEN: A/Ox3; pleasant , NAD, well nourished   HEENT:  [place]/AT,  EACs-clear, TMs-wnl, NOSE-clear, THROAT-clear, no lesions, no postnasal drip or exudate noted.   NECK:  Supple w/ fair ROM; no JVD; normal carotid impulses w/o bruits; no thyromegaly or nodules palpated; no lymphadenopathy.  RESP  Clear  P & A; w/o, wheezes/ rales/ or rhonchi.no accessory muscle use, no dullness  to percussion  CARD:  RRR, no m/r/g  , no peripheral edema, pulses intact, no cyanosis or clubbing.  GI:   Soft & nt; nml bowel sounds; no organomegaly or masses detected.  Musco: Warm bil, no deformities or joint swelling noted.   Neuro: alert, no focal deficits noted.    Skin: Warm, no lesions or rashes         Assessment & Plan:

## 2012-03-16 NOTE — Patient Instructions (Addendum)
You have flare or attack of athma called asthma exacerbation Please take doxycycline 100mg  twice daily after meals x 5 days; avoid sunlight Please take methyl-prednisolone 24mg  once daily x 3 days, then 16mg  once daily x 3 days, then 8mg  once daily x 3 days, and stop   - any side effects please call immediately Please stop QVAR Start advair 100/50 , 1 puff twice daily  - take sample and script REturn in 2 months with spirometry at return If you are not better or getting worse at any point call 911 or our  Number [phone] anytime

## 2012-03-19 ENCOUNTER — Other Ambulatory Visit: Payer: Self-pay | Admitting: Obstetrics and Gynecology

## 2012-03-20 DIAGNOSIS — J45901 Unspecified asthma with (acute) exacerbation: Secondary | ICD-10-CM | POA: Insufficient documentation

## 2012-03-20 NOTE — Assessment & Plan Note (Signed)
You have flare or attack of athma called asthma exacerbation Please take doxycycline 100mg  twice daily after meals x 5 days; avoid sunlight Please take methyl-prednisolone 24mg  once daily x 3 days, then 16mg  once daily x 3 days, then 8mg  once daily x 3 days, and stop   - any side effects please call immediately  -we are doing methil prednisolone over concern of side effect for her in prednisone Please stop QVAR Start advair 100/50 , 1 puff twice daily  - take sample and script REturn in 2 months with spirometry at return If you are not better or getting worse at any point call 911 or our  Number [phone] anytime

## 2012-04-02 ENCOUNTER — Telehealth: Payer: Self-pay | Admitting: Internal Medicine

## 2012-04-02 MED ORDER — FLUTICASONE-SALMETEROL 100-50 MCG/DOSE IN AEPB: 1.0000 | INHALATION_SPRAY | Freq: Two times a day (BID) | RESPIRATORY_TRACT | Status: DC

## 2012-04-02 NOTE — Telephone Encounter (Signed)
Spoke with pt to verify the msg. Rx was sent to [facility] and nothing further needed.

## 2012-05-07 ENCOUNTER — Telehealth: Payer: Self-pay | Admitting: Internal Medicine

## 2012-05-07 NOTE — Telephone Encounter (Signed)
Caller: Gudrun/Patient; Patient Name: Tracey Boyd; PCP: Birdie Sons (Adults only); Best Callback Phone Number: [phone]  Pt was feeling lightheaded earlier and took her Blood pressure. Her reading was 86/60. Pt states when she checks her blood pressure in the mornings before taking her medicine it may only be 90's / 60's. She is currently on Cipro for a bladder infection. Emergent s/s of Hypertension protocol r/o. Call provider within 8 hrs. Message sent. Pt would like to know if Amlodopine 2.5mg  needs to be adjusted.

## 2012-05-08 NOTE — Telephone Encounter (Signed)
Per Dr. Clent Ridges, pt should take 1/2 tab of the norvasc thru the weekend and f/u with PCP next week.  Left detailed message on personally identified voicemail to notify pt of message. Advised pt to call with questions or concerns and to schedule f/u appointment next week

## 2012-05-08 NOTE — Telephone Encounter (Signed)
Patient calling today 05/08/12 regarding, called in yesterday (see previous traige note 05/07/12 at 4:25 PM), said office never called her back.  This am blood pressure was 103/80 heart rate 111.  Patient is still taking the Cipro for bladder infection.  Wants to know if she should take 1/2 of the Amlodopine 2.5 mg or if she should skip a day.  Emergent symptoms ruled out by Hypertension, Diagnosed or Suspected guidelines with exception of sudden drop in blood pressure and recent change in prescription, non prescription or alternative medications or their dosages.  (Call Provider Within 8 Hours). OFFICE PLEASE CALL PATIENT BACK AT [phone] IF NO ANSWER CALL [phone].  PATIENT CONCERNED BECAUSE OFFICE NEVER CALLED BACK YESTERDAY.

## 2012-05-13 ENCOUNTER — Ambulatory Visit (INDEPENDENT_AMBULATORY_CARE_PROVIDER_SITE_OTHER): Payer: 59 | Admitting: Family

## 2012-05-13 ENCOUNTER — Telehealth: Payer: Self-pay | Admitting: Internal Medicine

## 2012-05-13 ENCOUNTER — Encounter: Payer: Self-pay | Admitting: Family

## 2012-05-13 VITALS — BP 90/74 | HR 95 | Temp 97.8°F | Wt 152.0 lb

## 2012-05-13 DIAGNOSIS — I10 Essential (primary) hypertension: Secondary | ICD-10-CM

## 2012-05-13 NOTE — Telephone Encounter (Signed)
Per Padonda, pt should sit down, relax and don't take BP meds. Advised pt to continue to monitor BP every morning and if BP is high she should take meds if it is low, then she shouldn't. Pt aware and verbalized understanding

## 2012-05-13 NOTE — Telephone Encounter (Signed)
Pt called and said that bp is 83/62 without taking in bp med. What should pt do when bp drops that low?

## 2012-05-13 NOTE — Progress Notes (Signed)
Subjective:    Patient ID: Tracey Boyd, female    DOB: 1951-07-15, 61 y.o.   MRN: [id number]  HPI 61 year old white female, nonsmoker, patient of Dr. Cato Mulligan is in for recheck of hypertension. For the last several months she's been on and off Norvasc 2.5 mg once daily. Her blood pressure readings we'll try down into the low 90s over 60s and she'll begin to have lightheadedness and dizziness. When the medication was discontinued, her blood pressure shoots up to the 170s. Therefore, patient's been taken her blood pressure medication about every other day and has been remaining stable. Blood pressure readings are between 96-128/74-90. Patient has also had weight fluctuations during the course of this time that is also contributing to the fluctuations in her blood pressure readings.  Review of Systems  Constitutional: Negative.   HENT: Negative.   Respiratory: Negative.   Cardiovascular: Negative.   Musculoskeletal: Negative.   Skin: Negative.   Neurological: Negative.   Psychiatric/Behavioral: Negative.    Past Medical History  Diagnosis Date  . Asthma   . GERD (gastroesophageal reflux disease)   . HTN (hypertension)   . Anxiety   . Dermatitis herpetiformis     History   Social History  . Marital Status: Married    Spouse Name: N/A    Number of Children: N/A  . 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 Smoking status: Never Smoker   . Smokeless tobacco: Never Used   Comment: pt states both parents were smokers  . Alcohol Use: No  . Drug Use: No  . Sexually Active: Not on file   Other Topics Concern  . Not on file   Social History Narrative  . No narrative on file    Past Surgical History  Procedure Date  . Appendectomy   . Cystectomy     Family History  Problem Relation Age of Onset  . Alcohol abuse Father   . Multiple sclerosis Mother   . Lung cancer Mother   . Colon polyps Mother    . Multiple sclerosis Sister   . Colon cancer Neg Hx     Allergies  Allergen Reactions  . Ace Inhibitors     REACTION: cough (lisinopril)  . Penicillins     REACTION: hives  . Prednisone     REACTION: paresthesias  . Sulfonamide Derivatives     REACTION: hives    Current Outpatient Prescriptions on File Prior to Visit  Medication Sig Dispense Refill  . albuterol (PROAIR HFA) 108 (90 BASE) MCG/ACT inhaler Inhale 2 puffs into the lungs every 6 (six) hours as needed.      Marland Kitchen amLODipine (NORVASC) 2.5 MG tablet Take 1 tablet (2.5 mg total) by mouth daily.  90 tablet  3  . aspirin 81 MG tablet Take 81 mg by mouth daily.        . calcium carbonate (OS-CAL) 600 MG TABS Take 600 mg by mouth 2 (two) times daily with a meal.        . doxycycline (VIBRA-TABS) 100 MG tablet Take 1 tablet two times daily after meals for 5 days; avoid sunlight  10 tablet  0  . Fluticasone-Salmeterol (ADVAIR DISKUS) 100-50 MCG/DOSE AEPB Inhale 1 puff into the lungs 2 (two) times daily.  180 each  3  . methylPREDNISolone (MEDROL) 8 MG tablet Take 3 tablets (24mg ) once daily for 3 days, then 2 (16mg ) once daily for  3 days, then 1 (8mg ) once daily for 3 days, then stop.  18 tablet  0  . Multiple Vitamin (MULTIVITAMIN) tablet Take 1 tablet by mouth daily.      Marland Kitchen QVAR 80 MCG/ACT inhaler INHALE 2 PUFFS INTO THE LUNGS TWICE A DAY  3 g  2    BP 90/74  Pulse 95  Temp 97.8 F (36.6 C) (Oral)  Wt 152 lb (68.947 kg)chart    Objective:   Physical Exam  Constitutional: She is oriented to person, place, and time. She appears well-developed and well-nourished.  Neck: Normal range of motion. Neck supple. No thyromegaly present.  Cardiovascular: Normal rate, regular rhythm and normal heart sounds.   Pulmonary/Chest: Effort normal and breath sounds normal.  Musculoskeletal: Normal range of motion.  Neurological: She is alert and oriented to person, place, and time.  Skin: Skin is warm and dry.  Psychiatric: She has a normal  mood and affect.          Assessment & Plan:  Assessment: Hypertension  Plan: Norvasc 2.5 mg every other day. Continue healthy diet and exercise. We'll bring patient back for recheck with Dr. Cato Mulligan in about 3 months and sooner when necessary.

## 2012-05-14 ENCOUNTER — Ambulatory Visit: Payer: 59 | Admitting: Internal Medicine

## 2012-05-18 ENCOUNTER — Ambulatory Visit (INDEPENDENT_AMBULATORY_CARE_PROVIDER_SITE_OTHER): Payer: 59 | Admitting: Internal Medicine

## 2012-05-18 ENCOUNTER — Encounter: Payer: Self-pay | Admitting: Internal Medicine

## 2012-05-18 VITALS — BP 102/76 | HR 76 | Temp 97.7°F | Ht 64.75 in | Wt 156.8 lb

## 2012-05-18 DIAGNOSIS — J45909 Unspecified asthma, uncomplicated: Secondary | ICD-10-CM

## 2012-05-18 DIAGNOSIS — R490 Dysphonia: Secondary | ICD-10-CM

## 2012-05-18 DIAGNOSIS — J4540 Moderate persistent asthma, uncomplicated: Secondary | ICD-10-CM

## 2012-05-18 NOTE — Progress Notes (Signed)
Subjective:    Patient ID: Tracey Boyd, female    DOB: 05/10/1951, 61 y.o.   MRN: [id number]  HPI OV 02/25/2011: followu for asthma evaluation/diagnosis. PFTs 02/25/2011: shows findings c/w moderate-severe obstruction with BD response though TLC and DLCO are slightly diminished. Fev1 is 1.3/55%, 40% BD resppnse. TLC 75%, DLCO 78%. She is now reporting daily nocturnal awakenings. Not using albuterol for rescue. No other symptoms. States that Dr. Cornelius Moras of [facility] told her she needs bubble study  Follow OV 03/18/2011  Pt returns feeling much better with decreased wheezing and dsypnea. DOE is decreased. Feels she can take in deep breath.  Spirometry today is much improved with FEV1 increased from 55% >>82%. No cough or wheezing   Ov 05/17/11: Followup asthma. SAw NP 2 months ago for followup on response to QVAR. Noted to have improved fev1 to normal but today she states it was done while she was using albuterol at will along with QVAR because she did not realize it was rescue medication. Since then not using albuterol. Feels well and greatly improved. No nocturnal awakenings. No cough. No chest tightness. Effort tolerance though improved compared to June is not as good as when she was using albuterol a  Lot.   Social: preparing for her golf charity event Fam hx: no changes Past medical: No changes  Glad you are better  Continue qvar 2 puff twice daily  Use albuterol only as needed  To assess true control, return at your convenience, to our office and have simple spirometry done  Please let me know once that spirometry is done so I can call you back with plan to continue or step up asthma treatment  Have flu shot today  Followup in 4-5 months or sooner iif needed  OV 03/16/2012 Followup moderate-mild persistent asthma.  This is 9 month followup. She was supposed to hv come 5 months ago but missed appt. PAst few days, unwell with viral URI symptoms and increased cough, chest tightness and wheeze and  increased albuterol use and nocturnal awakenings. She insists she is compliant with QVAR  2 puff twice daily. Spirometry - fev1:  1.5L/61%, Ratio 73 while better than baseline is worse than best. OVerall symptom severity is moderate and there no clear cut aggravating or relieving factors. Denies associate chest pain or sputum or edema  Past, Family, Social reviewed: no change since last visit  REC  ou have flare or attack of athma called asthma exacerbation Please take doxycycline 100mg  twice daily after meals x 5 days; avoid sunlight Please take methyl-prednisolone 24mg  once daily x 3 days, then 16mg  once daily x 3 days, then 8mg  once daily x 3 days, and stop   - any side effects please call immediately Please stop QVAR Start advair 100/50 , 1 puff twice daily  - take sample and script REturn in 2 months with spirometry at return If you are not better or getting worse at any point call 911 or our  Number [phone] anytime    OV 05/18/2012  Followup Moderate PErsistent Atshma. Now on advair. Cleda Daub 05/18/2012 - fev1 1.9L/77%, RAtio 74, small airways 55. T his is remarkably better. She feels baseline. Able to row. Reports baseline albuterol use which is improved but still atleast 1 time per day. No nocturnal awakenings.   However, Hoarseness of voice persists. Unrelatd to advair. It is mild and chronic. Quality is of raspy voice. Unchanged over time. Unclear aggravating or relieving factors.    Past, Family, Social  reviewed: odd 1 episode of fever 102 few weeks ago     Review of Systems  Constitutional: Positive for fever. Negative for chills, diaphoresis, activity change, appetite change, fatigue ( two weeks ago) and unexpected weight change.  HENT: Positive for congestion and rhinorrhea. Negative for hearing loss, ear pain, nosebleeds, sore throat, facial swelling, sneezing, mouth sores, trouble swallowing, neck pain, neck stiffness, dental problem, voice change, postnasal drip,  sinus pressure, tinnitus and ear discharge.   Eyes: Negative.  Negative for photophobia, discharge, itching and visual disturbance.  Respiratory: Positive for cough, choking and shortness of breath. Negative for apnea, chest tightness, wheezing and stridor.   Cardiovascular: Negative.  Negative for chest pain, palpitations and leg swelling.  Gastrointestinal: Negative.  Negative for nausea, vomiting, abdominal pain, constipation, blood in stool and abdominal distention.  Genitourinary: Negative.  Negative for dysuria, urgency, frequency, hematuria, flank pain, decreased urine volume and difficulty urinating.  Musculoskeletal: Negative.  Negative for myalgias, back pain, joint swelling, arthralgias and gait problem.  Skin: Negative for color change, pallor and rash.  Neurological: Negative for dizziness, tremors, seizures, syncope, speech difficulty, weakness, light-headedness, numbness and headaches.  Hematological: Negative for adenopathy. Does not bruise/bleed easily.  Psychiatric/Behavioral: Negative for confusion, disturbed wake/sleep cycle and agitation. The patient is not nervous/anxious.        Objective:   Physical Exam   EN: A/Ox3; pleasant , NAD, well nourished   HEENT:  [place]/AT,  EACs-clear, TMs-wnl, NOSE-clear, THROAT-clear, no lesions, no postnasal drip or exudate noted.  VOICE IS RASPY +  NECK:  Supple w/ fair ROM; no JVD; normal carotid impulses w/o bruits; no thyromegaly or nodules palpated; no lymphadenopathy.  RESP  Clear  P & A; w/o, wheezes/ rales/ or rhonchi.no accessory muscle use, no dullness to percussion  CARD:  RRR, no m/r/g  , no peripheral edema, pulses intact, no cyanosis or clubbing.  GI:   Soft & nt; nml bowel sounds; no organomegaly or masses detected.  Musco: Warm bil, no deformities or joint swelling noted.   Neuro: alert, no focal deficits noted.    Skin: Warm, no lesions or rashes        Assessment & Plan:

## 2012-05-18 NOTE — Patient Instructions (Addendum)
Asthma  - glad is improved - continue advair 1 puff twice daily at current dose - have flu shot today 05/18/2012   Hoarseness of voice  - I do not think anymore this is related to active asthma  - I do not think that this is related to inhalers though these are causes for hoarseness - mainly because hoarseness persists despite change in inhalers and you have been on them not too long - Please see ENT specialist at [facility] for the same  Followup  - 6 mnths or sooner if needed

## 2012-05-19 DIAGNOSIS — R490 Dysphonia: Secondary | ICD-10-CM | POA: Insufficient documentation

## 2012-05-19 NOTE — Assessment & Plan Note (Signed)
  Hoarseness of voice  - I do not think anymore this is related to active asthma  - I do not think that this is related to inhalers though these are causes for hoarseness - mainly because hoarseness persists despite change in inhalers and you have been on them not too long - Please see ENT specialist at [facility] for the same

## 2012-05-19 NOTE — Assessment & Plan Note (Signed)
Asthma  - glad is improved - continue advair 1 puff twice daily at current dose - have flu shot today 05/18/2012  Followup  - 6 mnths or sooner if needed

## 2012-08-29 ENCOUNTER — Other Ambulatory Visit: Payer: Self-pay | Admitting: Internal Medicine

## 2012-11-26 ENCOUNTER — Encounter: Payer: Self-pay | Admitting: Family Medicine

## 2012-11-26 ENCOUNTER — Ambulatory Visit (INDEPENDENT_AMBULATORY_CARE_PROVIDER_SITE_OTHER): Payer: 59 | Admitting: Family Medicine

## 2012-11-26 VITALS — BP 118/70 | HR 84 | Temp 97.3°F

## 2012-11-26 DIAGNOSIS — J209 Acute bronchitis, unspecified: Secondary | ICD-10-CM

## 2012-11-26 DIAGNOSIS — H109 Unspecified conjunctivitis: Secondary | ICD-10-CM

## 2012-11-26 MED ORDER — NEOMYCIN-POLYMYXIN-HC OP SUSP: 2.0000 [drp] | OPHTHALMIC | Status: DC

## 2012-11-26 MED ORDER — DOXYCYCLINE HYCLATE 100 MG PO CAPS: 100.0000 mg | ORAL_CAPSULE | Freq: Two times a day (BID) | ORAL | Status: AC

## 2012-11-26 NOTE — Progress Notes (Signed)
  Subjective:    Patient ID: Tracey Boyd, female    DOB: Aug 13, 1951, 63 y.o.   MRN: [id number]  HPI Here for several things. She developed URI symptoms about 2 weeks ago with PND, ST, and a dry cough. This has worsened and now the cough produces yellow sputum. No fever. On her usual inhalers. Also yesterday she developed redness, burning and a crusty DC in the right eye. No eye pain or vision changes.    Review of Systems  Constitutional: Negative.   HENT: Positive for congestion, postnasal drip and sinus pressure. Negative for ear pain.   Eyes: Positive for discharge, redness and itching. Negative for photophobia, pain and visual disturbance.  Respiratory: Positive for cough, chest tightness and wheezing.        Objective:   Physical Exam  Constitutional: She appears well-developed and well-nourished.  HENT:  Right Ear: External ear normal.  Left Ear: External ear normal.  Nose: Nose normal.  Mouth/Throat: Oropharynx is clear and moist.  Eyes: EOM are normal. Pupils are equal, round, and reactive to light.  Right conjunctiva is red with a clear cornea. The left eye is clear   Neck: No thyromegaly present.  Pulmonary/Chest: Effort normal. No respiratory distress. She has no rales.  Scattered rhonchi and wheezes   Lymphadenopathy:    She has no cervical adenopathy.          Assessment & Plan:  Treat the bronchitis with Doxycycline and the conjunctivitis with Cortisporin drops

## 2012-12-07 ENCOUNTER — Encounter: Payer: Self-pay | Admitting: Internal Medicine

## 2012-12-07 ENCOUNTER — Ambulatory Visit (INDEPENDENT_AMBULATORY_CARE_PROVIDER_SITE_OTHER): Payer: 59 | Admitting: Internal Medicine

## 2012-12-07 VITALS — BP 122/78 | HR 66 | Temp 98.0°F | Ht 64.0 in | Wt 156.0 lb

## 2012-12-07 DIAGNOSIS — J4540 Moderate persistent asthma, uncomplicated: Secondary | ICD-10-CM

## 2012-12-07 DIAGNOSIS — J45909 Unspecified asthma, uncomplicated: Secondary | ICD-10-CM

## 2012-12-07 DIAGNOSIS — R490 Dysphonia: Secondary | ICD-10-CM

## 2012-12-07 NOTE — Patient Instructions (Addendum)
#  Asthma - Continue Qvar 2 puffs twice daily - Please have flu shot in September 2014 - We will check spirometry at next visit because her machine is not working today    # Hoarseness of voice - Try low glycemic diet and see if this helps acid reflux - If you continue to have acid reflux and please see a gastroenterologist  #Followup - 6 months, spirometry at followup - Call sooner if there any problems

## 2012-12-07 NOTE — Progress Notes (Signed)
Subjective:    Patient ID: Tracey Boyd, female    DOB: 05/10/1951, 62 y.o.   MRN: [id number]  HPI OV 02/25/2011: followu for asthma evaluation/diagnosis. PFTs 02/25/2011: shows findings c/w moderate-severe obstruction with BD response though TLC and DLCO are slightly diminished. Fev1 is 1.3/55%, 40% BD resppnse. TLC 75%, DLCO 78%. She is now reporting daily nocturnal awakenings. Not using albuterol for rescue. No other symptoms. States that Dr. Cornelius Moras of [facility] told her she needs bubble study  Follow OV 03/18/2011  Pt returns feeling much better with decreased wheezing and dsypnea. DOE is decreased. Feels she can take in deep breath.  Spirometry today is much improved with FEV1 increased from 55% >>82%. No cough or wheezing   Ov 05/17/11: Followup asthma. SAw NP 2 months ago for followup on response to QVAR. Noted to have improved fev1 to normal but today she states it was done while she was using albuterol at will along with QVAR because she did not realize it was rescue medication. Since then not using albuterol. Feels well and greatly improved. No nocturnal awakenings. No cough. No chest tightness. Effort tolerance though improved compared to June is not as good as when she was using albuterol a  Lot.   Social: preparing for her golf charity event Fam hx: no changes Past medical: No changes  Glad you are better  Continue qvar 2 puff twice daily  Use albuterol only as needed  To assess true control, return at your convenience, to our office and have simple spirometry done  Please let me know once that spirometry is done so I can call you back with plan to continue or step up asthma treatment  Have flu shot today  Followup in 4-5 months or sooner iif needed  OV 03/16/2012 Followup moderate-mild persistent asthma.  This is 9 month followup. She was supposed to hv come 5 months ago but missed appt. PAst few days, unwell with viral URI symptoms and increased cough, chest tightness and wheeze and  increased albuterol use and nocturnal awakenings. She insists she is compliant with QVAR  2 puff twice daily. Spirometry - fev1:  1.5L/61%, Ratio 73 while better than baseline is worse than best. OVerall symptom severity is moderate and there no clear cut aggravating or relieving factors. Denies associate chest pain or sputum or edema  Past, Family, Social reviewed: no change since last visit  REC  ou have flare or attack of athma called asthma exacerbation Please take doxycycline 100mg  twice daily after meals x 5 days; avoid sunlight Please take methyl-prednisolone 24mg  once daily x 3 days, then 16mg  once daily x 3 days, then 8mg  once daily x 3 days, and stop   - any side effects please call immediately Please stop QVAR Start advair 100/50 , 1 puff twice daily  - take sample and script REturn in 2 months with spirometry at return If you are not better or getting worse at any point call 911 or our  Number [phone] anytime    OV 05/18/2012  Followup Moderate PErsistent Atshma. Now on advair. Cleda Daub 05/18/2012 - fev1 1.9L/77%, RAtio 74, small airways 55. T his is remarkably better. She feels baseline. Able to row. Reports baseline albuterol use which is improved but still atleast 1 time per day. No nocturnal awakenings.   However, Hoarseness of voice persists. Unrelatd to advair. It is mild and chronic. Quality is of raspy voice. Unchanged over time. Unclear aggravating or relieving factors.    Past, Family, Social  reviewed: odd 1 episode of fever 102 few weeks ago    Asthma  - glad is improved  - continue advair 1 puff twice daily at current dose  - have flu shot today 05/18/2012  Hoarseness of voice  - I do not think anymore this is related to active asthma  - I do not think that this is related to inhalers though these are causes for hoarseness - mainly because hoarseness persists despite change in inhalers and you have been on them not too long  - Please see E[facility] specialist at [facility] for the same  Followup  - 6 mnths or sooner if needed     OV 12/07/2012 Followup moderate persistent asthma. Also with hoarseness of voice not otherwise specified. In terms of asthma she is now taking Qvar because she has a long supply of this. She is using her albuterol every other day which she sees as baseline and not as frequent use. Denies nocturnal awakenings or at rest or exacerbation of admissions or chest tightness or wheezing.  spirometry machine is not working today so I do not have an FEV1 on her  In terms of a hoarseness of voice: Saw Dr Pollyann Kennedy 07/24/2012 for hoarseness - felt to be GERD related, advised to avoid caffeine, etoh and Rx omperazole 2 weeks. She says during this exam he woke up extremely red. 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 She denies cough, ticklish feeling in the throat, gag. She is not interested in a referral to a gastroenterologist at this point   Review of Systems  Constitutional: Negative for fever and unexpected weight change.  HENT: Negative for ear pain, nosebleeds, congestion, sore throat, rhinorrhea, sneezing, trouble swallowing, dental problem, postnasal drip and sinus pressure.   Eyes: Negative for redness and itching.  Respiratory: Negative for cough, chest tightness, shortness of breath and wheezing.   Cardiovascular: Negative for palpitations and leg swelling.  Gastrointestinal: Negative for nausea and vomiting.  Genitourinary: Negative for dysuria.  Musculoskeletal: Negative for joint swelling.  Skin: Negative for rash.  Neurological: Negative for headaches.  Hematological: Does not bruise/bleed easily.  Psychiatric/Behavioral: Negative for dysphoric mood. The patient is not nervous/anxious.        Objective:   Physical Exam EN: A/Ox3; pleasant , NAD, well nourished   HEENT:  [place]/AT,  EACs-clear, TMs-wnl, NOSE-clear, THROAT-clear, no  lesions, no postnasal drip or exudate noted.  VOICE IS RASPY + but seems improved from September 2013  NECK:  Supple w/ fair ROM; no JVD; normal carotid impulses w/o bruits; no thyromegaly or nodules palpated; no lymphadenopathy.  RESP  Clear  P & A; w/o, wheezes/ rales/ or rhonchi.no accessory muscle use, no dullness to percussion  CARD:  RRR, no m/r/g  , no peripheral edema, pulses intact, no cyanosis or clubbing.  GI:   Soft & nt; nml bowel sounds; no organomegaly or masses detected.  Musco: Warm bil, no deformities or joint swelling noted.   Neuro: alert, no focal deficits noted.    Skin: Warm, no lesions or rashes        Assessment & Plan:

## 2012-12-11 NOTE — Assessment & Plan Note (Signed)
#  Asthma - Continue Qvar 2 puffs twice daily - Please have flu shot in September 2014 - We will check spirometry at next visit because her machine is not working today

## 2012-12-11 NOTE — Assessment & Plan Note (Signed)
   #   Hoarseness of voice - Try low glycemic diet and see if this helps acid reflux - If you continue to have acid reflux and please see a gastroenterologist  #Followup - 6 months, spirometry at followup - Call sooner if there any problems

## 2013-04-19 ENCOUNTER — Ambulatory Visit (INDEPENDENT_AMBULATORY_CARE_PROVIDER_SITE_OTHER): Payer: 59 | Admitting: Internal Medicine

## 2013-04-19 ENCOUNTER — Encounter: Payer: Self-pay | Admitting: Internal Medicine

## 2013-04-19 VITALS — BP 122/78 | HR 70 | Ht 64.0 in | Wt 155.8 lb

## 2013-04-19 DIAGNOSIS — J45901 Unspecified asthma with (acute) exacerbation: Secondary | ICD-10-CM

## 2013-04-19 MED ORDER — LEVOFLOXACIN 500 MG PO TABS: 500.0000 mg | ORAL_TABLET | Freq: Every day | ORAL | Status: DC

## 2013-04-19 MED ORDER — METHYLPREDNISOLONE 4 MG PO TABS: ORAL_TABLET | ORAL | Status: DC

## 2013-04-19 MED ORDER — BUDESONIDE-FORMOTEROL FUMARATE 80-4.5 MCG/ACT IN AERO: 2.0000 | INHALATION_SPRAY | Freq: Two times a day (BID) | RESPIRATORY_TRACT | Status: DC

## 2013-04-19 NOTE — Patient Instructions (Addendum)
#  Asthma - you are in exacerbation of asthma - Please take medrol (methylprednisolone) 32mg daily x 2 days, then 16mg daily x 2 days, then 8mg daily x 2 days, then 4mg daily x 2 days and stop - Change  Qvar to symbicort 80/4.5 puff twice daily - Use alb as needed -  take levaquin 500mg once daily  X 5 days - Please have flu shot in September 2014   #Followup - 3 months, spirometry at followup - Call sooner if there any problems 

## 2013-04-19 NOTE — Progress Notes (Signed)
Subjective:    Patient ID: Tracey Boyd, female    DOB: 1951/08/27, 62 y.o.   MRN: [id number]  HPI OV 02/25/2011: followu for asthma evaluation/diagnosis. PFTs 02/25/2011: shows findings c/w moderate-severe obstruction with BD response though TLC and DLCO are slightly diminished. Fev1 is 1.3/55%, 40% BD resppnse. TLC 75%, DLCO 78%. She is now reporting daily nocturnal awakenings. Not using albuterol for rescue. No other symptoms. States that Dr. Cornelius Moras of [facility] told her she needs bubble study  Follow OV 03/18/2011  Pt returns feeling much better with decreased wheezing and dsypnea. DOE is decreased. Feels she can take in deep breath.  Spirometry today is much improved with FEV1 increased from 55% >>82%. No cough or wheezing   Ov 05/17/11: Followup asthma. SAw NP 2 months ago for followup on response to QVAR. Noted to have improved fev1 to normal but today she states it was done while she was using albuterol at will along with QVAR because she did not realize it was rescue medication. Since then not using albuterol. Feels well and greatly improved. No nocturnal awakenings. No cough. No chest tightness. Effort tolerance though improved compared to June is not as good as when she was using albuterol a  Lot.   Social: preparing for her golf charity event Fam hx: no changes Past medical: No changes  Glad you are better  Continue qvar 2 puff twice daily  Use albuterol only as needed  To assess true control, return at your convenience, to our office and have simple spirometry done  Please let me know once that spirometry is done so I can call you back with plan to continue or step up asthma treatment  Have flu shot today  Followup in 4-5 months or sooner iif needed  OV 03/16/2012 Followup moderate-mild persistent asthma.  This is 9 month followup. She was supposed to hv come 5 months ago but missed appt. PAst few days, unwell with viral URI symptoms and increased cough, chest tightness and wheeze and  increased albuterol use and nocturnal awakenings. She insists she is compliant with QVAR  2 puff twice daily. Spirometry - fev1:  1.5L/61%, Ratio 73 while better than baseline is worse than best. OVerall symptom severity is moderate and there no clear cut aggravating or relieving factors. Denies associate chest pain or sputum or edema  Past, Family, Social reviewed: no change since last visit  REC  ou have flare or attack of athma called asthma exacerbation Please take doxycycline 100mg  twice daily after meals x 5 days; avoid sunlight Please take methyl-prednisolone 24mg  once daily x 3 days, then 16mg  once daily x 3 days, then 8mg  once daily x 3 days, and stop   - any side effects please call immediately Please stop QVAR Start advair 100/50 , 1 puff twice daily  - take sample and script REturn in 2 months with spirometry at return If you are not better or getting worse at any point call 911 or our  Number [phone] anytime    OV 05/18/2012  Followup Moderate PErsistent Atshma. Now on advair. Cleda Daub 05/18/2012 - fev1 1.9L/77%, RAtio 74, small airways 55. T his is remarkably better. She feels baseline. Able to row. Reports baseline albuterol use which is improved but still atleast 1 time per day. No nocturnal awakenings.   However, Hoarseness of voice persists. Unrelatd to advair. It is mild and chronic. Quality is of raspy voice. Unchanged over time. Unclear aggravating or relieving factors.    Past, Family, Social  reviewed: odd 1 episode of fever 102 few weeks ago    Asthma  - glad is improved  - continue advair 1 puff twice daily at current dose  - have flu shot today 05/18/2012  Hoarseness of voice  - I do not think anymore this is related to active asthma  - I do not think that this is related to inhalers though these are causes for hoarseness - mainly because hoarseness persists despite change in inhalers and you have been on them not too long  - Please see ENT specialist at [facility]  ENT for the same  Followup  - 6 mnths or sooner if needed     OV 12/07/2012 Followup moderate persistent asthma. Also with hoarseness of voice not otherwise specified. In terms of asthma she is now taking Qvar because she has a long supply of this. She is using her albuterol every other day which she sees as baseline and not as frequent use. Denies nocturnal awakenings or at rest or exacerbation of admissions or chest tightness or wheezing.  spirometry machine is not working today so I do not have an FEV1 on her  In terms of a hoarseness of voice: Saw Dr Pollyann Kennedy 07/24/2012 for hoarseness - felt to be GERD related, advised to avoid caffeine, etoh and Rx omperazole 2 weeks. She says during this exam he woke up extremely red. 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 She denies cough, ticklish feeling in the throat, gag. She is not interested in a referral to a gastroenterologist at this point  REC #Asthma  - Continue Qvar 2 puffs twice daily  - Please have flu shot in September 2014  - We will check spirometry at next visit because her machine is not working today  # Hoarseness of voice  - Try low glycemic diet and see if this helps acid reflux  - If you continue to have acid reflux and please see a gastroenterologist  #Followup  - 6 months, spirometry at followup  - Call sooner if there any problems   OV 04/19/2013 [place] Moderate PErsistent ASthma and hoarseness of voice NOS (presumed GERD, ? Mdi)  This is an acute visit. OVeral well with improved hoarseness but stil present. Past wweek increased cough, chest tighthenss, increased alb rescue use, some nocturnal awakening, increased hoarseness but no sputum or fever. Associatd sensation of headfullness present.  She is due to travel to [place] by road in a few days and wants to make sure everythiung is ok  Spirometry today fev1 1.5L/63%, Ratio 59  (baseline fev1 is around 2L while well RX)  Note: she says prednisone causes her to have arm pain    Past, Family, Social reviewed: due to leave to [place] by road in a few days[place] Noticed low BP at home; stopped norvasc 2.5mg  daily and bp better. With this "fogginess" sensation resolved. 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    Review of Systems  Constitutional: Negative for fever and unexpected weight change.  HENT: Positive for congestion. Negative for ear pain, nosebleeds, sore throat, rhinorrhea, sneezing, trouble swallowing, dental problem, postnasal drip and sinus pressure.   Eyes: Negative for redness and itching.  Respiratory: Positive for cough and shortness of breath. Negative for chest tightness and wheezing.   Cardiovascular: Negative for palpitations and leg swelling.  Gastrointestinal: Negative for nausea and vomiting.  Genitourinary: Negative for dysuria.  Musculoskeletal: Negative for joint swelling.  Skin: Negative for rash.  Neurological: Negative for headaches.  Hematological: Does not bruise/bleed easily.  Psychiatric/Behavioral: Negative for dysphoric mood. The patient is not nervous/anxious.    Current outpatient prescriptions:albuterol (PROAIR HFA) 108 (90 BASE) MCG/ACT inhaler, Inhale 2 puffs into the lungs every 6 (six) hours as needed., Disp: , Rfl: ;  amLODipine (NORVASC) 2.5 MG tablet, , Disp: , Rfl: ;  aspirin 81 MG tablet, Take 81 mg by mouth daily.  , Disp: , Rfl: ;  beclomethasone (QVAR) 80 MCG/ACT inhaler, Inhale 2 puffs into the lungs 2 (two) times daily., Disp: , Rfl:  calcium carbonate (OS-CAL) 600 MG TABS, Take 600 mg by mouth 2 (two) times daily with a meal.  , Disp: , Rfl: ;  Multiple Vitamin (MULTIVITAMIN) tablet, Take 1 tablet by mouth daily., Disp: , Rfl: ;  NEOMYCIN-POLYMYXIN-HC, OPHTH, SUSP, Apply 2 drops to eye every 4 (four) hours., Disp: 7.5 mL, Rfl: 0;   omeprazole (PRILOSEC) 20 MG capsule, Take 1 capsule by mouth daily., Disp: , Rfl:      Objective:   Physical Exam Filed Vitals:   04/19/13 1414  BP: 122/78  Pulse: 70  Height: 5\' 4"  (1.626 m)  Weight: 155 lb 12.8 oz (70.67 kg)  SpO2: 96%  Body mass index is 26.73 kg/(m^2).    EN: A/Ox3; pleasant , NAD, well nourished   HEENT:  Foster/AT,  EACs-clear, TMs-wnl, NOSE-clear, THROAT-clear, no lesions, no postnasal drip or exudate noted.  VOICE IS RASPY + and seems worse than baseline  NECK:  Supple w/ fair ROM; no JVD; normal carotid impulses w/o bruits; no thyromegaly or nodules palpated; no lymphadenopathy.  RESP  Clear  P & A; w/o, wheezes/ rales/ or rhonchi.no accessory muscle use, no dullness to percussion  CARD:  RRR, no m/r/g  , no peripheral edema, pulses intact, no cyanosis or clubbing.  GI:   Soft & nt; nml bowel sounds; no organomegaly or masses detected.  Musco: Warm bil, no deformities or joint swelling noted.   Neuro: alert, no focal deficits noted.    Skin: Warm, no lesions or rashes           Assessment & Plan:

## 2013-04-20 MED ORDER — ALBUTEROL SULFATE HFA 108 (90 BASE) MCG/ACT IN AERS: 2.0000 | INHALATION_SPRAY | Freq: Four times a day (QID) | RESPIRATORY_TRACT | Status: DC | PRN

## 2013-04-25 DIAGNOSIS — J45901 Unspecified asthma with (acute) exacerbation: Secondary | ICD-10-CM | POA: Insufficient documentation

## 2013-04-25 NOTE — Assessment & Plan Note (Signed)
#  Asthma - you are in exacerbation of asthma - Please take medrol (methylprednisolone) 32mg  daily x 2 days, then 16mg  daily x 2 days, then 8mg  daily x 2 days, then 4mg  daily x 2 days and stop - Change  Qvar to symbicort 80/4.5 puff twice daily - Use alb as needed -  take levaquin 500mg  once daily  X 5 days - Please have flu shot in September 2014   #Followup - 3 months, spirometry at followup - Call sooner if there any problems

## 2013-05-25 ENCOUNTER — Telehealth: Payer: Self-pay | Admitting: Internal Medicine

## 2013-05-25 MED ORDER — BUDESONIDE-FORMOTEROL FUMARATE 80-4.5 MCG/ACT IN AERO: 2.0000 | INHALATION_SPRAY | Freq: Two times a day (BID) | RESPIRATORY_TRACT | Status: DC

## 2013-05-25 NOTE — Telephone Encounter (Signed)
Spoke with pt and advised that refills for Symbicort were already sent to [facility] on [address] in 04/2013 and she should still have refills.  Advised that 90 day supply of Symbicort was sent to [facility] per her request.

## 2013-06-17 ENCOUNTER — Other Ambulatory Visit: Payer: Self-pay | Admitting: Obstetrics and Gynecology

## 2013-06-17 DIAGNOSIS — N644 Mastodynia: Secondary | ICD-10-CM

## 2013-06-29 ENCOUNTER — Ambulatory Visit
Admission: RE | Admit: 2013-06-29 | Discharge: 2013-06-29 | Disposition: A | Discharge status: Home / Self Care | Payer: 59 | Source: Ambulatory Visit | Attending: Obstetrics and Gynecology | Admitting: Obstetrics and Gynecology

## 2013-06-29 DIAGNOSIS — N644 Mastodynia: Secondary | ICD-10-CM

## 2013-07-20 ENCOUNTER — Ambulatory Visit: Payer: 59 | Admitting: Family

## 2013-07-22 ENCOUNTER — Other Ambulatory Visit (INDEPENDENT_AMBULATORY_CARE_PROVIDER_SITE_OTHER): Payer: 59

## 2013-07-22 ENCOUNTER — Ambulatory Visit (INDEPENDENT_AMBULATORY_CARE_PROVIDER_SITE_OTHER): Payer: 59 | Admitting: Internal Medicine

## 2013-07-22 ENCOUNTER — Encounter: Payer: Self-pay | Admitting: Internal Medicine

## 2013-07-22 VITALS — BP 108/84 | HR 69 | Temp 97.9°F | Ht 64.0 in | Wt 156.2 lb

## 2013-07-22 DIAGNOSIS — R413 Other amnesia: Secondary | ICD-10-CM

## 2013-07-22 DIAGNOSIS — L659 Nonscarring hair loss, unspecified: Secondary | ICD-10-CM

## 2013-07-22 DIAGNOSIS — I10 Essential (primary) hypertension: Secondary | ICD-10-CM

## 2013-07-22 DIAGNOSIS — Z23 Encounter for immunization: Secondary | ICD-10-CM

## 2013-07-22 DIAGNOSIS — J45909 Unspecified asthma, uncomplicated: Secondary | ICD-10-CM

## 2013-07-22 DIAGNOSIS — R490 Dysphonia: Secondary | ICD-10-CM

## 2013-07-22 DIAGNOSIS — J4540 Moderate persistent asthma, uncomplicated: Secondary | ICD-10-CM

## 2013-07-22 NOTE — Assessment & Plan Note (Signed)
#  Memory issues  = as discussed will hold off neuro evaluation per your wishes - check TSH and Vit D  #General primray care - due to logistics, our [facility] will help having you seen by Dr Yetta Barre or Dr Felicity Coyer downtstairs depending on their department protocol

## 2013-07-22 NOTE — Progress Notes (Signed)
Subjective:    Patient ID: Tracey Boyd, female    DOB: 09/04/1951, 62 y.o.   MRN: [id number]  HPI OV 02/25/2011: followu for asthma evaluation/diagnosis. PFTs 02/25/2011: shows findings c/w moderate-severe obstruction with BD response though TLC and DLCO are slightly diminished. Fev1 is 1.3/55%, 40% BD resppnse. TLC 75%, DLCO 78%. She is now reporting daily nocturnal awakenings. Not using albuterol for rescue. No other symptoms. States that Dr. Cornelius Moras of [facility] told her she needs bubble study  Follow OV 03/18/2011  Pt returns feeling much better with decreased wheezing and dsypnea. DOE is decreased. Feels she can take in deep breath.  Spirometry today is much improved with FEV1 increased from 55% >>82%. No cough or wheezing   Ov 05/17/11: Followup asthma. SAw NP 2 months ago for followup on response to QVAR. Noted to have improved fev1 to normal but today she states it was done while she was using albuterol at will along with QVAR because she did not realize it was rescue medication. Since then not using albuterol. Feels well and greatly improved. No nocturnal awakenings. No cough. No chest tightness. Effort tolerance though improved compared to June is not as good as when she was using albuterol a  Lot.   Social: preparing for her golf charity event Fam hx: no changes Past medical: No changes  Glad you are better  Continue qvar 2 puff twice daily  Use albuterol only as needed  To assess true control, return at your convenience, to our office and have simple spirometry done  Please let me know once that spirometry is done so I can call you back with plan to continue or step up asthma treatment  Have flu shot today  Followup in 4-5 months or sooner iif needed  OV 03/16/2012 Followup moderate-mild persistent asthma.  This is 9 month followup. She was supposed to hv come 5 months ago but missed appt. PAst few days, unwell with viral URI symptoms and increased cough, chest tightness and wheeze and  increased albuterol use and nocturnal awakenings. She insists she is compliant with QVAR  2 puff twice daily. Spirometry - fev1:  1.5L/61%, Ratio 73 while better than baseline is worse than best. OVerall symptom severity is moderate and there no clear cut aggravating or relieving factors. Denies associate chest pain or sputum or edema  Past, Family, Social reviewed: no change since last visit  REC  ou have flare or attack of athma called asthma exacerbation Please take doxycycline 100mg  twice daily after meals x 5 days; avoid sunlight Please take methyl-prednisolone 24mg  once daily x 3 days, then 16mg  once daily x 3 days, then 8mg  once daily x 3 days, and stop   - any side effects please call immediately Please stop QVAR Start advair 100/50 , 1 puff twice daily  - take sample and script REturn in 2 months with spirometry at return If you are not better or getting worse at any point call 911 or our  Number [phone] anytime    OV 05/18/2012  Followup Moderate PErsistent Atshma. Now on advair. Cleda Daub 05/18/2012 - fev1 1.9L/77%, RAtio 74, small airways 55. T his is remarkably better. She feels baseline. Able to row. Reports baseline albuterol use which is improved but still atleast 1 time per day. No nocturnal awakenings.   However, Hoarseness of voice persists. Unrelatd to advair. It is mild and chronic. Quality is of raspy voice. Unchanged over time. Unclear aggravating or relieving factors.    Past, Family, Social  reviewed: odd 1 episode of fever 102 few weeks ago    Asthma  - glad is improved  - continue advair 1 puff twice daily at current dose  - have flu shot today 05/18/2012  Hoarseness of voice  - I do not think anymore this is related to active asthma  - I do not think that this is related to inhalers though these are causes for hoarseness - mainly because hoarseness persists despite change in inhalers and you have been on them not too long  - Please see ENT specialist at [facility]  ENT for the same  Followup  - 6 mnths or sooner if needed     OV 12/07/2012 Followup moderate persistent asthma. Also with hoarseness of voice not otherwise specified. In terms of asthma she is now taking Qvar because she has a long supply of this. She is using her albuterol every other day which she sees as baseline and not as frequent use. Denies nocturnal awakenings or at rest or exacerbation of admissions or chest tightness or wheezing.  spirometry machine is not working today so I do not have an FEV1 on her  In terms of a hoarseness of voice: Saw Dr Pollyann Kennedy 07/24/2012 for hoarseness - felt to be GERD related, advised to avoid caffeine, etoh and Rx omperazole 2 weeks. She says during this exam he woke up extremely red. 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 She denies cough, ticklish feeling in the throat, gag. She is not interested in a referral to a gastroenterologist at this point  REC #Asthma  - Continue Qvar 2 puffs twice daily  - Please have flu shot in September 2014  - We will check spirometry at next visit because her machine is not working today  # Hoarseness of voice  - Try low glycemic diet and see if this helps acid reflux  - If you continue to have acid reflux and please see a gastroenterologist  #Followup  - 6 months, spirometry at followup  - Call sooner if there any problems   OV 04/19/2013 [place] Moderate PErsistent ASthma and hoarseness of voice NOS (presumed GERD, ? Mdi)  This is an acute visit. OVeral well with improved hoarseness but stil present. Past wweek increased cough, chest tighthenss, increased alb rescue use, some nocturnal awakening, increased hoarseness but no sputum or fever. Associatd sensation of headfullness present.  She is due to travel to [place] by road in a few days and wants to make sure everythiung is ok  Spirometry today fev1 1.5L/63%, Ratio 59  (baseline fev1 is around 2L while well RX)  Note: she says prednisone causes her to have arm pain    Past, Family, Social reviewed: due to leave to [place] by road in a few days[place]. Noticed low BP at home; stopped norvasc 2.5mg  daily and bp better. With this "fogginess" sensation resolved. 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   OV 07/22/2013  Followup for moderate persistent asthma and hoarseness of voice not otherwise specified [presumed acid reflux versus inhalers versus speech/singing related]  In terms of asthma: She is feeling well. The Symbicort is working well for her. In fact spirometry today shows FEV11.9 L/83%. FVC 2.7 L/80%. Ratio 74. This is her best ever spirometry and shows excellent control. Albuterol use is only rare. She will have  her influenza shot today  In terms of hoarseness of voice: This is unchanged. She believes it is a little bit worse but to me feels the same. She is not interested in a gastroenterology workup at this time. She still frustrated that she's unable to sing in the church because of this. Have asked her to do research and try to see a voice specialist for the same   New problem: She's having some memory issues. She says that her husband has spotted her repeating things several times within a few minutes of each other. She does not want to see neurology. She wants her thyroid function test rechecked. Last check of TSH was normal in 2011. She also wants a vitamin D level checked. She thinks as these might be etiologies for memory issues. No problem with long-term memory  She wants to mover PMD to [place] at [facility] due to logistics   Review of Systems  Constitutional: Negative for fever and unexpected weight change.  HENT: Negative for congestion, dental problem, ear pain, nosebleeds, postnasal drip, rhinorrhea, sinus pressure, sneezing, sore throat and trouble swallowing.    Eyes: Negative for redness and itching.  Respiratory: Positive for cough and shortness of breath. Negative for chest tightness and wheezing.   Cardiovascular: Negative for palpitations and leg swelling.  Gastrointestinal: Negative for nausea and vomiting.  Genitourinary: Negative for dysuria.  Musculoskeletal: Negative for joint swelling.  Skin: Negative for rash.  Neurological: Negative for headaches.  Hematological: Does not bruise/bleed easily.  Psychiatric/Behavioral: Negative for dysphoric mood. The patient is not nervous/anxious.    Current outpatient prescriptions:albuterol (PROAIR HFA) 108 (90 BASE) MCG/ACT inhaler, Inhale 2 puffs into the lungs every 6 (six) hours as needed., Disp: 1 Inhaler, Rfl: 6;  amLODipine (NORVASC) 2.5 MG tablet, , Disp: , Rfl: ;  aspirin 81 MG tablet, Take 81 mg by mouth daily.  , Disp: , Rfl: ;  budesonide-formoterol (SYMBICORT) 80-4.5 MCG/ACT inhaler, Inhale 2 puffs into the lungs 2 (two) times daily., Disp: 3 Inhaler, Rfl: 3 calcium carbonate (OS-CAL) 600 MG TABS, Take 600 mg by mouth 2 (two) times daily with a meal.  , Disp: , Rfl: ;  Multiple Vitamin (MULTIVITAMIN) tablet, Take 1 tablet by mouth daily., Disp: , Rfl: ;  NEOMYCIN-POLYMYXIN-HC, OPHTH, SUSP, Apply 2 drops to eye every 4 (four) hours., Disp: 7.5 mL, Rfl: 0;  omeprazole (PRILOSEC) 20 MG capsule, Take 1 capsule by mouth daily., Disp: , Rfl:       Objective:   Physical Exam  Vitals reviewed. Constitutional: She is oriented to person, place, and time. She appears well-developed and well-nourished. No distress.  HENT:  Head: Normocephalic and atraumatic.  Right Ear: External ear normal.  Left Ear: External ear normal.  Mouth/Throat: Oropharynx is clear and moist. No oropharyngeal exudate.  Hoarse voice as before; no change  Eyes: Conjunctivae and EOM are normal. Pupils are equal, round, and reactive to light. Right eye exhibits no discharge. Left eye exhibits no discharge. No scleral icterus.   Neck: Normal range of motion. Neck supple. No JVD present. No tracheal deviation present. No thyromegaly present.  Cardiovascular: Normal rate, regular rhythm, normal heart sounds and intact distal pulses.  Exam reveals no gallop and no friction rub.   No murmur heard. Pulmonary/Chest: Effort normal and breath sounds normal. No respiratory distress. She has no wheezes. She has no rales. She exhibits no tenderness.  Abdominal: Soft. Bowel sounds are normal. She exhibits no distension and no mass. There is no tenderness. There  is no rebound and no guarding.  Musculoskeletal: Normal range of motion. She exhibits no edema and no tenderness.  Lymphadenopathy:    She has no cervical adenopathy.  Neurological: She is alert and oriented to person, place, and time. She has normal reflexes. No cranial nerve deficit. She exhibits normal muscle tone. Coordination normal.  Skin: Skin is warm and dry. No rash noted. She is not diaphoretic. No erythema. No pallor.  Psychiatric: She has a normal mood and affect. Her behavior is normal. Judgment and thought content normal.          Assessment & Plan:

## 2013-07-22 NOTE — Assessment & Plan Note (Signed)
#  Asthma - well controlled - continue symbicort 80/4.5, 2 puff twice daily  - CMA will ensure you have had flu shot #Followup - 5 months, spirometry at followup - Call sooner if there any problems

## 2013-07-22 NOTE — Patient Instructions (Signed)
#  Asthma - well controlled - continue symbicort 80/4.5, 2 puff twice daily  - CMA will ensure you have had flu shot  #Hoarse voice  - give 3 days complete voice rest without whispering  - google and look for some voice specialists  #Memory issues  = as discussed will hold off neuro evaluation per your wishes - check TSH and Vit D  #General primray care - due to logistics, our [facility] will help having you seen by Dr Yetta Barre or Dr Felicity Coyer downtstairs depending on their department protocol  #Followup - 5 months, spirometry at followup - Call sooner if there any problems

## 2013-07-22 NOTE — Assessment & Plan Note (Signed)
#  Hoarse voice  - give 3 days complete voice rest without whispering  - google and look for some voice specialists

## 2013-10-14 ENCOUNTER — Telehealth: Payer: Self-pay | Admitting: Internal Medicine

## 2013-10-14 DIAGNOSIS — R490 Dysphonia: Secondary | ICD-10-CM

## 2013-10-14 NOTE — Telephone Encounter (Signed)
Patient emailed. Voice hoarse. She has done research and wants to go to [facility]. Please refer her and once done let her know  Thanks  Dr. Brand Males, M.D., [facility]C.P Pulmonary and Critical Care Medicine Staff Physician [facility] Pager: [phone], If no answer or between  15:00h - 7:00h: call [phone]  10/14/2013 8:46 PM

## 2013-10-20 NOTE — Telephone Encounter (Signed)
Pt would like to know what the status is of getting referred to [facility].  Pt can be reached at [phone].  Satira Anis

## 2013-10-22 NOTE — Telephone Encounter (Addendum)
Order was placed in 10-20-13. I spoke with Suanne Marker because there are not any notes if this was done. According to Epic it looks like it was authorized but no notes about it being scheduled. She has changed order back to pending and will take care of this. Spoke with Suanne Marker again and the pt has an appt on 12-21-13 at 1:20pm with Dr. Addison Bailey  [facility] on home and cell# to advise the [place], [place]

## 2013-10-22 NOTE — Addendum Note (Signed)
Addended by: Lilli Few on: 10/22/2013 05:05 PM   Modules accepted: Orders

## 2013-10-27 NOTE — Telephone Encounter (Signed)
Pt returned call.  Spoke with patient and advised of her pending appt with [facility] on 4.14.15 @ 1320 Pt okay with this appt date/time and denied anything further needed at this time Will sign off

## 2013-12-21 DIAGNOSIS — R490 Dysphonia: Secondary | ICD-10-CM | POA: Insufficient documentation

## 2013-12-21 DIAGNOSIS — J339 Nasal polyp, unspecified: Secondary | ICD-10-CM | POA: Insufficient documentation

## 2013-12-21 DIAGNOSIS — K219 Gastro-esophageal reflux disease without esophagitis: Secondary | ICD-10-CM | POA: Insufficient documentation

## 2013-12-21 DIAGNOSIS — J383 Other diseases of vocal cords: Secondary | ICD-10-CM | POA: Insufficient documentation

## 2014-02-03 ENCOUNTER — Ambulatory Visit: Payer: 59 | Admitting: Internal Medicine

## 2014-04-13 ENCOUNTER — Telehealth: Payer: Self-pay | Admitting: Internal Medicine

## 2014-04-13 MED ORDER — BUDESONIDE-FORMOTEROL FUMARATE 80-4.5 MCG/ACT IN AERO: 2.0000 | INHALATION_SPRAY | Freq: Two times a day (BID) | RESPIRATORY_TRACT | Status: DC

## 2014-04-13 NOTE — Telephone Encounter (Signed)
Per OV 07/2013 w/ MR: #Followup - 5 months, spirometry at followup - Call sooner if there any problems --  Called spoke w/ pt. She reports she has been doing great. appt scheduled to see MR 05/05/14. Refill sent. nothignf urther needed

## 2014-05-03 ENCOUNTER — Telehealth: Payer: Self-pay | Admitting: Internal Medicine

## 2014-05-03 NOTE — Telephone Encounter (Signed)
Let Tracey Boyd [birth year] know that I heard back from Dr Eilleen Kempf downstairs and he is willing to be her PCP with Dr Vanetta Mulders going to administration. She needs to call primary care and formalize the switch  Dr. Brand Males, M.D., [facility].C.P Pulmonary and Critical Care Medicine Staff Physician [place] [facility] Pager: [phone], If no answer or between  15:00h - 7:00h: call [phone]  05/03/2014 9:43 AM

## 2014-05-05 ENCOUNTER — Ambulatory Visit (INDEPENDENT_AMBULATORY_CARE_PROVIDER_SITE_OTHER): Payer: 59 | Admitting: Internal Medicine

## 2014-05-05 ENCOUNTER — Encounter (INDEPENDENT_AMBULATORY_CARE_PROVIDER_SITE_OTHER): Payer: Self-pay

## 2014-05-05 ENCOUNTER — Encounter: Payer: Self-pay | Admitting: Internal Medicine

## 2014-05-05 VITALS — BP 120/80 | HR 89 | Ht 64.75 in

## 2014-05-05 DIAGNOSIS — J45909 Unspecified asthma, uncomplicated: Secondary | ICD-10-CM

## 2014-05-05 DIAGNOSIS — J45901 Unspecified asthma with (acute) exacerbation: Secondary | ICD-10-CM

## 2014-05-05 DIAGNOSIS — J4540 Moderate persistent asthma, uncomplicated: Secondary | ICD-10-CM

## 2014-05-05 NOTE — Progress Notes (Signed)
Subjective:    Patient ID: Tracey Boyd, female    DOB: 11/17/1950, 63 y.o.   MRN: [id number]  HPI OV 02/25/2011: followu for asthma evaluation/diagnosis. PFTs 02/25/2011: shows findings c/w moderate-severe obstruction with BD response though TLC and DLCO are slightly diminished. Fev1 is 1.3/55%, 40% BD resppnse. TLC 75%, DLCO 78%. She is now reporting daily nocturnal awakenings. Not using albuterol for rescue. No other symptoms. States that Dr. Roxy Manns of [place] told her she needs bubble study  Follow OV 03/18/2011  Pt returns feeling much better with decreased wheezing and dsypnea. DOE is decreased. Feels she can take in deep breath.  Spirometry today is much improved with FEV1 increased from 55% >>82%. No cough or wheezing   Ov 05/17/11: Followup asthma. SAw NP 2 months ago for followup on response to QVAR. Noted to have improved fev1 to normal but today she states it was done while she was using albuterol at will along with QVAR because she did not realize it was rescue medication. Since then not using albuterol. Feels well and greatly improved. No nocturnal awakenings. No cough. No chest tightness. Effort tolerance though improved compared to June is not as good as when she was using albuterol a  Lot.   Social: preparing for her golf charity event Fam hx: no changes Past medical: No changes  Glad you are better  Continue qvar 2 puff twice daily  Use albuterol only as needed  To assess true control, return at your convenience, to our office and have simple spirometry done  Please let me know once that spirometry is done so I can call you back with plan to continue or step up asthma treatment  Have flu shot today  Followup in 4-5 months or sooner iif needed  OV 03/16/2012 Followup moderate-mild persistent asthma.  This is 9 month followup. She was supposed to hv come 5 months ago but missed appt. PAst few days, unwell with viral URI symptoms and increased cough, chest tightness and wheeze and  increased albuterol use and nocturnal awakenings. She insists she is compliant with QVAR 51mcg  2 puff twice daily. Spirometry - fev1:  1.5L/61%, Ratio 73 while better than baseline is worse than best. OVerall symptom severity is moderate and there no clear cut aggravating or relieving factors. Denies associate chest pain or sputum or edema  Past, Family, Social reviewed: no change since last visit  REC  ou have flare or attack of athma called asthma exacerbation Please take doxycycline 100mg  twice daily after meals x 5 days; avoid sunlight Please take methyl-prednisolone 24mg  once daily x 3 days, then 16mg  once daily x 3 days, then 8mg  once daily x 3 days, and stop   - any side effects please call immediately Please stop QVAR Start advair 100/50 , 1 puff twice daily  - take sample and script REturn in 2 months with spirometry at return If you are not better or getting worse at any point call 911 or our  Number [phone] anytime    OV 05/18/2012  Followup Moderate PErsistent Atshma. Now on advair. Arlyce Harman 05/18/2012 - fev1 1.9L/77%, RAtio 74, small airways 55. T his is remarkably better. She feels baseline. Able to row. Reports baseline albuterol use which is improved but still atleast 1 time per day. No nocturnal awakenings.   However, Hoarseness of voice persists. Unrelatd to advair. It is mild and chronic. Quality is of raspy voice. Unchanged over time. Unclear aggravating or relieving factors.    Past, Family,  Social reviewed: odd 1 episode of fever 102 few weeks ago    Asthma  - glad is improved  - continue advair 1 puff twice daily at current dose  - have flu shot today 05/18/2012  Hoarseness of voice  - I do not think anymore this is related to active asthma  - I do not think that this is related to inhalers though these are causes for hoarseness - mainly because hoarseness persists despite change in inhalers and you have been on them not too long  - Please see ENT specialist at [facility] for the same  Followup  - 6 mnths or sooner if needed     OV 12/07/2012 Followup moderate persistent asthma. Also with hoarseness of voice not otherwise specified. In terms of asthma she is now taking Qvar because she has a long supply of this. She is using her albuterol every other day which she sees as baseline and not as frequent use. Denies nocturnal awakenings or at rest or exacerbation of admissions or chest tightness or wheezing.  spirometry machine is not working today so I do not have an FEV1 on her  In terms of a hoarseness of voice: Saw Dr Constance Holster 07/24/2012 for hoarseness - felt to be GERD related, advised to avoid caffeine, etoh and Rx omperazole 2 weeks. She says during this exam he woke up extremely red. 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 She denies cough, ticklish feeling in the throat, gag. She is not interested in a referral to a gastroenterologist at this point  [place] #Asthma  - Continue Qvar 2 puffs twice daily  - Please have flu shot in September 2014  - We will check spirometry at next visit because her machine is not working today  # Hoarseness of voice  - Try low glycemic diet and see if this helps acid reflux  - If you continue to have acid reflux and please see a gastroenterologist  #Followup  - 6 months, spirometry at followup  - Call sooner if there any problems   OV 04/19/2013 [place] Moderate PErsistent ASthma and hoarseness of voice NOS (presumed GERD, ? Mdi)  This is an acute visit. OVeral well with improved hoarseness but stil present. Past wweek increased cough, chest tighthenss, increased alb rescue use, some nocturnal awakening, increased hoarseness but no sputum or fever. Associatd sensation of headfullness present.  She is due to travel to [place] by road in a few days and wants to make sure everythiung is ok  Spirometry today fev1 1.5L/63%, Ratio 59  (baseline fev1 is around 2L while well RX)  Note: she says prednisone causes her to have arm pain    Past, Family, Social reviewed: due to leave to [place] by road in a few days[place] Noticed low BP at home; stopped norvasc 2.5mg  daily and bp better. With this "fogginess" sensation resolved. 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   OV 07/22/2013  Followup for moderate persistent asthma and hoarseness of voice not otherwise specified [presumed acid reflux versus inhalers versus speech/singing related]  In terms of asthma: She is feeling well. The Symbicort is working well for her. In fact spirometry today shows FEV11.9 L/83%. FVC 2.7 L/80%. Ratio 74. This is her best ever spirometry and shows excellent control. Albuterol use is only rare. She will  have her influenza shot today  In terms of hoarseness of voice: This is unchanged. She believes it is a little bit worse but to me feels the same. She is not interested in a gastroenterology workup at this time. She still frustrated that she's unable to sing in the church because of this. Have asked her to do research and try to see a voice specialist for the same   New problem: She's having some memory issues. She says that her husband has spotted her repeating things several times within a few minutes of each other. She does not want to see neurology. She wants her thyroid function test rechecked. Last check of TSH was normal in 2011. She also wants a vitamin D level checked. She thinks as these might be etiologies for memory issues. No problem with long-term memory  She wants to mover PMD to [place] at [facility] due to logistics   OV 05/05/2014  Chief Complaint  Patient presents with  . Follow-up    Pt states she is doing well. Pt states she has not needed to use the proventil in 2 years. Pt denies cough, SOB, Cp/tightness.     Followup for moderate persistent asthma  and hoarseness of voice not otherwise specified [presumed acid reflux versus inhalers versus speech/singing related and being followed at [facility]]  In terms of asthma: She is feeling well. The Symbicort is working well for her. In fact spirometry today shows FEV1 1.9 L/79%%. Ratio 79. This is her best ever spirometry and shows excellent control but then she is on short term prednisone for nasal polyps under care of [facility] Dr Joya Gaskins. So could be a prednisone effect as well . Albuterol use is only rare. D/w [employer] Dupulimab study with her. She is interested but does not qualify due to no Ae-asthma prior 12 months. She will have flu shot when available   Past, Family, Social reviewed: no change since last visit. Hoarse voice beter after [facility] visit    Review of Systems  Constitutional: Negative for fever and unexpected weight change.  HENT: Negative for congestion, dental problem, ear pain, nosebleeds, postnasal drip, rhinorrhea, sinus pressure, sneezing, sore throat and trouble swallowing.   Eyes: Negative for redness and itching.  Respiratory: Negative for cough, chest tightness, shortness of breath and wheezing.   Cardiovascular: Negative for palpitations and leg swelling.  Gastrointestinal: Negative for nausea and vomiting.  Genitourinary: Negative for dysuria.  Musculoskeletal: Negative for joint swelling.  Skin: Negative for rash.  Neurological: Negative for headaches.  Hematological: Does not bruise/bleed easily.  Psychiatric/Behavioral: Negative for dysphoric mood. The patient is not nervous/anxious.       Current outpatient prescriptions:amLODipine (NORVASC) 2.5 MG tablet, , Disp: , Rfl: ;  aspirin 81 MG tablet, Take 81 mg by mouth daily.  , Disp: , Rfl: ;  budesonide-formoterol (SYMBICORT) 80-4.5 MCG/ACT inhaler, Inhale 2 puffs into the lungs 2 (two) times daily. Pt states she is using 1 puff BID, Disp: , Rfl: ;  calcium carbonate (OS-CAL) 600 MG TABS, Take 600 mg by mouth daily. ,  Disp: , Rfl:  Cholecalciferol (VITAMIN D3) 2000 UNITS TABS, Take by mouth. Take 1 daily., Disp: , Rfl: ;  fluticasone (FLONASE) 50 MCG/ACT nasal spray, Place into both nostrils as needed., Disp: , Rfl: ;  Multiple Vitamin (MULTIVITAMIN) tablet, Take 1 tablet by mouth daily., Disp: , Rfl: ;  NEOMYCIN-POLYMYXIN-HC, OPHTH, SUSP, Apply 2 drops to eye daily., Disp: , Rfl: ;  omeprazole (PRILOSEC) 20 MG  capsule, Take 1 capsule by mouth daily., Disp: , Rfl:  predniSONE (DELTASONE) 10 MG tablet, Take 1 tablet by mouth. Pt taking pred taper., Disp: , Rfl: ;  albuterol (PROAIR HFA) 108 (90 BASE) MCG/ACT inhaler, Inhale 2 puffs into the lungs every 6 (six) hours as needed., Disp: 1 Inhaler, Rfl: 6  Objective:   Physical Exam  Vitals reviewed. Constitutional: She is oriented to person, place, and time. She appears well-developed and well-nourished. No distress.  HENT:  Head: Normocephalic and atraumatic.  Right Ear: External ear normal.  Left Ear: External ear normal.  Mouth/Throat: Oropharynx is clear and moist. No oropharyngeal exudate.  Eyes: Conjunctivae and EOM are normal. Pupils are equal, round, and reactive to light. Right eye exhibits no discharge. Left eye exhibits no discharge. No scleral icterus.  Neck: Normal range of motion. Neck supple. No JVD present. No tracheal deviation present. No thyromegaly present.  Cardiovascular: Normal rate, regular rhythm, normal heart sounds and intact distal pulses.  Exam reveals no gallop and no friction rub.   No murmur heard. Pulmonary/Chest: Effort normal and breath sounds normal. No respiratory distress. She has no wheezes. She has no rales. She exhibits no tenderness.  Abdominal: Soft. Bowel sounds are normal. She exhibits no distension and no mass. There is no tenderness. There is no rebound and no guarding.  Musculoskeletal: Normal range of motion. She exhibits no edema and no tenderness.  Lymphadenopathy:    She has no cervical adenopathy.    Neurological: She is alert and oriented to person, place, and time. She has normal reflexes. No cranial nerve deficit. She exhibits normal muscle tone. Coordination normal.  Skin: Skin is warm and dry. No rash noted. She is not diaphoretic. No erythema. No pallor.  Psychiatric: She has a normal mood and affect. Her behavior is normal. Judgment and thought content normal.    Filed Vitals:   05/05/14 1019  BP: 120/80  Pulse: 89  Height: 5' 4.75" (1.645 m)  SpO2: 98%         Assessment & Plan:  #Asthma - well controlled - continue symbicort 80/4.5, 2 puff twice daily  - Please ensure you have had flu shot when available in community  #Hoarse voice  -per [facility]   #Followup - 6 months, spirometry at followup - Call sooner if there any problems

## 2014-05-05 NOTE — Patient Instructions (Addendum)
#  Asthma - well controlled - continue symbicort 80/4.5, 2 puff twice daily  - Please ensure you have had flu shot when available in community  #Hoarse voice  -per [facility]   #Followup - 6 months, spirometry at followup - Call sooner if there any problems

## 2014-05-09 DIAGNOSIS — J329 Chronic sinusitis, unspecified: Secondary | ICD-10-CM | POA: Insufficient documentation

## 2014-05-09 NOTE — Telephone Encounter (Signed)
Called and spoke to pt. Informed pt of recs per MR. Pt verbalized understanding and denied any further questions or concerns at this time.  

## 2014-08-17 ENCOUNTER — Ambulatory Visit: Payer: 59 | Admitting: Internal Medicine

## 2014-08-25 ENCOUNTER — Ambulatory Visit (INDEPENDENT_AMBULATORY_CARE_PROVIDER_SITE_OTHER): Payer: No Typology Code available for payment source | Admitting: Internal Medicine

## 2014-08-25 ENCOUNTER — Encounter: Payer: Self-pay | Admitting: Internal Medicine

## 2014-08-25 ENCOUNTER — Other Ambulatory Visit (INDEPENDENT_AMBULATORY_CARE_PROVIDER_SITE_OTHER): Payer: No Typology Code available for payment source

## 2014-08-25 VITALS — BP 124/82 | HR 63 | Temp 97.5°F | Resp 16 | Ht 64.75 in | Wt 160.0 lb

## 2014-08-25 DIAGNOSIS — I10 Essential (primary) hypertension: Secondary | ICD-10-CM

## 2014-08-25 DIAGNOSIS — E785 Hyperlipidemia, unspecified: Secondary | ICD-10-CM | POA: Insufficient documentation

## 2014-08-25 DIAGNOSIS — K219 Gastro-esophageal reflux disease without esophagitis: Secondary | ICD-10-CM

## 2014-08-25 DIAGNOSIS — Z23 Encounter for immunization: Secondary | ICD-10-CM

## 2014-08-25 LAB — COMPREHENSIVE METABOLIC PANEL
ALT: 21 U/L (ref 0–35)
AST: 26 U/L (ref 0–37)
Albumin: 4.6 g/dL (ref 3.5–5.2)
Alkaline Phosphatase: 62 U/L (ref 39–117)
BILIRUBIN TOTAL: 0.7 mg/dL (ref 0.2–1.2)
BUN: 21 mg/dL (ref 6–23)
CO2: 25 mEq/L (ref 19–32)
Calcium: 9.9 mg/dL (ref 8.4–10.5)
Chloride: 106 mEq/L (ref 96–112)
Creatinine, Ser: 0.9 mg/dL (ref 0.4–1.2)
GFR: 66.26 mL/min (ref >60.00)
GLUCOSE: 93 mg/dL (ref 70–99)
Potassium: 3.6 mEq/L (ref 3.5–5.1)
Sodium: 141 mEq/L (ref 135–145)
TOTAL PROTEIN: 8 g/dL (ref 6.0–8.3)

## 2014-08-25 LAB — LIPID PANEL
CHOLESTEROL: 204 mg/dL — AB (ref 0–200)
HDL: 52.4 mg/dL (ref >39.00)
LDL Cholesterol: 137 mg/dL — ABNORMAL HIGH (ref 0–99)
NonHDL: 151.6
Total CHOL/HDL Ratio: 4
Triglycerides: 73 mg/dL (ref 0.0–149.0)
VLDL: 14.6 mg/dL (ref 0.0–40.0)

## 2014-08-25 LAB — CBC WITH DIFFERENTIAL/PLATELET
BASOS PCT: 0.6 % (ref 0.0–3.0)
Basophils Absolute: 0 10*3/uL (ref 0.0–0.1)
Eosinophils Absolute: 0.5 10*3/uL (ref 0.0–0.7)
Eosinophils Relative: 6.2 % — ABNORMAL HIGH (ref 0.0–5.0)
HEMATOCRIT: 42.2 % (ref 36.0–46.0)
Hemoglobin: 14.1 g/dL (ref 12.0–15.0)
Lymphocytes Relative: 31.4 % (ref 12.0–46.0)
Lymphs Abs: 2.4 10*3/uL (ref 0.7–4.0)
MCHC: 33.5 g/dL (ref 30.0–36.0)
MCV: 89.2 fl (ref 78.0–100.0)
MONOS PCT: 8.1 % (ref 3.0–12.0)
Monocytes Absolute: 0.6 10*3/uL (ref 0.1–1.0)
Neutro Abs: 4 10*3/uL (ref 1.4–7.7)
Neutrophils Relative %: 53.7 % (ref 43.0–77.0)
PLATELETS: 273 10*3/uL (ref 150.0–400.0)
RBC: 4.73 Mil/uL (ref 3.87–5.11)
RDW: 13.5 % (ref 11.5–15.5)
WBC: 7.5 10*3/uL (ref 4.0–10.5)

## 2014-08-25 LAB — TSH: TSH: 2.86 u[IU]/mL (ref 0.35–4.50)

## 2014-08-25 MED ORDER — PNEUMOCOCCAL VAC POLYVALENT 25 MCG/0.5ML IJ INJ: 0.5000 mL | INJECTION | INTRAMUSCULAR | Status: AC

## 2014-08-25 NOTE — Patient Instructions (Signed)

## 2014-08-25 NOTE — Progress Notes (Signed)
   Subjective:    Patient ID: Tracey Boyd, female    DOB: 09-Jun-1951, 63 y.o.   MRN: [id number]  Hypertension This is a chronic problem. The current episode started more than 1 year ago. The problem has been gradually improving since onset. The problem is controlled. Pertinent negatives include no anxiety, blurred vision, chest pain, headaches, malaise/fatigue, neck pain, orthopnea, palpitations, peripheral edema, PND, shortness of breath or sweats. There are no associated agents to hypertension. Past treatments include calcium channel blockers. The current treatment provides significant improvement. There are no compliance problems.       Review of Systems  Constitutional: Negative.  Negative for fever, chills, malaise/fatigue, diaphoresis, appetite change and fatigue.  HENT: Negative.   Eyes: Negative.  Negative for blurred vision.  Respiratory: Negative.  Negative for cough, choking, chest tightness, shortness of breath and stridor.   Cardiovascular: Negative.  Negative for chest pain, palpitations, orthopnea, leg swelling and PND.  Gastrointestinal: Negative.  Negative for nausea, vomiting, abdominal pain, diarrhea, constipation and blood in stool.  Endocrine: Negative.   Genitourinary: Negative.   Musculoskeletal: Negative.  Negative for myalgias, back pain, joint swelling, arthralgias and neck pain.  Skin: Negative.  Negative for rash.  Allergic/Immunologic: Negative.   Neurological: Negative.  Negative for headaches.  Hematological: Negative.  Negative for adenopathy. Does not bruise/bleed easily.  Psychiatric/Behavioral: Negative.        Objective:   Physical Exam  Constitutional: She is oriented to person, place, and time. She appears well-developed and well-nourished. No distress.  HENT:  Head: Normocephalic and atraumatic.  Mouth/Throat: Oropharynx is clear and moist. No oropharyngeal exudate.  Eyes: Conjunctivae are normal. Right eye exhibits no discharge. Left eye  exhibits no discharge. No scleral icterus.  Neck: Normal range of motion. Neck supple. No JVD present. No tracheal deviation present. No thyromegaly present.  Cardiovascular: Normal rate, regular rhythm, normal heart sounds and intact distal pulses.  Exam reveals no gallop and no friction rub.   No murmur heard. Pulmonary/Chest: Effort normal and breath sounds normal. No stridor. No respiratory distress. She has no wheezes. She has no rales. She exhibits no tenderness.  Abdominal: Soft. Bowel sounds are normal. She exhibits no distension and no mass. There is no tenderness. There is no rebound and no guarding.  Musculoskeletal: Normal range of motion. She exhibits no edema or tenderness.  Lymphadenopathy:    She has no cervical adenopathy.  Neurological: She is oriented to person, place, and time.  Skin: Skin is warm and dry. No rash noted. She is not diaphoretic. No erythema. No pallor.  Psychiatric: She has a normal mood and affect. Her behavior is normal. Judgment and thought content normal.  Vitals reviewed.    Lab Results  Component Value Date   WBC 4.6 08/06/2010   HGB 13.0 08/06/2010   HCT 37.9 08/06/2010   PLT 235.0 08/06/2010   GLUCOSE 96 08/06/2010   CHOL 182 08/06/2010   TRIG 62.0 08/06/2010   HDL 49.40 08/06/2010   LDLCALC 120* 08/06/2010   ALT 17 08/06/2010   AST 22 08/06/2010   NA 143 08/06/2010   K 4.0 08/06/2010   CL 108 08/06/2010   CREATININE 0.8 08/06/2010   BUN 22 08/06/2010   CO2 29 08/06/2010   TSH 2.91 07/22/2013       Assessment & Plan:

## 2014-08-29 NOTE — Assessment & Plan Note (Addendum)
Her LDL is very slightly elevated above the goal of 130 She does not want to take a statin She will work on her lifestyle modifications Will recheck her FLP in one year

## 2014-08-29 NOTE — Assessment & Plan Note (Signed)
She has been off of the CCB for several weeks and her BP is well controlled Will check her labs today to look for end organ damage and secondary causes of HTN She will cont her lifestyle modifications

## 2014-09-27 ENCOUNTER — Telehealth: Payer: Self-pay | Admitting: Internal Medicine

## 2014-09-27 MED ORDER — OMEPRAZOLE 20 MG PO CPDR: 20.0000 mg | DELAYED_RELEASE_CAPSULE | Freq: Every day | ORAL | Status: DC

## 2014-09-27 NOTE — Telephone Encounter (Signed)
Done

## 2014-09-27 NOTE — Telephone Encounter (Signed)
Pt needs a refill of Omeprazole caps 40 mg aka Prilosec for her GERD. [facility] on [address].

## 2014-10-04 ENCOUNTER — Telehealth: Payer: Self-pay | Admitting: Internal Medicine

## 2014-10-04 DIAGNOSIS — K2100 Gastro-esophageal reflux disease with esophagitis, without bleeding: Secondary | ICD-10-CM

## 2014-10-04 MED ORDER — OMEPRAZOLE 40 MG PO CPDR: 40.0000 mg | DELAYED_RELEASE_CAPSULE | Freq: Every day | ORAL | Status: DC

## 2014-10-04 NOTE — Telephone Encounter (Signed)
done

## 2014-10-04 NOTE — Telephone Encounter (Signed)
Patient said the [facility] prescription she picked up was 52ml but she said she needs 21ml.  Can this be corrected.

## 2015-01-17 ENCOUNTER — Telehealth: Payer: Self-pay | Admitting: Internal Medicine

## 2015-01-17 MED ORDER — ALBUTEROL SULFATE HFA 108 (90 BASE) MCG/ACT IN AERS: 2.0000 | INHALATION_SPRAY | Freq: Four times a day (QID) | RESPIRATORY_TRACT | Status: DC | PRN

## 2015-01-17 MED ORDER — METHYLPREDNISOLONE 4 MG PO TBPK: ORAL_TABLET | ORAL | Status: DC

## 2015-01-17 NOTE — Telephone Encounter (Signed)
Per SN: continue Symbicort and Proair HFA.  May also have medrol dosepak.  Thanks.  Called spoke with patient who reported that she does not have a rescue inhaler (has not needed it since 2014) and would like rx for this.  Will send under MR.  Also advised on the medrol dosepak as recommended by SN.  Pt voiced her understanding.  Rx sent to verified pharmacy, nothing further needed.

## 2015-01-17 NOTE — Telephone Encounter (Signed)
Patient is flying to [facility], her sister is dying from Cancer, she said she doesn't have time to come in for a visit, she is keeping her granddaughter and trying to pack for her trip, she has a "croupy" cough.  Chest tightness.  SOB.  Patient would like something called into pharmacy.  Tracey Boyd

## 2015-01-17 NOTE — Telephone Encounter (Signed)
Message printed and placed on  SN cart for review  

## 2015-01-19 ENCOUNTER — Telehealth: Payer: Self-pay | Admitting: Internal Medicine

## 2015-01-19 MED ORDER — AZITHROMYCIN 250 MG PO TABS: 250.0000 mg | ORAL_TABLET | ORAL | Status: DC

## 2015-01-19 NOTE — Telephone Encounter (Signed)
Pt returning call.Tracey Boyd ° °

## 2015-01-19 NOTE — Telephone Encounter (Signed)
Pt aware that in MR absence and urgency of her message, Dr Annamaria Boots (DOD) answered this message.  Per CY: rec's ZPAK and visit with UC/ED if symptoms increase while in [place]. Called into [employer]. Nothing further needed.

## 2015-01-19 NOTE — Telephone Encounter (Signed)
Pt returned call [phone]

## 2015-01-19 NOTE — Telephone Encounter (Signed)
Pt states that she was at the pharmacy when we tried to call her back -- I apologized for the delay as I did not have a good contact number to reach her back.  Pt currently taking Pred daily - 4mg  Medrol dosepak Pt reports starting to cough up yellow mucus this AM Using all maintenance inhalers as directed.  Pt is flying out to [place] to visit a sick friend - only has 3 weeks to live so she is wanting to leave ASAP. Plane leaves at 2pm Needs something called in ASAP.  Allergies  Allergen Reactions  . Ace Inhibitors     REACTION: cough (lisinopril)  . Penicillins     REACTION: hives  . Prednisone     REACTION: paresthesias  . Sulfonamide Derivatives     REACTION: hives   Please advise Dr Annamaria Boots. Thanks.    Medication List       This list is accurate as of: 01/19/15 10:14 AM.  Always use your most recent med list.               albuterol 108 (90 BASE) MCG/ACT inhaler  Commonly known as:  PROAIR HFA  Inhale 2 puffs into the lungs every 6 (six) hours as needed.     aspirin 81 MG tablet  Take 81 mg by mouth daily.     budesonide-formoterol 80-4.5 MCG/ACT inhaler  Commonly known as:  SYMBICORT  Inhale 2 puffs into the lungs 2 (two) times daily. Pt states she is using 1 puff BID     calcium carbonate 600 MG Tabs tablet  Commonly known as:  OS-CAL  Take 600 mg by mouth daily.     fluticasone 50 MCG/ACT nasal spray  Commonly known as:  FLONASE  Place into both nostrils as needed.     methylPREDNISolone 4 MG Tbpk tablet  Commonly known as:  MEDROL DOSEPAK  follow package directions     multivitamin tablet  Take 1 tablet by mouth daily.     NEOMYCIN-POLYMYXIN-HC OP  Apply to eye. 2 drops to eye daily     omeprazole 40 MG capsule  Commonly known as:  PRILOSEC  Take 1 capsule (40 mg total) by mouth daily.     Vitamin D3 2000 UNITS Tabs  Take by mouth. Take 1 daily.

## 2015-01-19 NOTE — Telephone Encounter (Signed)
LMTCB x 1 

## 2015-01-23 ENCOUNTER — Telehealth: Payer: Self-pay | Admitting: Internal Medicine

## 2015-01-23 ENCOUNTER — Encounter (INDEPENDENT_AMBULATORY_CARE_PROVIDER_SITE_OTHER): Payer: Self-pay

## 2015-01-23 ENCOUNTER — Encounter: Payer: Self-pay | Admitting: Internal Medicine

## 2015-01-23 ENCOUNTER — Ambulatory Visit (INDEPENDENT_AMBULATORY_CARE_PROVIDER_SITE_OTHER): Payer: PRIVATE HEALTH INSURANCE | Admitting: Internal Medicine

## 2015-01-23 VITALS — BP 104/80 | HR 88 | Ht 64.0 in | Wt 156.2 lb

## 2015-01-23 DIAGNOSIS — R05 Cough: Secondary | ICD-10-CM | POA: Diagnosis not present

## 2015-01-23 DIAGNOSIS — R058 Other specified cough: Secondary | ICD-10-CM

## 2015-01-23 MED ORDER — FLUTTER DEVI: Status: DC

## 2015-01-23 MED ORDER — TRAMADOL HCL 50 MG PO TABS: ORAL_TABLET | ORAL | Status: DC

## 2015-01-23 MED ORDER — METHYLPREDNISOLONE 4 MG PO TBPK: ORAL_TABLET | ORAL | Status: DC

## 2015-01-23 NOTE — Progress Notes (Signed)
Subjective:    Patient ID: Tracey Boyd, female    DOB: Jan 21, 1951,     MRN: [id number]   63 yowm never smoker with onset of ? EIA in early 20's but never on maint rx until qvar / symbicort started around 2012 by Tracey Boyd    HPI OV 02/25/2011: followu for asthma evaluation/diagnosis. PFTs 02/25/2011: shows findings c/w moderate-severe obstruction with BD response though TLC and DLCO are slightly diminished. Fev1 is 1.3/55%, 40% BD response. TLC 75%, DLCO 78%. She is now reporting daily nocturnal awakenings. Not using albuterol for rescue. No other symptoms. States that Dr. Roxy Boyd of [place] told her she needs bubble study  Follow OV 03/18/2011  Pt returns feeling much better with decreased wheezing and dsypnea. DOE is decreased. Feels she can take in deep breath.  Spirometry today is much improved with FEV1 increased from 55% >>82%. No cough or wheezing   Ov 05/17/11: Followup asthma. SAw NP 2 months ago for followup on response to QVAR. Noted to have improved fev1 to normal but today she states it was done while she was using albuterol at will along with QVAR because she did not realize it was rescue medication. Since then not using albuterol. Feels well and greatly improved. No nocturnal awakenings. No cough. No chest tightness. Effort tolerance though improved compared to June is not as good as when she was using albuterol a  Lot.   Social: preparing for her golf charity event Fam hx: no changes Past medical: No changes  Glad you are better  Continue qvar 2 puff twice daily  Use albuterol only as needed  To assess true control, return at your convenience, to our office and have simple spirometry done  Please let me know once that spirometry is done so I can call you back with plan to continue or step up asthma treatment  Have flu shot today  Followup in 4-5 months or sooner iif needed  OV 03/16/2012 Followup moderate-mild persistent asthma.  This is 9 month followup. She was supposed to  hv come 5 months ago but missed appt. PAst few days, unwell with viral URI symptoms and increased cough, chest tightness and wheeze and increased albuterol use and nocturnal awakenings. She insists she is compliant with QVAR 28mcg  2 puff twice daily. Spirometry - fev1:  1.5L/61%, Ratio 73 while better than baseline is worse than best. OVerall symptom severity is moderate and there no clear cut aggravating or relieving factors. Denies associate chest pain or sputum or edema  Past, Family, Social reviewed: no change since last visit  REC  ou have flare or attack of athma called asthma exacerbation Please take doxycycline 100mg  twice daily after meals x 5 days; avoid sunlight Please take methyl-prednisolone 24mg  once daily x 3 days, then 16mg  once daily x 3 days, then 8mg  once daily x 3 days, and stop   - any side effects please call immediately Please stop QVAR Start advair 100/50 , 1 puff twice daily  - take sample and script REturn in 2 months with spirometry at return If you are not better or getting worse at any point call 911 or our  Number [phone] anytime    OV 05/18/2012  Followup Moderate PErsistent Atshma. Now on advair. Arlyce Harman 05/18/2012 - fev1 1.9L/77%, RAtio 74, small airways 55. T his is remarkably better. She feels baseline. Able to row. Reports baseline albuterol use which is improved but still atleast 1 time per day. No nocturnal awakenings.   However,  Hoarseness of voice persists. Unrelatd to advair. It is mild and chronic. Quality is of raspy voice. Unchanged over time. Unclear aggravating or relieving factors.    Past, Family, Social reviewed: odd 1 episode of fever 102 few weeks ago    Asthma  - glad is improved  - continue advair 1 puff twice daily at current dose  - have flu shot today 05/18/2012  Hoarseness of voice  - I do not think anymore this is related to active asthma  - I do not think that this is related to inhalers though these are causes for hoarseness -  mainly because hoarseness persists despite change in inhalers and you have been on them not too long  - Please see ENT specialist at [facility] for the same  Followup  - 6 mnths or sooner if needed     OV 12/07/2012 Followup moderate persistent asthma. Also with hoarseness of voice not otherwise specified. In terms of asthma she is now taking Qvar because she has a long supply of this. She is using her albuterol every other day which she sees as baseline and not as frequent use. Denies nocturnal awakenings or at rest or exacerbation of admissions or chest tightness or wheezing.  spirometry machine is not working today so I do not have an FEV1 on her  In terms of a hoarseness of voice: Saw Dr Constance Holster 07/24/2012 for hoarseness - felt to be GERD related, advised to avoid caffeine, etoh and Rx omperazole 2 weeks. She says during this exam he woke up extremely red. 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 She denies cough, ticklish feeling in the throat, gag. She is not interested in a referral to a gastroenterologist at this point  [place] #Asthma  - Continue Qvar 2 puffs twice daily  - Please have flu shot in September 2014  - We will check spirometry at next visit because her machine is not working today  # Hoarseness of voice  - Try low glycemic diet and see if this helps acid reflux  - If you continue to have acid reflux and please see a gastroenterologist  #Followup  - 6 months, spirometry at followup  - Call sooner if there any problems   OV 04/19/2013 [place] Moderate PErsistent ASthma and hoarseness of voice NOS (presumed GERD, ? Mdi)  This is an acute visit. OVeral well with improved hoarseness but stil present. Past wweek increased cough, chest tighthenss, increased alb rescue use, some nocturnal awakening, increased hoarseness but no sputum or fever. Associatd sensation of headfullness present.   She is due to travel to [place] by road in a few days and wants to make sure everythiung is ok  Spirometry today fev1 1.5L/63%, Ratio 59 (baseline fev1 is around 2L while well RX)  Note: she says prednisone causes her to have arm pain    Past, Family, Social reviewed: due to leave to [place] by road in a few days[place] Noticed low BP at home; stopped norvasc 2.5mg  daily and bp better. With this "fogginess" sensation resolved. 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   OV 07/22/2013  Followup for moderate persistent asthma and hoarseness of voice not otherwise specified [presumed acid reflux versus inhalers versus speech/singing related]  In terms of asthma: She is feeling well. The Symbicort is working well for  her. In fact spirometry today shows FEV11.9 L/83%. FVC 2.7 L/80%. Ratio 74. This is her best ever spirometry and shows excellent control. Albuterol use is only rare. She will have her influenza shot today  In terms of hoarseness of voice: This is unchanged. She believes it is a little bit worse but to me feels the same. She is not interested in a gastroenterology workup at this time. She still frustrated that she's unable to sing in the church because of this. Have asked her to do research and try to see a voice specialist for the same   New problem: She's having some memory issues. She says that her husband has spotted her repeating things several times within a few minutes of each other. She does not want to see neurology. She wants her thyroid function test rechecked. Last check of TSH was normal in 2011. She also wants a vitamin D level checked. She thinks as these might be etiologies for memory issues. No problem with long-term memory  She wants to mover PMD to [place] [facility] due to logistics   OV 05/05/2014  Chief Complaint  Patient presents with  . Follow-up    Pt states she is doing well. Pt  states she has not needed to use the proventil in 2 years. Pt denies cough, SOB, Cp/tightness.   rec #Asthma - well controlled - continue symbicort 80/4.5, 2 puff twice daily  - Please ensure you have had flu shot when available in community  #Hoarse voice  -per [facility]   #Followup - 6 months, spirometry at followup - Call sooner if there any problems   01/23/2015 acute  ov/Dorise Gangi re: cough on symbicort 80 one twice daily maint/ did not have saba Chief Complaint  Patient presents with  . Acute Visit    Pt c/o prod cough with yellow sputum x 01/19/15. Has taken zithromax and this did not help.   acute onset  Around 01/17/15 and progressively downhill since with severe cough / gagging but no vomiting  Zpak/ medrol completed prilosec one hour before bfast as maint s overt hb on this  Not really sob unless coughing  No obvious day to day or daytime variabilty or assoc  cp or chest tightness, subjective wheeze overt sinus or hb symptoms. No unusual exp hx or h/o childhood pna/ asthma or knowledge of premature birth.  Sleeping ok without nocturnal  or early am exacerbation  of respiratory  c/o's or need for noct saba. Also denies any obvious fluctuation of symptoms with weather or environmental changes or other aggravating or alleviating factors except as outlined above   Current Medications, Allergies, Complete Past Medical History, Past Surgical History, Family History, and Social History were reviewed in [employer] record.  ROS  The following are not active complaints unless bolded sore throat, dysphagia, dental problems, itching, sneezing,  nasal congestion or excess/ purulent secretions, ear ache,   fever, chills, sweats, unintended wt loss, pleuritic or exertional cp, hemoptysis,  orthopnea pnd or leg swelling, presyncope, palpitations, heartburn, abdominal pain, anorexia, nausea, vomiting, diarrhea  or change in bowel or urinary habits, change in stools or  urine, dysuria,hematuria,  rash, arthralgias, visual complaints, headache, numbness weakness or ataxia or problems with walking or coordination,  change in mood/affect or memory.          Objective:   Physical Exam     amb wf quite anxious, rapid speech/ nad x for harsh barking quality upper airway cough  Wt  Readings from Last 3 Encounters:  01/23/15 156 lb 3.2 oz (70.852 kg)  08/25/14 160 lb (72.576 kg)  07/22/13 156 lb 3.2 oz (70.852 kg)    Vital signs reviewed   HEENT: nl dentition, turbinates, and orophanx. Nl external ear canals without cough reflex   NECK :  without JVD/Nodes/TM/ nl carotid upstrokes bilaterally   LUNGS: no acc muscle use, mostly pseudowheezing    CV:  RRR  no s3 or murmur or increase in P2, no edema   ABD:  soft and nontender with nl excursion in the supine position. No bruits or organomegaly, bowel sounds nl  MS:  warm without deformities, calf tenderness, cyanosis or clubbing  SKIN: warm and dry without lesions    NEURO:  alert, approp, no deficits              Assessment & Plan:

## 2015-01-23 NOTE — Patient Instructions (Addendum)
Whenever cough for whatever reason :  prilosec 40 mg Take 30-60 min    before your first and last meals of the day and Pepcid 20 mg one at bedtime   GERD (REFLUX)  is an extremely common cause of respiratory symptoms just like yours , many times with no obvious heartburn at all.    It can be treated with medication, but also with lifestyle changes including elevation of head of bed x 6 in blocks,  avoidance of late meals, excessive alcohol, smoking cessation, and avoid fatty foods, chocolate, peppermint, colas, red wine, and acidic juices such as orange juice.  NO MINT OR MENTHOL PRODUCTS SO NO COUGH DROPS  USE SUGARLESS CANDY INSTEAD (Jolley ranchers or Stover's or Life Savers) or even ice chips will also do - the key is to swallow to prevent all throat clearing. NO OIL BASED VITAMINS - use powdered substitutes.  mucinex dm 1200  Mg every 12 hours and add tramadol if can't stop coughing and use the flutter valve   Repeat medrol dose pack   Call by 01/28/15 if not improving  Late add declined cxr agreed to return for one if not improving and prilosec should be Take 30- 60 min before your first and last meals of the day

## 2015-01-23 NOTE — Telephone Encounter (Signed)
Called and spoke to pt. Pt c/o persistent cough despite zpak. Appt made with MW today (5/16) at 4:15. Pt verbalized understanding and denied any further questions or concerns at this time.

## 2015-01-24 ENCOUNTER — Encounter: Payer: Self-pay | Admitting: Internal Medicine

## 2015-01-24 ENCOUNTER — Telehealth: Payer: Self-pay | Admitting: *Deleted

## 2015-01-24 DIAGNOSIS — R058 Other specified cough: Secondary | ICD-10-CM | POA: Insufficient documentation

## 2015-01-24 DIAGNOSIS — R05 Cough: Secondary | ICD-10-CM | POA: Insufficient documentation

## 2015-01-24 NOTE — Telephone Encounter (Signed)
-----   Message from Tanda Rockers, MD sent at 01/24/2015  6:03 AM EDT ----- Intended to do cxr :  return for one if not improving and prilosec should be Take 30- 60 min before your first and last meals of the day

## 2015-01-24 NOTE — Telephone Encounter (Signed)
Spoke with the pt and notified of recs per MW  She verbalized understanding  She is feeling some better  Will call by 5/20 for cxr of not satisfied

## 2015-01-24 NOTE — Assessment & Plan Note (Signed)
Acute severe flair while maint on symbicort 80 2bid and ppi qd  I had an extended discussion with the patient reviewing all relevant studies completed to date and  lasting 15 to 20 minutes of a 25 minute visit on the following ongoing concerns:   Explained the natural history of uri and why it's necessary in patients at risk to treat GERD aggressively - at least  short term -   to reduce risk of evolving cyclical cough initially  triggered by epithelial injury and a heightened sensitivty to the effects of any upper airway irritants,  most importantly acid - related - then perpetuated by epithelial injury related to the cough itself as the upper airway collapses on itself.  That is, the more sensitive the epithelium becomes once it is damaged by the virus, the more the ensuing irritability> the more the cough, the more the secondary reflux (especially in those prone to reflux) the more the irritation of the sensitive mucosa and so on in a  Classic cyclical pattern.    Needs flutter valve as well to prevent traumatic cough /gag  Each maintenance medication was reviewed in detail including most importantly the difference between maintenance and as needed and under what circumstances the prns are to be used.  Please see instructions for details which were reviewed in writing and the patient given a copy.

## 2015-02-27 ENCOUNTER — Ambulatory Visit: Payer: 59 | Admitting: Internal Medicine

## 2015-02-28 ENCOUNTER — Telehealth: Payer: Self-pay | Admitting: Internal Medicine

## 2015-02-28 NOTE — Telephone Encounter (Signed)
Patient no showed for 6 month fu.  Please advise.

## 2015-02-28 NOTE — Telephone Encounter (Signed)
Please call her

## 2015-02-28 NOTE — Telephone Encounter (Signed)
Patient states her insurance is really bad and can not afford to come in.

## 2015-04-13 ENCOUNTER — Other Ambulatory Visit: Payer: Self-pay | Admitting: Obstetrics and Gynecology

## 2015-04-17 LAB — CYTOLOGY - PAP

## 2015-04-20 ENCOUNTER — Telehealth: Payer: Self-pay | Admitting: Internal Medicine

## 2015-04-20 MED ORDER — DOXYCYCLINE HYCLATE 100 MG PO TABS: ORAL_TABLET | ORAL | Status: DC

## 2015-04-20 NOTE — Telephone Encounter (Signed)
LMTC x 1  

## 2015-04-20 NOTE — Telephone Encounter (Signed)
Offer doxycycline 100 mg # 8, 2 today then one daily  Can try otc Delsym if needed for cough.

## 2015-04-20 NOTE — Telephone Encounter (Signed)
[phone] calling back

## 2015-04-20 NOTE — Telephone Encounter (Signed)
Called and spoke to pt. Informed her of the recs per CY. Rx sent to preferred pharmacy. Pt verbalized understanding and denied any further questions or concerns at this time.   

## 2015-04-20 NOTE — Telephone Encounter (Signed)
Spoke with pt, c/o increased sob, hoarseness, nonprod cough since this morning.  Denies fever, chest pain.   Pt is concerned that this will worsen over the weekend.   Pt has taken robitussin to help with symptoms  Pt uses [facility] on [address].   Sending to doc of day as MR is on vacation.  CY please advise.  Thanks!  Allergies  Allergen Reactions  . Ace Inhibitors     REACTION: cough (lisinopril)  . Penicillins     REACTION: hives  . Prednisone     REACTION: paresthesias  . Sulfonamide Derivatives     REACTION: hives   Current Outpatient Prescriptions on File Prior to Visit  Medication Sig Dispense Refill  . albuterol (PROAIR HFA) 108 (90 BASE) MCG/ACT inhaler Inhale 2 puffs into the lungs every 6 (six) hours as needed. 8 g 5  . aspirin 81 MG tablet Take 81 mg by mouth daily.      Marland Kitchen azithromycin (ZITHROMAX) 250 MG tablet Take 1 tablet (250 mg total) by mouth as directed. 6 tablet 0  . budesonide-formoterol (SYMBICORT) 80-4.5 MCG/ACT inhaler Pt states she is using 1 puff BID    . calcium carbonate (OS-CAL) 600 MG TABS Take 600 mg by mouth daily.     . Cholecalciferol (VITAMIN D3) 2000 UNITS TABS Take by mouth. Take 1 daily.    . fluticasone (FLONASE) 50 MCG/ACT nasal spray Place into both nostrils as needed.    . methylPREDNISolone (MEDROL DOSEPAK) 4 MG TBPK tablet follow package directions 21 tablet 0  . Multiple Vitamin (MULTIVITAMIN) tablet Take 1 tablet by mouth daily.    Jerelyn Charles OP Apply to eye. 2 drops to eye daily    . Omega-3 Fatty Acids (FISH OIL PO) Take 1 tablet by mouth daily.    Marland Kitchen omeprazole (PRILOSEC) 40 MG capsule Take 1 capsule (40 mg total) by mouth daily. 90 capsule 3  . Respiratory Therapy Supplies (FLUTTER) DEVI Use as directed 1 each 0  . traMADol (ULTRAM) 50 MG tablet 1-2 every 4 hours as needed for cough or pain 40 tablet 0   No current facility-administered medications on file prior to visit.

## 2015-04-26 ENCOUNTER — Telehealth: Payer: Self-pay | Admitting: Internal Medicine

## 2015-04-26 LAB — HM PAP SMEAR

## 2015-04-26 MED ORDER — BUDESONIDE-FORMOTEROL FUMARATE 80-4.5 MCG/ACT IN AERO: 2.0000 | INHALATION_SPRAY | Freq: Two times a day (BID) | RESPIRATORY_TRACT | Status: DC

## 2015-04-26 NOTE — Telephone Encounter (Signed)
Called pt and she is needing a refill on her symbicort 80 mcg. i have sent in RX. Nothing further needed

## 2015-05-08 LAB — HM MAMMOGRAPHY: HM Mammogram: NORMAL (ref 0–4)

## 2015-08-08 ENCOUNTER — Telehealth: Payer: Self-pay | Admitting: Internal Medicine

## 2015-08-08 NOTE — Telephone Encounter (Signed)
What would be cheaper for her? Breo? Dulera? Advair? Can she call her insurance and get list of cheapest or is it there in our computer?

## 2015-08-08 NOTE — Telephone Encounter (Signed)
Patient Returned call  [phone]

## 2015-08-08 NOTE — Telephone Encounter (Signed)
Patient wants to know if she can switch the Symbicort to something that is cheaper.  Patient says that she no longer has insurance to pay for her medication.  (she has a $6500 deductible)   MR - please advise.

## 2015-08-08 NOTE — Telephone Encounter (Signed)
Left message to call back  

## 2015-08-09 ENCOUNTER — Telehealth: Payer: Self-pay | Admitting: Internal Medicine

## 2015-08-09 NOTE — Telephone Encounter (Signed)
Brand Males, MD at 08/08/2015 5:35 PM     Status: Signed       Expand All Collapse All   What would be cheaper for her? Breo? Dulera? Advair? Can she call her insurance and get list of cheapest or is it there in our computer?       Spoke with pt. States that she will contact her pharmacy and see what's going to cheapest for her and contact us back. Will await call.

## 2015-08-09 NOTE — Telephone Encounter (Signed)
Called spoke with pt. Made aware of below. She will contact her insurance. Once she finds out something she will call back. Will sign off.

## 2015-08-10 MED ORDER — ALBUTEROL SULFATE HFA 108 (90 BASE) MCG/ACT IN AERS: 2.0000 | INHALATION_SPRAY | Freq: Four times a day (QID) | RESPIRATORY_TRACT | Status: DC | PRN

## 2015-08-10 NOTE — Telephone Encounter (Signed)
[phone], pt cb

## 2015-08-10 NOTE — Telephone Encounter (Signed)
Called and spoke to pt. Pt stated she check with her pharmacy and the [place], [place], and Advair are all about $275-300 for 30 day rx. Pt stated she has about 20 doses left of the symbicort and then she stated her husband has several extra Dulera 100 and she can start taking that after finishing the Symbicort. Pt is also requesting a rescue inhaler. Pt stated she has been doing very well with only taking 2 puffs of the Symbicort, pt denies any SOB.  MR please advise. Thanks.

## 2015-08-10 NOTE — Telephone Encounter (Signed)
Ok to take husband dulera. Ok to refill albuterol . Pls tell her it sounds like her cost is a function of high deductible health plan. This means tha once she hits a deductible cost will go down. Ask her what price point she hits deductible? Also tell her if indeed high deductible health plan she is kind of stuck till she gets into medicare unless she tries to get a n MDI from [place] or [place]

## 2015-08-10 NOTE — Telephone Encounter (Signed)
lmtcb for pt.  

## 2015-08-10 NOTE — Telephone Encounter (Signed)
Spoke with pt. She reports her deductible is about $6600. She will be on medicare in June 2017. She is aware okay to take the dulera. RX for albuterol sent in. Nothing further needed

## 2015-10-20 ENCOUNTER — Other Ambulatory Visit: Payer: Self-pay | Admitting: Internal Medicine

## 2015-10-20 NOTE — Telephone Encounter (Signed)
Pt has not been seen since 2015. She will need an OV in order til fill request

## 2015-10-20 NOTE — Telephone Encounter (Signed)
Informed pt on vm °

## 2015-10-23 ENCOUNTER — Telehealth: Payer: Self-pay | Admitting: Internal Medicine

## 2015-10-23 NOTE — Telephone Encounter (Signed)
Pt is hoping you can refill omeprazole (PRILOSEC) 40 MG capsule [id number]  until June when she gets on Medicare.  She has insurance with high premium and deductible. I went ahead and scheduled her a physical after her birthday. She says she has been feeling really good.  [facility] Please advise

## 2015-10-24 ENCOUNTER — Other Ambulatory Visit: Payer: Self-pay | Admitting: Internal Medicine

## 2015-10-25 NOTE — Telephone Encounter (Signed)
Pt has not been seen since 08/25/2014. She was a new pt at the time. Please Arnoldo Hooker if you are okay with this request

## 2015-10-26 NOTE — Telephone Encounter (Signed)
yes

## 2015-10-27 NOTE — Telephone Encounter (Signed)
Done by PCP.

## 2016-01-11 ENCOUNTER — Encounter: Payer: Self-pay | Admitting: Internal Medicine

## 2016-01-21 ENCOUNTER — Other Ambulatory Visit: Payer: Self-pay | Admitting: Internal Medicine

## 2016-03-07 ENCOUNTER — Ambulatory Visit (INDEPENDENT_AMBULATORY_CARE_PROVIDER_SITE_OTHER): Payer: Medicare HMO | Admitting: Internal Medicine

## 2016-03-07 ENCOUNTER — Encounter: Payer: Self-pay | Admitting: Internal Medicine

## 2016-03-07 ENCOUNTER — Telehealth: Payer: Self-pay

## 2016-03-07 ENCOUNTER — Other Ambulatory Visit (INDEPENDENT_AMBULATORY_CARE_PROVIDER_SITE_OTHER): Payer: Medicare HMO

## 2016-03-07 VITALS — BP 140/98 | HR 77 | Temp 98.3°F | Resp 16 | Ht 64.0 in | Wt 159.0 lb

## 2016-03-07 DIAGNOSIS — I10 Essential (primary) hypertension: Secondary | ICD-10-CM

## 2016-03-07 DIAGNOSIS — Z1159 Encounter for screening for other viral diseases: Secondary | ICD-10-CM

## 2016-03-07 DIAGNOSIS — Z23 Encounter for immunization: Secondary | ICD-10-CM | POA: Diagnosis not present

## 2016-03-07 DIAGNOSIS — E785 Hyperlipidemia, unspecified: Secondary | ICD-10-CM | POA: Diagnosis not present

## 2016-03-07 DIAGNOSIS — Z0000 Encounter for general adult medical examination without abnormal findings: Secondary | ICD-10-CM

## 2016-03-07 DIAGNOSIS — K219 Gastro-esophageal reflux disease without esophagitis: Secondary | ICD-10-CM

## 2016-03-07 DIAGNOSIS — J4540 Moderate persistent asthma, uncomplicated: Secondary | ICD-10-CM | POA: Diagnosis not present

## 2016-03-07 LAB — CBC WITH DIFFERENTIAL/PLATELET
BASOS PCT: 0.7 % (ref 0.0–3.0)
Basophils Absolute: 0 10*3/uL (ref 0.0–0.1)
EOS ABS: 0.6 10*3/uL (ref 0.0–0.7)
EOS PCT: 9.1 % — AB (ref 0.0–5.0)
HEMATOCRIT: 41.2 % (ref 36.0–46.0)
HEMOGLOBIN: 14.1 g/dL (ref 12.0–15.0)
LYMPHS PCT: 22.9 % (ref 12.0–46.0)
Lymphs Abs: 1.5 10*3/uL (ref 0.7–4.0)
MCHC: 34.1 g/dL (ref 30.0–36.0)
MCV: 88.3 fl (ref 78.0–100.0)
MONOS PCT: 10.2 % (ref 3.0–12.0)
Monocytes Absolute: 0.6 10*3/uL (ref 0.1–1.0)
Neutro Abs: 3.6 10*3/uL (ref 1.4–7.7)
Neutrophils Relative %: 57.1 % (ref 43.0–77.0)
Platelets: 244 10*3/uL (ref 150.0–400.0)
RBC: 4.67 Mil/uL (ref 3.87–5.11)
RDW: 14 % (ref 11.5–15.5)
WBC: 6.4 10*3/uL (ref 4.0–10.5)

## 2016-03-07 LAB — LIPID PANEL
CHOL/HDL RATIO: 3
CHOLESTEROL: 185 mg/dL (ref 0–200)
HDL: 56.7 mg/dL (ref >39.00)
LDL CALC: 108 mg/dL — AB (ref 0–99)
NonHDL: 128.29
TRIGLYCERIDES: 102 mg/dL (ref 0.0–149.0)
VLDL: 20.4 mg/dL (ref 0.0–40.0)

## 2016-03-07 LAB — COMPREHENSIVE METABOLIC PANEL
ALBUMIN: 4.4 g/dL (ref 3.5–5.2)
ALT: 15 U/L (ref 0–35)
AST: 21 U/L (ref 0–37)
Alkaline Phosphatase: 68 U/L (ref 39–117)
BUN: 25 mg/dL — ABNORMAL HIGH (ref 6–23)
CALCIUM: 9.9 mg/dL (ref 8.4–10.5)
CHLORIDE: 108 meq/L (ref 96–112)
CO2: 30 mEq/L (ref 19–32)
Creatinine, Ser: 0.87 mg/dL (ref 0.40–1.20)
GFR: 69.45 mL/min (ref >60.00)
Glucose, Bld: 85 mg/dL (ref 70–99)
POTASSIUM: 3.8 meq/L (ref 3.5–5.1)
Sodium: 143 mEq/L (ref 135–145)
Total Bilirubin: 0.5 mg/dL (ref 0.2–1.2)
Total Protein: 7.7 g/dL (ref 6.0–8.3)

## 2016-03-07 LAB — TSH: TSH: 2.63 u[IU]/mL (ref 0.35–4.50)

## 2016-03-07 MED ORDER — MOMETASONE FURO-FORMOTEROL FUM 100-5 MCG/ACT IN AERO: 2.0000 | INHALATION_SPRAY | Freq: Two times a day (BID) | RESPIRATORY_TRACT | Status: DC

## 2016-03-07 MED ORDER — AMLODIPINE BESYLATE 2.5 MG PO TABS: 2.5000 mg | ORAL_TABLET | Freq: Every day | ORAL | Status: DC

## 2016-03-07 NOTE — Telephone Encounter (Signed)
Patient called about the bp medication she use to be on. It is Amlodpine 2.5mg  1 pill daily. She wanted to know should she start taking this medication or what did you want her to do. Thank you.

## 2016-03-07 NOTE — Progress Notes (Signed)
Pre visit review using our clinic review tool, if applicable. No additional management support is needed unless otherwise documented below in the visit note. 

## 2016-03-07 NOTE — Telephone Encounter (Signed)
Notified pt w/MD response.../lmb 

## 2016-03-07 NOTE — Progress Notes (Signed)
Subjective:  Patient ID: Tracey Boyd, female    DOB: 07-24-1951  Age: 65 y.o. MRN: [id number]  CC: Annual Exam; Hypertension; and Hyperlipidemia   HPI [name] presents for a CPX.  She is overdue for blood pressure check. She was treated previously treated with amlodipine and had a good response to it with no side effects. She has had a few spikes in her blood pressure at home up to 170/90 but she denies headache, blurred vision, chest pain, shortness of breath, palpitations, dyspnea on exertion, edema, or fatigue. She has had a few episodes of dizziness.   She also needs advice on an inhaler for asthma. I previously prescribed Symbicort but she ran out and has been using her husband's [facility] which she liked quite a bit. She would like to stay on [facility]. She has rare episodes of wheezing and she denies any recent cough, hemoptysis, night sweats, shortness of breath.  Past Medical History  Diagnosis Date  . Asthma   . GERD (gastroesophageal reflux disease)   . HTN (hypertension)   . Anxiety   . Dermatitis herpetiformis    Past Surgical History  Procedure Laterality Date  . Appendectomy    . Cystectomy      reports that she has never smoked. She has never used smokeless tobacco. She reports that she does not drink alcohol or use illicit drugs. family history includes Alcohol abuse in her father; Colon polyps in her mother; Lung cancer in her mother; Multiple sclerosis in her mother and sister. There is no history of Colon cancer. Allergies  Allergen Reactions  . Ace Inhibitors     REACTION: cough (lisinopril)  . Penicillins     REACTION: hives  . Prednisone     REACTION: paresthesias  . Sulfonamide Derivatives     REACTION: hives    Outpatient Prescriptions Prior to Visit  Medication Sig Dispense Refill  . aspirin 81 MG tablet Take 81 mg by mouth daily.      . Cholecalciferol (VITAMIN D3) 2000 UNITS TABS Take by mouth. Take 1 daily.    Marland Kitchen omeprazole (PRILOSEC) 40  MG capsule TAKE 1 CAPSULE BY MOUTH DAILY 90 capsule 0  . Respiratory Therapy Supplies (FLUTTER) DEVI Use as directed 1 each 0  . azithromycin (ZITHROMAX) 250 MG tablet Take 1 tablet (250 mg total) by mouth as directed. 6 tablet 0  . calcium carbonate (OS-CAL) 600 MG TABS Take 600 mg by mouth daily.     . fluticasone (FLONASE) 50 MCG/ACT nasal spray Place into both nostrils as needed.    . Multiple Vitamin (MULTIVITAMIN) tablet Take 1 tablet by mouth daily.    Jerelyn Charles OP Apply to eye. 2 drops to eye daily    . traMADol (ULTRAM) 50 MG tablet 1-2 every 4 hours as needed for cough or pain 40 tablet 0  . albuterol (PROAIR HFA) 108 (90 BASE) MCG/ACT inhaler Inhale 2 puffs into the lungs every 6 (six) hours as needed. (Patient not taking: Reported on 03/07/2016) 8 g 5  . budesonide-formoterol (SYMBICORT) 80-4.5 MCG/ACT inhaler Inhale 2 puffs into the lungs 2 (two) times daily. D (Patient not taking: Reported on 03/07/2016) 1 Inhaler 1  . doxycycline (VIBRA-TABS) 100 MG tablet Take 2 tablets today then 1 tablet daily till gone. 8 tablet 0  . methylPREDNISolone (MEDROL DOSEPAK) 4 MG TBPK tablet follow package directions 21 tablet 0  . Omega-3 Fatty Acids (FISH OIL PO) Take 1 tablet by mouth daily.     No  facility-administered medications prior to visit.    ROS Review of Systems  Constitutional: Negative.  Negative for fever, chills, appetite change, fatigue and unexpected weight change.  HENT: Negative.   Eyes: Negative.   Respiratory: Positive for wheezing. Negative for cough, choking, chest tightness, shortness of breath and stridor.   Cardiovascular: Negative.  Negative for chest pain, palpitations and leg swelling.  Gastrointestinal: Negative.  Negative for nausea, vomiting, abdominal pain, diarrhea, constipation and blood in stool.  Endocrine: Negative.   Genitourinary: Negative.  Negative for difficulty urinating.  Musculoskeletal: Negative.  Negative for myalgias, back pain,  joint swelling and arthralgias.  Skin: Negative.  Negative for color change and rash.  Allergic/Immunologic: Negative.   Neurological: Positive for dizziness.  Hematological: Negative.  Negative for adenopathy. Does not bruise/bleed easily.  Psychiatric/Behavioral: Negative.     Objective:  BP 140/98 mmHg  Pulse 77  Temp(Src) 98.3 F (36.8 C) (Oral)  Resp 16  Ht 5\' 4"  (1.626 m)  Wt 159 lb (72.122 kg)  BMI 27.28 kg/m2  SpO2 97%  BP Readings from Last 3 Encounters:  03/07/16 140/98  01/23/15 104/80  08/25/14 124/82    Wt Readings from Last 3 Encounters:  03/07/16 159 lb (72.122 kg)  01/23/15 156 lb 3.2 oz (70.852 kg)  08/25/14 160 lb (72.576 kg)    Physical Exam  Constitutional: She is oriented to person, place, and time. She appears well-developed and well-nourished. No distress.  HENT:  Head: Normocephalic and atraumatic.  Mouth/Throat: Oropharynx is clear and moist. No oropharyngeal exudate.  Eyes: Conjunctivae are normal. Right eye exhibits no discharge. Left eye exhibits no discharge. No scleral icterus.  Neck: Normal range of motion. Neck supple. No JVD present. No tracheal deviation present. No thyromegaly present.  Cardiovascular: Normal rate, regular rhythm, normal heart sounds and intact distal pulses.  Exam reveals no gallop and no friction rub.   No murmur heard. Pulses:      Carotid pulses are 1+ on the right side, and 1+ on the left side.      Radial pulses are 1+ on the right side, and 1+ on the left side.       Femoral pulses are 1+ on the right side, and 1+ on the left side.      Popliteal pulses are 1+ on the right side, and 1+ on the left side.       Dorsalis pedis pulses are 1+ on the right side, and 1+ on the left side.       Posterior tibial pulses are 1+ on the right side, and 1+ on the left side.  EKG ----  Sinus  Rhythm  WITHIN NORMAL LIMITS  Pulmonary/Chest: Effort normal and breath sounds normal. No stridor. No respiratory distress. She has  no wheezes. She has no rales. She exhibits no tenderness.  Abdominal: Soft. Bowel sounds are normal. She exhibits no distension and no mass. There is no tenderness. There is no rebound and no guarding.  Musculoskeletal: Normal range of motion. She exhibits no edema or tenderness.  Lymphadenopathy:    She has no cervical adenopathy.  Neurological: She is oriented to person, place, and time.  Skin: Skin is warm and dry. No rash noted. She is not diaphoretic. No erythema. No pallor.  Psychiatric: She has a normal mood and affect. Her behavior is normal. Judgment and thought content normal.  Vitals reviewed.   Lab Results  Component Value Date   WBC 6.4 03/07/2016   HGB 14.1 03/07/2016  HCT 41.2 03/07/2016   PLT 244.0 03/07/2016   GLUCOSE 85 03/07/2016   CHOL 185 03/07/2016   TRIG 102.0 03/07/2016   HDL 56.70 03/07/2016   LDLCALC 108* 03/07/2016   ALT 15 03/07/2016   AST 21 03/07/2016   NA 143 03/07/2016   K 3.8 03/07/2016   CL 108 03/07/2016   CREATININE 0.87 03/07/2016   BUN 25* 03/07/2016   CO2 30 03/07/2016   TSH 2.63 03/07/2016    Mm Digital Diagnostic Unilat R  06/29/2013  CLINICAL DATA:  Focal pain right breast which has resolved. EXAM: DIGITAL DIAGNOSTIC  right MAMMOGRAM with CAD DIGITAL BREAST TOMOSYNTHESIS Digital breast tomosynthesis images are acquired in two projections. These images are reviewed in combination with the digital mammogram, confirming the findings below. COMPARISON:  March 01, 2013, February 24, 2012, Feb 07, 2011 ACR Breast Density Category c: The breast tissue is heterogeneously dense, which may obscure small masses. FINDINGS: Cc and MLO views of the right breast are submitted. No suspicious abnormality is identified in the right breast. Mammographic images were processed with CAD. IMPRESSION: Benign findings. RECOMMENDATION: Routine screening mammogram back on schedule. I have discussed the findings and recommendations with the patient. Results were also  provided in writing at the conclusion of the visit. If applicable, a reminder letter will be sent to the patient regarding the next appointment. BI-RADS CATEGORY  2: Benign Finding(s) Electronically Signed   By: Abelardo Diesel M.D.   On: 06/29/2013 10:32    Assessment & Plan:   Mahita was seen today for annual exam, hypertension and hyperlipidemia.  Diagnoses and all orders for this visit:  Essential hypertension- her lab work is negative for any secondary metabolic causes for hypertension or end organ damage, her EKG is normal, will restart amlodipine at low dose and increase the dose or add to this as needed. -     Comprehensive metabolic panel; Future -     CBC with Differential/Platelet; Future -     TSH; Future -     EKG 12-Lead -     amLODipine (NORVASC) 2.5 MG tablet; Take 1 tablet (2.5 mg total) by mouth daily.  Hyperlipidemia with target LDL less than 130- she has achieved her LDL goal and statin therapy is not indicated -     Lipid panel; Future -     TSH; Future  Need for hepatitis C screening test -     Hepatitis C antibody; Future  Asthma, moderate persistent, well-controlled- I will discontinue the Symbicort, I tried to prescribe Dulera but it is not approved on her insurance have asked her to use Breo -     Discontinue: mometasone-formoterol (DULERA) 100-5 MCG/ACT AERO; Inhale 2 puffs into the lungs 2 (two) times daily. -     fluticasone furoate-vilanterol (BREO ELLIPTA) 200-25 MCG/INH AEPB; Inhale 1 puff into the lungs daily.  Gastroesophageal reflux disease without esophagitis- her symptoms are well controlled with a PPI  Need for vaccination with 13-polyvalent pneumococcal conjugate vaccine -     Pneumococcal conjugate vaccine 13-valent   I have discontinued Ms. Monteith's calcium carbonate, multivitamin, fluticasone, NEOMYCIN-POLYMYXIN-HC OP, azithromycin, Omega-3 Fatty Acids (FISH OIL PO), methylPREDNISolone, traMADol, doxycycline, budesonide-formoterol, and  mometasone-formoterol. I am also having her start on amLODipine and fluticasone furoate-vilanterol. Additionally, I am having her maintain her aspirin, Vitamin D3, FLUTTER, albuterol, and omeprazole.  Meds ordered this encounter  Medications  . DISCONTD: mometasone-formoterol (DULERA) 100-5 MCG/ACT AERO    Sig: Inhale 2 puffs into the lungs 2 (  two) times daily.    Dispense:  13 g    Refill:  11  . amLODipine (NORVASC) 2.5 MG tablet    Sig: Take 1 tablet (2.5 mg total) by mouth daily.    Dispense:  90 tablet    Refill:  3  . fluticasone furoate-vilanterol (BREO ELLIPTA) 200-25 MCG/INH AEPB    Sig: Inhale 1 puff into the lungs daily.    Dispense:  30 each    Refill:  11   See AVS for instructions about healthy living and anticipatory guidance.   Follow-up: Return in about 6 months (around 09/06/2016).  Scarlette Calico, MD

## 2016-03-07 NOTE — Telephone Encounter (Signed)
Yes, I have sent the prescription to her pharmacy

## 2016-03-07 NOTE — Patient Instructions (Signed)
Preventive Care for Adults, Female A healthy lifestyle and preventive care can promote health and wellness. Preventive health guidelines for women include the following key practices.  A routine yearly physical is a good way to check with your health care provider about your health and preventive screening. It is a chance to share any concerns and updates on your health and to receive a thorough exam.  Visit your dentist for a routine exam and preventive care every 6 months. Brush your teeth twice a day and floss once a day. Good oral hygiene prevents tooth decay and gum disease.  The frequency of eye exams is based on your age, health, family medical history, use of contact lenses, and other factors. Follow your health care provider's recommendations for frequency of eye exams.  Eat a healthy diet. Foods like vegetables, fruits, whole grains, low-fat dairy products, and lean protein foods contain the nutrients you need without too many calories. Decrease your intake of foods high in solid fats, added sugars, and salt. Eat the right amount of calories for you.Get information about a proper diet from your health care provider, if necessary.  Regular physical exercise is one of the most important things you can do for your health. Most adults should get at least 150 minutes of moderate-intensity exercise (any activity that increases your heart rate and causes you to sweat) each week. In addition, most adults need muscle-strengthening exercises on 2 or more days a week.  Maintain a healthy weight. The body mass index (BMI) is a screening tool to identify possible weight problems. It provides an estimate of body fat based on height and weight. Your health care provider can find your BMI and can help you achieve or maintain a healthy weight.For adults 20 years and older:  A BMI below 18.5 is considered underweight.  A BMI of 18.5 to 24.9 is normal.  A BMI of 25 to 29.9 is considered overweight.  A  BMI of 30 and above is considered obese.  Maintain normal blood lipids and cholesterol levels by exercising and minimizing your intake of saturated fat. Eat a balanced diet with plenty of fruit and vegetables. Blood tests for lipids and cholesterol should begin at age 45 and be repeated every 5 years. If your lipid or cholesterol levels are high, you are over 50, or you are at high risk for heart disease, you may need your cholesterol levels checked more frequently.Ongoing high lipid and cholesterol levels should be treated with medicines if diet and exercise are not working.  If you smoke, find out from your health care provider how to quit. If you do not use tobacco, do not start.  Lung cancer screening is recommended for adults aged 45-80 years who are at high risk for developing lung cancer because of a history of smoking. A yearly low-dose CT scan of the lungs is recommended for people who have at least a 30-pack-year history of smoking and are a current smoker or have quit within the past 15 years. A pack year of smoking is smoking an average of 1 pack of cigarettes a day for 1 year (for example: 1 pack a day for 30 years or 2 packs a day for 15 years). Yearly screening should continue until the smoker has stopped smoking for at least 15 years. Yearly screening should be stopped for people who develop a health problem that would prevent them from having lung cancer treatment.  If you are pregnant, do not drink alcohol. If you are  breastfeeding, be very cautious about drinking alcohol. If you are not pregnant and choose to drink alcohol, do not have more than 1 drink per day. One drink is considered to be 12 ounces (355 mL) of beer, 5 ounces (148 mL) of wine, or 1.5 ounces (44 mL) of liquor.  Avoid use of street drugs. Do not share needles with anyone. Ask for help if you need support or instructions about stopping the use of drugs.  High blood pressure causes heart disease and increases the risk  of stroke. Your blood pressure should be checked at least every 1 to 2 years. Ongoing high blood pressure should be treated with medicines if weight loss and exercise do not work.  If you are 55-79 years old, ask your health care provider if you should take aspirin to prevent strokes.  Diabetes screening is done by taking a blood sample to check your blood glucose level after you have not eaten for a certain period of time (fasting). If you are not overweight and you do not have risk factors for diabetes, you should be screened once every 3 years starting at age 45. If you are overweight or obese and you are 40-70 years of age, you should be screened for diabetes every year as part of your cardiovascular risk assessment.  Breast cancer screening is essential preventive care for women. You should practice "breast self-awareness." This means understanding the normal appearance and feel of your breasts and may include breast self-examination. Any changes detected, no matter how small, should be reported to a health care provider. Women in their 20s and 30s should have a clinical breast exam (CBE) by a health care provider as part of a regular health exam every 1 to 3 years. After age 40, women should have a CBE every year. Starting at age 40, women should consider having a mammogram (breast X-ray test) every year. Women who have a family history of breast cancer should talk to their health care provider about genetic screening. Women at a high risk of breast cancer should talk to their health care providers about having an MRI and a mammogram every year.  Breast cancer gene (BRCA)-related cancer risk assessment is recommended for women who have family members with BRCA-related cancers. BRCA-related cancers include breast, ovarian, tubal, and peritoneal cancers. Having family members with these cancers may be associated with an increased risk for harmful changes (mutations) in the breast cancer genes BRCA1 and  BRCA2. Results of the assessment will determine the need for genetic counseling and BRCA1 and BRCA2 testing.  Your health care provider may recommend that you be screened regularly for cancer of the pelvic organs (ovaries, uterus, and vagina). This screening involves a pelvic examination, including checking for microscopic changes to the surface of your cervix (Pap test). You may be encouraged to have this screening done every 3 years, beginning at age 21.  For women ages 30-65, health care providers may recommend pelvic exams and Pap testing every 3 years, or they may recommend the Pap and pelvic exam, combined with testing for human papilloma virus (HPV), every 5 years. Some types of HPV increase your risk of cervical cancer. Testing for HPV may also be done on women of any age with unclear Pap test results.  Other health care providers may not recommend any screening for nonpregnant women who are considered low risk for pelvic cancer and who do not have symptoms. Ask your health care provider if a screening pelvic exam is right for   you.  If you have had past treatment for cervical cancer or a condition that could lead to cancer, you need Pap tests and screening for cancer for at least 20 years after your treatment. If Pap tests have been discontinued, your risk factors (such as having a new sexual partner) need to be reassessed to determine if screening should resume. Some women have medical problems that increase the chance of getting cervical cancer. In these cases, your health care provider may recommend more frequent screening and Pap tests.  Colorectal cancer can be detected and often prevented. Most routine colorectal cancer screening begins at the age of 50 years and continues through age 75 years. However, your health care provider may recommend screening at an earlier age if you have risk factors for colon cancer. On a yearly basis, your health care provider may provide home test kits to check  for hidden blood in the stool. Use of a small camera at the end of a tube, to directly examine the colon (sigmoidoscopy or colonoscopy), can detect the earliest forms of colorectal cancer. Talk to your health care provider about this at age 50, when routine screening begins. Direct exam of the colon should be repeated every 5-10 years through age 75 years, unless early forms of precancerous polyps or small growths are found.  People who are at an increased risk for hepatitis B should be screened for this virus. You are considered at high risk for hepatitis B if:  You were born in a country where hepatitis B occurs often. Talk with your health care provider about which countries are considered high risk.  Your parents were born in a high-risk country and you have not received a shot to protect against hepatitis B (hepatitis B vaccine).  You have HIV or AIDS.  You use needles to inject street drugs.  You live with, or have sex with, someone who has hepatitis B.  You get hemodialysis treatment.  You take certain medicines for conditions like cancer, organ transplantation, and autoimmune conditions.  Hepatitis C blood testing is recommended for all people born from 1945 through 1965 and any individual with known risks for hepatitis C.  Practice safe sex. Use condoms and avoid high-risk sexual practices to reduce the spread of sexually transmitted infections (STIs). STIs include gonorrhea, chlamydia, syphilis, trichomonas, herpes, HPV, and human immunodeficiency virus (HIV). Herpes, HIV, and HPV are viral illnesses that have no cure. They can result in disability, cancer, and death.  You should be screened for sexually transmitted illnesses (STIs) including gonorrhea and chlamydia if:  You are sexually active and are younger than 24 years.  You are older than 24 years and your health care provider tells you that you are at risk for this type of infection.  Your sexual activity has changed  since you were last screened and you are at an increased risk for chlamydia or gonorrhea. Ask your health care provider if you are at risk.  If you are at risk of being infected with HIV, it is recommended that you take a prescription medicine daily to prevent HIV infection. This is called preexposure prophylaxis (PrEP). You are considered at risk if:  You are sexually active and do not regularly use condoms or know the HIV status of your partner(s).  You take drugs by injection.  You are sexually active with a partner who has HIV.  Talk with your health care provider about whether you are at high risk of being infected with HIV. If   you choose to begin PrEP, you should first be tested for HIV. You should then be tested every 3 months for as long as you are taking PrEP.  Osteoporosis is a disease in which the bones lose minerals and strength with aging. This can result in serious bone fractures or breaks. The risk of osteoporosis can be identified using a bone density scan. Women ages 67 years and over and women at risk for fractures or osteoporosis should discuss screening with their health care providers. Ask your health care provider whether you should take a calcium supplement or vitamin D to reduce the rate of osteoporosis.  Menopause can be associated with physical symptoms and risks. Hormone replacement therapy is available to decrease symptoms and risks. You should talk to your health care provider about whether hormone replacement therapy is right for you.  Use sunscreen. Apply sunscreen liberally and repeatedly throughout the day. You should seek shade when your shadow is shorter than you. Protect yourself by wearing long sleeves, pants, a wide-brimmed hat, and sunglasses year round, whenever you are outdoors.  Once a month, do a whole body skin exam, using a mirror to look at the skin on your back. Tell your health care provider of new moles, moles that have irregular borders, moles that  are larger than a pencil eraser, or moles that have changed in shape or color.  Stay current with required vaccines (immunizations).  Influenza vaccine. All adults should be immunized every year.  Tetanus, diphtheria, and acellular pertussis (Td, Tdap) vaccine. Pregnant women should receive 1 dose of Tdap vaccine during each pregnancy. The dose should be obtained regardless of the length of time since the last dose. Immunization is preferred during the 27th-36th week of gestation. An adult who has not previously received Tdap or who does not know her vaccine status should receive 1 dose of Tdap. This initial dose should be followed by tetanus and diphtheria toxoids (Td) booster doses every 10 years. Adults with an unknown or incomplete history of completing a 3-dose immunization series with Td-containing vaccines should begin or complete a primary immunization series including a Tdap dose. Adults should receive a Td booster every 10 years.  Varicella vaccine. An adult without evidence of immunity to varicella should receive 2 doses or a second dose if she has previously received 1 dose. Pregnant females who do not have evidence of immunity should receive the first dose after pregnancy. This first dose should be obtained before leaving the health care facility. The second dose should be obtained 4-8 weeks after the first dose.  Human papillomavirus (HPV) vaccine. Females aged 13-26 years who have not received the vaccine previously should obtain the 3-dose series. The vaccine is not recommended for use in pregnant females. However, pregnancy testing is not needed before receiving a dose. If a female is found to be pregnant after receiving a dose, no treatment is needed. In that case, the remaining doses should be delayed until after the pregnancy. Immunization is recommended for any person with an immunocompromised condition through the age of 61 years if she did not get any or all doses earlier. During the  3-dose series, the second dose should be obtained 4-8 weeks after the first dose. The third dose should be obtained 24 weeks after the first dose and 16 weeks after the second dose.  Zoster vaccine. One dose is recommended for adults aged 30 years or older unless certain conditions are present.  Measles, mumps, and rubella (MMR) vaccine. Adults born  before 1957 generally are considered immune to measles and mumps. Adults born in 1957 or later should have 1 or more doses of MMR vaccine unless there is a contraindication to the vaccine or there is laboratory evidence of immunity to each of the three diseases. A routine second dose of MMR vaccine should be obtained at least 28 days after the first dose for students attending postsecondary schools, health care workers, or international travelers. People who received inactivated measles vaccine or an unknown type of measles vaccine during 1963-1967 should receive 2 doses of MMR vaccine. People who received inactivated mumps vaccine or an unknown type of mumps vaccine before 1979 and are at high risk for mumps infection should consider immunization with 2 doses of MMR vaccine. For females of childbearing age, rubella immunity should be determined. If there is no evidence of immunity, females who are not pregnant should be vaccinated. If there is no evidence of immunity, females who are pregnant should delay immunization until after pregnancy. Unvaccinated health care workers born before 1957 who lack laboratory evidence of measles, mumps, or rubella immunity or laboratory confirmation of disease should consider measles and mumps immunization with 2 doses of MMR vaccine or rubella immunization with 1 dose of MMR vaccine.  Pneumococcal 13-valent conjugate (PCV13) vaccine. When indicated, a person who is uncertain of his immunization history and has no record of immunization should receive the PCV13 vaccine. All adults 65 years of age and older should receive this  vaccine. An adult aged 19 years or older who has certain medical conditions and has not been previously immunized should receive 1 dose of PCV13 vaccine. This PCV13 should be followed with a dose of pneumococcal polysaccharide (PPSV23) vaccine. Adults who are at high risk for pneumococcal disease should obtain the PPSV23 vaccine at least 8 weeks after the dose of PCV13 vaccine. Adults older than 65 years of age who have normal immune system function should obtain the PPSV23 vaccine dose at least 1 year after the dose of PCV13 vaccine.  Pneumococcal polysaccharide (PPSV23) vaccine. When PCV13 is also indicated, PCV13 should be obtained first. All adults aged 65 years and older should be immunized. An adult younger than age 65 years who has certain medical conditions should be immunized. Any person who resides in a nursing home or long-term care facility should be immunized. An adult smoker should be immunized. People with an immunocompromised condition and certain other conditions should receive both PCV13 and PPSV23 vaccines. People with human immunodeficiency virus (HIV) infection should be immunized as soon as possible after diagnosis. Immunization during chemotherapy or radiation therapy should be avoided. Routine use of PPSV23 vaccine is not recommended for American Indians, [place] Natives, or people younger than 65 years unless there are medical conditions that require PPSV23 vaccine. When indicated, people who have unknown immunization and have no record of immunization should receive PPSV23 vaccine. One-time revaccination 5 years after the first dose of PPSV23 is recommended for people aged 19-64 years who have chronic kidney failure, nephrotic syndrome, asplenia, or immunocompromised conditions. People who received 1-2 doses of PPSV23 before age 65 years should receive another dose of PPSV23 vaccine at age 65 years or later if at least 5 years have passed since the previous dose. Doses of PPSV23 are not  needed for people immunized with PPSV23 at or after age 65 years.  Meningococcal vaccine. Adults with asplenia or persistent complement component deficiencies should receive 2 doses of quadrivalent meningococcal conjugate (MenACWY-D) vaccine. The doses should be obtained   at least 2 months apart. Microbiologists working with certain meningococcal bacteria, [place] recruits, people at risk during an outbreak, and people who travel to or live in countries with a high rate of meningitis should be immunized. A first-year college student up through age 34 years who is living in a residence hall should receive a dose if she did not receive a dose on or after her 16th birthday. Adults who have certain high-risk conditions should receive one or more doses of vaccine.  Hepatitis A vaccine. Adults who wish to be protected from this disease, have certain high-risk conditions, work with hepatitis A-infected animals, work in hepatitis A research labs, or travel to or work in countries with a high rate of hepatitis A should be immunized. Adults who were previously unvaccinated and who anticipate close contact with an international adoptee during the first 60 days after arrival in the [place] from a country with a high rate of hepatitis A should be immunized.  Hepatitis B vaccine. Adults who wish to be protected from this disease, have certain high-risk conditions, may be exposed to blood or other infectious body fluids, are household contacts or sex partners of hepatitis B positive people, are clients or workers in certain care facilities, or travel to or work in countries with a high rate of hepatitis B should be immunized.  Haemophilus influenzae type b (Hib) vaccine. A previously unvaccinated person with asplenia or sickle cell disease or having a scheduled splenectomy should receive 1 dose of Hib vaccine. Regardless of previous immunization, a recipient of a hematopoietic stem cell transplant should receive a  3-dose series 6-12 months after her successful transplant. Hib vaccine is not recommended for adults with HIV infection. Preventive Services / Frequency Ages 35 to 4 years  Blood pressure check.** / Every 3-5 years.  Lipid and cholesterol check.** / Every 5 years beginning at age 60.  Clinical breast exam.** / Every 3 years for women in their 71s and 10s.  BRCA-related cancer risk assessment.** / For women who have family members with a BRCA-related cancer (breast, ovarian, tubal, or peritoneal cancers).  Pap test.** / Every 2 years from ages 76 through 26. Every 3 years starting at age 61 through age 76 or 93 with a history of 3 consecutive normal Pap tests.  HPV screening.** / Every 3 years from ages 37 through ages 60 to 51 with a history of 3 consecutive normal Pap tests.  Hepatitis C blood test.** / For any individual with known risks for hepatitis C.  Skin self-exam. / Monthly.  Influenza vaccine. / Every year.  Tetanus, diphtheria, and acellular pertussis (Tdap, Td) vaccine.** / Consult your health care provider. Pregnant women should receive 1 dose of Tdap vaccine during each pregnancy. 1 dose of Td every 10 years.  Varicella vaccine.** / Consult your health care provider. Pregnant females who do not have evidence of immunity should receive the first dose after pregnancy.  HPV vaccine. / 3 doses over 6 months, if 93 and younger. The vaccine is not recommended for use in pregnant females. However, pregnancy testing is not needed before receiving a dose.  Measles, mumps, rubella (MMR) vaccine.** / You need at least 1 dose of MMR if you were born in 1957 or later. You may also need a 2nd dose. For females of childbearing age, rubella immunity should be determined. If there is no evidence of immunity, females who are not pregnant should be vaccinated. If there is no evidence of immunity, females who are  pregnant should delay immunization until after pregnancy.  Pneumococcal  13-valent conjugate (PCV13) vaccine.** / Consult your health care provider.  Pneumococcal polysaccharide (PPSV23) vaccine.** / 1 to 2 doses if you smoke cigarettes or if you have certain conditions.  Meningococcal vaccine.** / 1 dose if you are age 68 to 8 years and a Market researcher living in a residence hall, or have one of several medical conditions, you need to get vaccinated against meningococcal disease. You may also need additional booster doses.  Hepatitis A vaccine.** / Consult your health care provider.  Hepatitis B vaccine.** / Consult your health care provider.  Haemophilus influenzae type b (Hib) vaccine.** / Consult your health care provider. Ages 7 to 53 years  Blood pressure check.** / Every year.  Lipid and cholesterol check.** / Every 5 years beginning at age 25 years.  Lung cancer screening. / Every year if you are aged 11-80 years and have a 30-pack-year history of smoking and currently smoke or have quit within the past 15 years. Yearly screening is stopped once you have quit smoking for at least 15 years or develop a health problem that would prevent you from having lung cancer treatment.  Clinical breast exam.** / Every year after age 48 years.  BRCA-related cancer risk assessment.** / For women who have family members with a BRCA-related cancer (breast, ovarian, tubal, or peritoneal cancers).  Mammogram.** / Every year beginning at age 41 years and continuing for as long as you are in good health. Consult with your health care provider.  Pap test.** / Every 3 years starting at age 65 years through age 37 or 70 years with a history of 3 consecutive normal Pap tests.  HPV screening.** / Every 3 years from ages 72 years through ages 60 to 40 years with a history of 3 consecutive normal Pap tests.  Fecal occult blood test (FOBT) of stool. / Every year beginning at age 21 years and continuing until age 5 years. You may not need to do this test if you get  a colonoscopy every 10 years.  Flexible sigmoidoscopy or colonoscopy.** / Every 5 years for a flexible sigmoidoscopy or every 10 years for a colonoscopy beginning at age 35 years and continuing until age 48 years.  Hepatitis C blood test.** / For all people born from [birth year] through 1965 and any individual with known risks for hepatitis C.  Skin self-exam. / Monthly.  Influenza vaccine. / Every year.  Tetanus, diphtheria, and acellular pertussis (Tdap/Td) vaccine.** / Consult your health care provider. Pregnant women should receive 1 dose of Tdap vaccine during each pregnancy. 1 dose of Td every 10 years.  Varicella vaccine.** / Consult your health care provider. Pregnant females who do not have evidence of immunity should receive the first dose after pregnancy.  Zoster vaccine.** / 1 dose for adults aged 30 years or older.  Measles, mumps, rubella (MMR) vaccine.** / You need at least 1 dose of MMR if you were born in 1957 or later. You may also need a second dose. For females of childbearing age, rubella immunity should be determined. If there is no evidence of immunity, females who are not pregnant should be vaccinated. If there is no evidence of immunity, females who are pregnant should delay immunization until after pregnancy.  Pneumococcal 13-valent conjugate (PCV13) vaccine.** / Consult your health care provider.  Pneumococcal polysaccharide (PPSV23) vaccine.** / 1 to 2 doses if you smoke cigarettes or if you have certain conditions.  Meningococcal vaccine.** /  Consult your health care provider.  Hepatitis A vaccine.** / Consult your health care provider.  Hepatitis B vaccine.** / Consult your health care provider.  Haemophilus influenzae type b (Hib) vaccine.** / Consult your health care provider. Ages 64 years and over  Blood pressure check.** / Every year.  Lipid and cholesterol check.** / Every 5 years beginning at age 23 years.  Lung cancer screening. / Every year if you  are aged 16-80 years and have a 30-pack-year history of smoking and currently smoke or have quit within the past 15 years. Yearly screening is stopped once you have quit smoking for at least 15 years or develop a health problem that would prevent you from having lung cancer treatment.  Clinical breast exam.** / Every year after age 74 years.  BRCA-related cancer risk assessment.** / For women who have family members with a BRCA-related cancer (breast, ovarian, tubal, or peritoneal cancers).  Mammogram.** / Every year beginning at age 44 years and continuing for as long as you are in good health. Consult with your health care provider.  Pap test.** / Every 3 years starting at age 58 years through age 22 or 39 years with 3 consecutive normal Pap tests. Testing can be stopped between 65 and 70 years with 3 consecutive normal Pap tests and no abnormal Pap or HPV tests in the past 10 years.  HPV screening.** / Every 3 years from ages 64 years through ages 70 or 61 years with a history of 3 consecutive normal Pap tests. Testing can be stopped between 65 and 70 years with 3 consecutive normal Pap tests and no abnormal Pap or HPV tests in the past 10 years.  Fecal occult blood test (FOBT) of stool. / Every year beginning at age 40 years and continuing until age 27 years. You may not need to do this test if you get a colonoscopy every 10 years.  Flexible sigmoidoscopy or colonoscopy.** / Every 5 years for a flexible sigmoidoscopy or every 10 years for a colonoscopy beginning at age 7 years and continuing until age 32 years.  Hepatitis C blood test.** / For all people born from [birth year] through 1965 and any individual with known risks for hepatitis C.  Osteoporosis screening.** / A one-time screening for women ages 30 years and over and women at risk for fractures or osteoporosis.  Skin self-exam. / Monthly.  Influenza vaccine. / Every year.  Tetanus, diphtheria, and acellular pertussis (Tdap/Td)  vaccine.** / 1 dose of Td every 10 years.  Varicella vaccine.** / Consult your health care provider.  Zoster vaccine.** / 1 dose for adults aged 35 years or older.  Pneumococcal 13-valent conjugate (PCV13) vaccine.** / Consult your health care provider.  Pneumococcal polysaccharide (PPSV23) vaccine.** / 1 dose for all adults aged 46 years and older.  Meningococcal vaccine.** / Consult your health care provider.  Hepatitis A vaccine.** / Consult your health care provider.  Hepatitis B vaccine.** / Consult your health care provider.  Haemophilus influenzae type b (Hib) vaccine.** / Consult your health care provider. ** Family history and personal history of risk and conditions may change your health care provider's recommendations.   This information is not intended to replace advice given to you by your health care provider. Make sure you discuss any questions you have with your health care provider.   Document Released: 10/22/2001 Document Revised: 09/16/2014 Document Reviewed: 01/21/2011 Elsevier Interactive Patient Education [employer].

## 2016-03-08 ENCOUNTER — Telehealth: Payer: Self-pay

## 2016-03-08 ENCOUNTER — Encounter: Payer: Self-pay | Admitting: Internal Medicine

## 2016-03-08 LAB — HEPATITIS C ANTIBODY: HCV Ab: NEGATIVE

## 2016-03-08 MED ORDER — FLUTICASONE FUROATE-VILANTEROL 200-25 MCG/INH IN AEPB: 1.0000 | INHALATION_SPRAY | Freq: Every day | RESPIRATORY_TRACT | Status: DC

## 2016-03-08 NOTE — Telephone Encounter (Signed)
Detailed message left on pt's personal VM advising of the Rx change and available samples.  Pt asked to call back if she does want to pick up samples

## 2016-03-08 NOTE — Telephone Encounter (Signed)
Received a call from [facility] stating Tracey Boyd is non-formulary medication. Insurance plan is requesting pt first try on of the covered alteratives: Advair and Breo  Please advise, thanks!

## 2016-03-08 NOTE — Telephone Encounter (Signed)
Changed to Breo Please let her know I have samples if she wants to try them

## 2016-03-11 DIAGNOSIS — Z0000 Encounter for general adult medical examination without abnormal findings: Secondary | ICD-10-CM | POA: Insufficient documentation

## 2016-03-11 NOTE — Assessment & Plan Note (Signed)

## 2016-04-28 ENCOUNTER — Other Ambulatory Visit: Payer: Self-pay | Admitting: Internal Medicine

## 2016-05-27 ENCOUNTER — Encounter: Payer: Self-pay | Admitting: Family Medicine

## 2016-05-27 ENCOUNTER — Ambulatory Visit (INDEPENDENT_AMBULATORY_CARE_PROVIDER_SITE_OTHER): Payer: Medicare HMO | Admitting: Family Medicine

## 2016-05-27 VITALS — BP 118/80 | HR 94 | Resp 12 | Ht 64.0 in

## 2016-05-27 DIAGNOSIS — J4521 Mild intermittent asthma with (acute) exacerbation: Secondary | ICD-10-CM | POA: Diagnosis not present

## 2016-05-27 MED ORDER — ALBUTEROL SULFATE HFA 108 (90 BASE) MCG/ACT IN AERS: 2.0000 | INHALATION_SPRAY | Freq: Four times a day (QID) | RESPIRATORY_TRACT | 1 refills | Status: DC | PRN

## 2016-05-27 MED ORDER — PREDNISONE 20 MG PO TABS: 20.0000 mg | ORAL_TABLET | Freq: Every day | ORAL | 0 refills | Status: AC

## 2016-05-27 MED ORDER — IPRATROPIUM-ALBUTEROL 0.5-2.5 (3) MG/3ML IN SOLN
3.0000 mL | Freq: Once | RESPIRATORY_TRACT | Status: AC
  Administered 2016-05-27: 3 mL via RESPIRATORY_TRACT

## 2016-05-27 NOTE — Progress Notes (Signed)
Pre visit review using our clinic review tool, if applicable. No additional management support is needed unless otherwise documented below in the visit note. 

## 2016-05-27 NOTE — Patient Instructions (Addendum)
  [name] I have seen you today for an acute visit because your primary care provider was not available.    1. Asthma with exacerbation, mild intermittent   - albuterol (PROAIR HFA) 108 (90 Base) MCG/ACT inhaler; Inhale 2 puffs into the lungs every 6 (six) hours as needed.  Dispense: 18 g; Refill: 1 - ipratropium-albuterol (DUONEB) 0.5-2.5 (3) MG/3ML nebulizer solution 3 mL; Take 3 mLs by nebulization once. - predniSONE (DELTASONE) 20 MG tablet; Take 1 tablet (20 mg total) by mouth daily with breakfast.  Dispense: 5 tablet; Refill: 0  Start prednisone tomorrow, take medication with breakfast. Albuterol Every 6 hours for a week and then as needed. Over-the-counter Zyrtec 10 mg daily. Follow-up with your doctor in 1-2 weeks, before if needed.     Monitor for signs of worsening symptoms and seek immediate medical attention if any concerning/warning symptom as we discussed.

## 2016-05-27 NOTE — Progress Notes (Signed)
HPI:  ACUTE VISIT:  Chief Complaint  Patient presents with  . Allergies    walked 2 miles outside & raked grass. Coughing     [name] is a 65 y.o. female, who is here today complaining of 1-2 days of respiratory symptoms.  + Wheezing, productive cough with yellowish/clear sputum, and exertional dyspnea with moderate to intense physical activity.  Symptoms started at day after she was raking leaves.  + Nasal congestion, rhinorrhea,epiphora, and post nasal drainage. Denies chills, fever, body aches.   No Hx of recent travel. No sick contact: No known insect bite.  + Hx of allergies: asthma and allergic rhinitis. Currently she is on Dulera ? Mcg once daily, she is using samples given to her husband.  She has not needed Albuterol in a while and ran out.  Medication OTC for this problem: None.  Symptoms otherwise stable.  She is taking care of her 40 month old grandchild and wants to be sure it is not contagious.   Review of Systems  Constitutional: Positive for fatigue. Negative for appetite change, chills, fever and unexpected weight change.  HENT: Positive for congestion, postnasal drip, rhinorrhea and sneezing. Negative for ear pain, facial swelling, mouth sores, nosebleeds, sinus pressure, sore throat, trouble swallowing and voice change.   Eyes: Positive for discharge and itching. Negative for pain, redness and visual disturbance.  Respiratory: Positive for cough, shortness of breath and wheezing.   Cardiovascular: Negative for chest pain, palpitations and leg swelling.  Gastrointestinal: Negative for abdominal pain, diarrhea, nausea and vomiting.  Musculoskeletal: Negative for back pain, myalgias and neck pain.  Skin: Negative for color change and rash.  Allergic/Immunologic: Positive for environmental allergies.  Neurological: Negative for syncope, weakness, numbness and headaches.  Hematological: Negative for adenopathy. Does not bruise/bleed easily.       Current Outpatient Prescriptions on File Prior to Visit  Medication Sig Dispense Refill  . amLODipine (NORVASC) 2.5 MG tablet Take 1 tablet (2.5 mg total) by mouth daily. 90 tablet 3  . aspirin 81 MG tablet Take 81 mg by mouth daily.      . Cholecalciferol (VITAMIN D3) 2000 UNITS TABS Take by mouth. Take 1 daily.    . fluticasone furoate-vilanterol (BREO ELLIPTA) 200-25 MCG/INH AEPB Inhale 1 puff into the lungs daily. 30 each 11  . omeprazole (PRILOSEC) 40 MG capsule TAKE ONE CAPSULE BY MOUTH DAILY 90 capsule 1  . Respiratory Therapy Supplies (FLUTTER) DEVI Use as directed 1 each 0   No current facility-administered medications on file prior to visit.      Past Medical History:  Diagnosis Date  . Anxiety   . Asthma   . Dermatitis herpetiformis   . GERD (gastroesophageal reflux disease)   . HTN (hypertension)    Allergies  Allergen Reactions  . Ace Inhibitors     REACTION: cough (lisinopril)  . Penicillins     REACTION: hives  . Sulfonamide Derivatives     REACTION: hives    Social History   Social History  . Marital status: Married    Spouse name: N/A  . Number of children: N/A  . Years of education: N/A   Occupational History  . raises money for cancer research Self-Employed   Social History Main Topics  . Smoking status: Never Smoker  . Smokeless tobacco: Never Used     Comment: pt states both parents were smokers  . Alcohol use No  . Drug use: No  . Sexual activity: Not Asked  Other Topics Concern  . None   Social History Narrative  . None    Vitals:   05/27/16 1449  BP: 118/80  Pulse: 94  Resp: 12   There is no height or weight on file to calculate BMI.    Physical Exam  Nursing note and vitals reviewed. Constitutional: She is oriented to person, place, and time. She appears well-developed. She does not appear ill. No distress.  HENT:  Head: Atraumatic.  Right Ear: Tympanic membrane, external ear and ear canal normal.  Left Ear:  Tympanic membrane, external ear and ear canal normal.  Nose: Rhinorrhea present. Right sinus exhibits no maxillary sinus tenderness and no frontal sinus tenderness. Left sinus exhibits no maxillary sinus tenderness and no frontal sinus tenderness.  Mouth/Throat: Oropharynx is clear and moist and mucous membranes are normal.  Post nasal drainage. Nasal voice.  Eyes: Conjunctivae are normal.  Bilateral epiphora.   Neck: No muscular tenderness present. No edema and no erythema present.  Cardiovascular: Normal rate and regular rhythm.   No murmur heard. Respiratory: Effort normal. No stridor. No respiratory distress. She has wheezes (bilateral).  Lymphadenopathy:       Head (right side): No submandibular adenopathy present.       Head (left side): No submandibular adenopathy present.    She has no cervical adenopathy.  Neurological: She is alert and oriented to person, place, and time. She has normal strength.  Skin: Skin is warm. No rash noted. No erythema.  Psychiatric: She has a normal mood and affect. Her speech is normal.  Well groomed, good eye contact.      ASSESSMENT AND PLAN:     [name] was seen today for allergies.  Diagnoses and all orders for this visit:  Asthma with exacerbation, mild intermittent -     albuterol (PROAIR HFA) 108 (90 Base) MCG/ACT inhaler; Inhale 2 puffs into the lungs every 6 (six) hours as needed. -     ipratropium-albuterol (DUONEB) 0.5-2.5 (3) MG/3ML nebulizer solution 3 mL; Take 3 mLs by nebulization once. -     predniSONE (DELTASONE) 20 MG tablet; Take 1 tablet (20 mg total) by mouth daily with breakfast.  Auscultation after DuoNeb treatment negative, wheezing has resolved; no rales or rhonchi. No changes in Dulera dose, except for increasing frequency from qd to bid. Explained that history suggest allergic etiology for asthma exacerbation but could also be due to a mild viral illness, recommend taking precautions around grandchild in case.    Monitor for fever. She states that she has taken Prednisone a couple times and she has not had any reaction, recommend Prednisone 20 mg daily for 5 days. Albuterol 2 puffs every 6 hours for a week and then as needed. OTC antihistaminic, Zyrtec 10 mg daily. Instructed about warning signs. Follow-up in 1-2 weeks with PCP.       Return in about 2 weeks (around 06/10/2016) for 1-2 weeks with PCP for asthma.     -[name] was advised to return or notify a doctor immediately if symptoms worsen or new concerns arise.       Betty G. Martinique, MD  [facility]. [place] office.

## 2016-06-25 DIAGNOSIS — M8588 Other specified disorders of bone density and structure, other site: Secondary | ICD-10-CM | POA: Diagnosis not present

## 2016-06-25 DIAGNOSIS — N958 Other specified menopausal and perimenopausal disorders: Secondary | ICD-10-CM | POA: Diagnosis not present

## 2016-07-02 DIAGNOSIS — Z6827 Body mass index (BMI) 27.0-27.9, adult: Secondary | ICD-10-CM | POA: Diagnosis not present

## 2016-07-02 DIAGNOSIS — Z01419 Encounter for gynecological examination (general) (routine) without abnormal findings: Secondary | ICD-10-CM | POA: Diagnosis not present

## 2016-07-02 DIAGNOSIS — Z124 Encounter for screening for malignant neoplasm of cervix: Secondary | ICD-10-CM | POA: Diagnosis not present

## 2016-07-11 DIAGNOSIS — R69 Illness, unspecified: Secondary | ICD-10-CM | POA: Diagnosis not present

## 2016-08-27 DIAGNOSIS — Z1231 Encounter for screening mammogram for malignant neoplasm of breast: Secondary | ICD-10-CM | POA: Diagnosis not present

## 2016-09-04 ENCOUNTER — Telehealth: Payer: Self-pay | Admitting: Internal Medicine

## 2016-09-04 NOTE — Telephone Encounter (Signed)
Pt c/o laryngitis, prod cough, chills, intermittent fever X1.5 weeks. Pt has not been seen since 01/2015.  Advised she would need to be seen for further recs.  Scheduled pt for tomorrow at 4:00 with MW.  Nothing further needed.

## 2016-09-05 ENCOUNTER — Ambulatory Visit (INDEPENDENT_AMBULATORY_CARE_PROVIDER_SITE_OTHER): Payer: Medicare HMO | Admitting: Internal Medicine

## 2016-09-05 ENCOUNTER — Other Ambulatory Visit: Payer: Self-pay | Admitting: Internal Medicine

## 2016-09-05 ENCOUNTER — Encounter: Payer: Self-pay | Admitting: Internal Medicine

## 2016-09-05 VITALS — BP 118/70 | HR 96 | Temp 98.4°F | Ht 64.0 in | Wt 149.2 lb

## 2016-09-05 DIAGNOSIS — J4540 Moderate persistent asthma, uncomplicated: Secondary | ICD-10-CM | POA: Diagnosis not present

## 2016-09-05 DIAGNOSIS — R058 Other specified cough: Secondary | ICD-10-CM

## 2016-09-05 DIAGNOSIS — R05 Cough: Secondary | ICD-10-CM

## 2016-09-05 MED ORDER — AZITHROMYCIN 250 MG PO TABS: ORAL_TABLET | ORAL | 0 refills | Status: DC

## 2016-09-05 MED ORDER — FAMOTIDINE 20 MG PO TABS: ORAL_TABLET | ORAL | 2 refills | Status: DC

## 2016-09-05 MED ORDER — METHYLPREDNISOLONE 4 MG PO TABS: ORAL_TABLET | ORAL | 0 refills | Status: DC

## 2016-09-05 MED ORDER — TRAMADOL HCL 50 MG PO TABS: ORAL_TABLET | ORAL | 0 refills | Status: DC

## 2016-09-05 NOTE — Patient Instructions (Addendum)
Whenever cough for whatever reason : prilosec 40 mg Take 30-60 min    before your first and last meals of the day and  Add Pepcid 20 mg one at bedtime    GERD (REFLUX)  is an extremely common cause of respiratory symptoms just like yours , many times with no obvious heartburn at all.    It can be treated with medication, but also with lifestyle changes including elevation of the head of your bed (ideally with 6 inch  bed blocks),  Smoking cessation, avoidance of late meals, excessive alcohol, and avoid fatty foods, chocolate, peppermint, colas, red wine, and acidic juices such as orange juice.  NO MINT OR MENTHOL PRODUCTS SO NO COUGH DROPS  USE SUGARLESS CANDY INSTEAD (Jolley ranchers or Stover's or Life Savers) or even ice chips will also do - the key is to swallow to prevent all throat clearing. NO OIL BASED VITAMINS - use powdered substitutes.   For cough >>   mucinex dm 1200  Mg every 12 hours and add tramadol if can't stop coughing and use the flutter valve as much as possible   Medrol 4 mg  4 x 2,  2 x 2, 1 x 2   zpak   If not better return in 2 weeks for chest xray

## 2016-09-05 NOTE — Progress Notes (Addendum)
Subjective:    Patient ID: Tracey Boyd, female    DOB: May 18, 1951,     MRN: [id number]   65 yowm never smoker with onset of ? EIA in early 20's but never on maint rx until qvar / symbicort started around 2012 by  [facility]  01/23/2015 acute  ov/Lenox Bink re: cough on symbicort 80 one twice daily maint/ did not have saba Chief Complaint  Patient presents with  . Acute Visit    Pt c/o prod cough with yellow sputum x 01/19/15. Has taken zithromax and this did not help.   acute onset  Around 01/17/15 and progressively downhill since with severe cough / gagging but no vomiting  Zpak/ medrol completed prilosec one hour before bfast as maint s overt hb on this  Not really sob unless coughing rec Whenever cough for whatever reason :  prilosec 40 mg Take 30-60 min    before your first and last meals of the day and Pepcid 20 mg one at bedtime  GERD diet  mucinex dm 1200  Mg every 12 hours and add tramadol if can't stop coughing and use the flutter valve  Repeat medrol dose pack  Call by 01/28/15 if not improving  Late add declined cxr agreed to return for one if not improving and prilosec should be Take 30- 60 min before your first and last meals of the day    09/05/2016 acute extended ov/Yael Angerer re:  dulera 100 one daily / prilosec once daily   Chief Complaint  Patient presents with  . Acute Visit    hoarseness for 7+ days, congestion, feels very cold  reports all better p prev ov 01/2015 and reduced meds to dulera 100 one daily/ prilosec one daily and did fine on this until  acute onset 08/24/16  Moderate Hoarseness s sore throat but fever x 24 h then congested rattling cough not productive though "sounds like it should be"> did not restart recs as per previous instructions    No obvious day to day or daytime variabilty or assoc excess/ purulent sputum or mucus plugs    cp or chest tightness, subjective wheeze overt sinus or hb symptoms. No unusual exp hx or h/o childhood pna/ asthma or knowledge of  premature birth.  Sleeping ok without nocturnal  or early am exacerbation  of respiratory  c/o's or need for noct saba. Also denies any obvious fluctuation of symptoms with weather or environmental changes or other aggravating or alleviating factors except as outlined above   Current Medications, Allergies, Complete Past Medical History, Past Surgical History, Family History, and Social History were reviewed in [employer] record.  ROS  The following are not active complaints unless bolded sore throat, dysphagia, dental problems, itching, sneezing,  nasal congestion or excess/ purulent secretions, ear ache,   fever, chills, sweats, unintended wt loss, pleuritic or exertional cp, hemoptysis,  orthopnea pnd or leg swelling, presyncope, palpitations, heartburn, abdominal pain, anorexia, nausea, vomiting, diarrhea  or change in bowel or urinary habits, change in stools or urine, dysuria,hematuria,  rash, arthralgias, visual complaints, headache, numbness weakness or ataxia or problems with walking or coordination,  change in mood/affect or memory.          Objective:   Physical Exam     amb wf mod hoarse with  harsh barking quality upper airway cough (not rattling at all)    09/05/2016      01/23/15 156 lb 3.2 oz (70.852 kg)  08/25/14 160 lb (72.576 kg)  07/22/13 156 lb 3.2 oz (70.852 kg)    Vital signs reviewed - Note on arrival 02 sats  97% on RA    HEENT: nl dentition, turbinates, and orophanx. Nl external ear canals without cough reflex   NECK :  without JVD/Nodes/TM/ nl carotid upstrokes bilaterally   LUNGS: no acc muscle use, mostly pseudowheezing    CV:  RRR  no s3 or murmur or increase in P2, no edema   ABD:  soft and nontender with nl excursion in the supine position. No bruits or organomegaly, bowel sounds nl  MS:  warm without deformities, calf tenderness, cyanosis or clubbing  SKIN: warm and dry without lesions    NEURO:  alert, approp, no  deficits       cxr rec 09/05/2016 > declined        Assessment & Plan:

## 2016-09-06 ENCOUNTER — Encounter: Payer: Self-pay | Admitting: Internal Medicine

## 2016-09-06 NOTE — Assessment & Plan Note (Addendum)
PFts 02/25/2011 (first ever)  -fev1 1.31L/55%, 40% post Bd response, TLC 75% and slightly low, DLCO 16/78% and slightly low 03/18/11 spirometry on QVAR FEV1 82%   - The proper method of use, as well as anticipated side effects, of a metered-dose inhaler are discussed and demonstrated to the patient. Improved effectiveness after extensive coaching during this visit to a level of approximately 90 % from a baseline of 75  % > continue hfa/ no need to change rx at this point and the lower dose of dulera irritates the uacs less - note despite a typical trigger (viral uri) no evidence of an asthma flare at present

## 2016-09-06 NOTE — Assessment & Plan Note (Signed)
-   added flutter valve 01/23/15 > not using as of 09/05/2016   Upper airway cough syndrome (previously labeled PNDS) , is  so named because it's frequently impossible to sort out how much is  CR/sinusitis with freq throat clearing (which can be related to primary GERD)   vs  causing  secondary (" extra esophageal")  GERD from wide swings in gastric pressure that occur with throat clearing, often  promoting self use of mint and menthol lozenges that reduce the lower esophageal sphincter tone and exacerbate the problem further in a cyclical fashion.   These are the same pts (now being labeled as having "irritable larynx syndrome" by some cough centers) who not infrequently have a history of having failed to tolerate ace inhibitors,  dry powder inhalers or biphosphonates or report having atypical/extraesophageal reflux symptoms that don't respond to standard doses of PPI  and are easily confused as having aecopd or asthma flares by even experienced allergists/ pulmonologists (myself included).  Of the three most common causes of chronic cough, only one (GERD)  can actually cause the other two (asthma and post nasal drip syndrome)  and perpetuate the cylce of cough inducing airway trauma, inflammation, heightened sensitivity to reflux which is prompted by the cough itself via a cyclical mechanism.    This may partially respond to steroids and look like asthma and post nasal drainage but never erradicated completely unless the cough and the secondary reflux are eliminated, preferably both at the same time.  While not intuitively obvious, many patients with chronic low grade reflux do not cough until there is a secondary insult that disturbs the protective epithelial barrier and exposes sensitive nerve endings.  This can be viral (as clearly was the case here as only had fever x first 24 h then none since) or direct physical injury such as with an endotracheal tube.   The point is that once this occurs, it is  difficult to eliminate using anything but a maximally effective acid suppression regimen at least in the short run, accompanied by an appropriate diet to address non acid GERD.   rec restart max gerd rx/ cyclical cough prevention/ no change in asthma rx as this is not an asthma flare   I had an extended discussion with the patient reviewing all relevant studies completed to date and  lasting 15 to 20 minutes of a 25 minute acute office visit    Each maintenance medication was reviewed in detail including most importantly the difference between maintenance and prns and under what circumstances the prns are to be triggered using an action plan format that is not reflected in the computer generated alphabetically organized AVS.    Please see AVS for unique instructions that I personally wrote and verbalized to the the pt in detail and then reviewed with pt  by my nurse highlighting any  changes in therapy recommended at today's visit to their plan of care.

## 2016-09-12 ENCOUNTER — Telehealth: Payer: Self-pay

## 2016-09-12 ENCOUNTER — Other Ambulatory Visit: Payer: Self-pay | Admitting: Internal Medicine

## 2016-09-12 DIAGNOSIS — I10 Essential (primary) hypertension: Secondary | ICD-10-CM

## 2016-09-12 DIAGNOSIS — K219 Gastro-esophageal reflux disease without esophagitis: Secondary | ICD-10-CM

## 2016-09-12 MED ORDER — AMLODIPINE BESYLATE 2.5 MG PO TABS: 2.5000 mg | ORAL_TABLET | Freq: Every day | ORAL | 1 refills | Status: DC

## 2016-09-12 MED ORDER — OMEPRAZOLE 40 MG PO CPDR: 40.0000 mg | DELAYED_RELEASE_CAPSULE | Freq: Every day | ORAL | 1 refills | Status: DC

## 2016-09-12 NOTE — Telephone Encounter (Signed)
Holland Falling has sent a rq to rf amlodipine and omeprazole.   Please advise. (Pharmacy has been added).

## 2016-09-12 NOTE — Telephone Encounter (Signed)
done

## 2016-09-24 DIAGNOSIS — R69 Illness, unspecified: Secondary | ICD-10-CM | POA: Diagnosis not present

## 2016-11-06 ENCOUNTER — Ambulatory Visit (INDEPENDENT_AMBULATORY_CARE_PROVIDER_SITE_OTHER): Payer: Medicare HMO

## 2016-11-06 ENCOUNTER — Ambulatory Visit (HOSPITAL_COMMUNITY)
Admission: EM | Admit: 2016-11-06 | Discharge: 2016-11-06 | Disposition: A | Discharge status: Home / Self Care | Payer: Medicare HMO | Attending: Family Medicine | Admitting: Family Medicine

## 2016-11-06 DIAGNOSIS — S52592A Other fractures of lower end of left radius, initial encounter for closed fracture: Secondary | ICD-10-CM | POA: Diagnosis not present

## 2016-11-06 DIAGNOSIS — S52602A Unspecified fracture of lower end of left ulna, initial encounter for closed fracture: Secondary | ICD-10-CM

## 2016-11-06 DIAGNOSIS — S52502A Unspecified fracture of the lower end of left radius, initial encounter for closed fracture: Secondary | ICD-10-CM

## 2016-11-06 MED ORDER — HYDROCODONE-ACETAMINOPHEN 5-325 MG PO TABS: 1.0000 | ORAL_TABLET | ORAL | 0 refills | Status: DC | PRN

## 2016-11-06 NOTE — Discharge Instructions (Signed)
Go to see dr Amedeo Plenty on thurs at 9am, keep ice and elevation of arm,

## 2016-11-06 NOTE — ED Provider Notes (Signed)
[place]    CSN: [id number] Arrival date & time: 11/06/16  1419     History   Chief Complaint Chief Complaint  Patient presents with  . Wrist Injury  . Leg Pain    HPI Tracey Boyd is a 66 y.o. female.   The history is provided by the patient.  Wrist Injury  Location:  Wrist Wrist location:  L wrist Injury: yes   Time since incident:  1 hour Mechanism of injury: fall   Fall:    Fall occurred: fell over curb at [employer] with injury to right elbow, left knee and wrist. Leg Pain    Past Medical History:  Diagnosis Date  . Anxiety   . Asthma   . Dermatitis herpetiformis   . GERD (gastroesophageal reflux disease)   . HTN (hypertension)     Patient Active Problem List   Diagnosis Date Noted  . Routine general medical examination at a health care facility 03/11/2016  . Need for hepatitis C screening test 03/07/2016  . Upper airway cough syndrome 01/24/2015  . Hyperlipidemia with target LDL less than 130 08/25/2014  . Asthma, moderate persistent, well-controlled 02/25/2011  . Essential hypertension 11/04/2007  . GERD 11/04/2007    Past Surgical History:  Procedure Laterality Date  . APPENDECTOMY    . CYSTECTOMY      OB History    No data available       Home Medications    Prior to Admission medications   Medication Sig Start Date End Date Taking? Authorizing Provider  albuterol (PROAIR HFA) 108 (90 Base) MCG/ACT inhaler Inhale 2 puffs into the lungs every 6 (six) hours as needed. 05/27/16  Yes Betty G Martinique, MD  amLODipine (NORVASC) 2.5 MG tablet Take 1 tablet (2.5 mg total) by mouth daily. 09/12/16  Yes Janith Lima, MD  aspirin 81 MG tablet Take 81 mg by mouth daily.     Yes Historical Provider, MD  Cholecalciferol (VITAMIN D3) 2000 UNITS TABS Take by mouth. Take 1 daily.   Yes Historical Provider, MD  levothyroxine (SYNTHROID, LEVOTHROID) 50 MCG tablet Take 50 mcg by mouth daily before breakfast.   Yes Historical Provider, MD    loratadine (CLARITIN) 10 MG tablet Take 10 mg by mouth daily.   Yes Historical Provider, MD  mometasone-formoterol (DULERA) 100-5 MCG/ACT AERO Inhale 2 puffs into the lungs 2 (two) times daily.   Yes Historical Provider, MD  omeprazole (PRILOSEC) 40 MG capsule Take 1 capsule (40 mg total) by mouth daily. 09/12/16  Yes Janith Lima, MD  Respiratory Therapy Supplies (FLUTTER) DEVI Use as directed 01/23/15  Yes Tanda Rockers, MD  famotidine (PEPCID) 20 MG tablet One at bedtime 09/05/16   Tanda Rockers, MD  HYDROcodone-acetaminophen (NORCO/VICODIN) 5-325 MG tablet Take 1 tablet by mouth every 4 (four) hours as needed. 11/06/16   Billy Fischer, MD  traMADol Veatrice Bourbon) 50 MG tablet 1-2 every 4 hours as needed for cough or pain 09/05/16   Tanda Rockers, MD    Family History Family History  Problem Relation Age of Onset  . Multiple sclerosis Mother   . Lung cancer Mother   . Colon polyps Mother   . Alcohol abuse Father   . Multiple sclerosis Sister   . Colon cancer Neg Hx     Social History Social History  Substance Use Topics  . Smoking status: Never Smoker  . Smokeless tobacco: Never Used     Comment: pt states both parents were  smokers  . Alcohol use No     Allergies   Ace inhibitors; Penicillins; and Sulfonamide derivatives   Review of Systems Review of Systems  Constitutional: Negative.   Musculoskeletal: Positive for joint swelling.  Skin: Positive for wound.  All other systems reviewed and are negative.    Physical Exam Triage Vital Signs ED Triage Vitals [11/06/16 1447]  Enc Vitals Group     BP 131/92     Pulse Rate 92     Resp 16     Temp 97.6 F (36.4 C)     Temp Source Oral     SpO2 100 %     Weight      Height      Head Circumference      Peak Flow      Pain Score      Pain Loc      Pain Edu?      Excl. in [place]?    No data found.   Updated Vital Signs BP 131/92 (BP Location: Right Arm)   Pulse 92   Temp 97.6 F (36.4 C) (Oral)   Resp 16    SpO2 100%   Visual Acuity Right Eye Distance:   Left Eye Distance:   Bilateral Distance:    Right Eye Near:   Left Eye Near:    Bilateral Near:     Physical Exam  Constitutional: She is oriented to person, place, and time. She appears well-developed and well-nourished.  Musculoskeletal: She exhibits tenderness and deformity.       Left wrist: She exhibits decreased range of motion, tenderness, bony tenderness, swelling and deformity.       Arms: Neurological: She is alert and oriented to person, place, and time.  Skin: Skin is warm. Rash noted. There is erythema.  Nursing note and vitals reviewed.    UC Treatments / Results  Labs (all labs ordered are listed, but only abnormal results are displayed) Labs Reviewed - No data to display  EKG  EKG Interpretation None       Radiology Dg Wrist Complete Left  Result Date: 11/06/2016 CLINICAL DATA:  Trip and fall with left wrist pain and deformity, initial encounter EXAM: LEFT WRIST - COMPLETE 3+ VIEW COMPARISON:  None. FINDINGS: Transverse fracture through the distal radius is noted with impaction and anterior angulation at the fracture site. The radial fracture extends into the radiocarpal joint posteriorly. Ulnar styloid fracture is noted as well. No other fractures are seen. IMPRESSION: Distal radial and ulnar fractures as described Electronically Signed   By: Inez Catalina M.D.   On: 11/06/2016 15:40    Procedures Procedures (including critical care time)  Medications Ordered in UC Medications - No data to display   Initial Impression / Assessment and Plan / UC Course  I have reviewed the triage vital signs and the nursing notes.  Pertinent labs & imaging results that were available during my care of the patient were reviewed by me and considered in my medical decision making (see chart for details).     Discussed with dr Amedeo Plenty, plans as noted  Final Clinical Impressions(s) / UC Diagnoses   Final diagnoses:   Closed fracture distal radius and ulna, left, initial encounter    New Prescriptions New Prescriptions   HYDROCODONE-ACETAMINOPHEN (NORCO/VICODIN) 5-325 MG TABLET    Take 1 tablet by mouth every 4 (four) hours as needed.     Billy Fischer, MD 11/06/16 [id number]

## 2016-11-06 NOTE — ED Triage Notes (Signed)
C/o left wrist and leg pain States she fell off the curb and hit the left side of her body

## 2016-11-06 NOTE — ED Notes (Signed)
Ortho tech at bedside 

## 2016-11-06 NOTE — Progress Notes (Signed)
Orthopedic Tech Progress Note Patient Details:  Tracey Boyd 1951/06/23 [id number]  Ortho Devices Type of Ortho Device: Ace wrap, Volar splint Ortho Device/Splint Location: lue Ortho Device/Splint Interventions: Application   [name] 11/06/2016, 4:52 PM As ordered by Dr. Delilah Shan

## 2016-11-07 ENCOUNTER — Ambulatory Visit: Payer: Self-pay | Admitting: Orthopedic Surgery

## 2016-11-07 ENCOUNTER — Encounter (HOSPITAL_COMMUNITY): Payer: Self-pay | Admitting: *Deleted

## 2016-11-07 DIAGNOSIS — M25532 Pain in left wrist: Secondary | ICD-10-CM | POA: Diagnosis not present

## 2016-11-07 DIAGNOSIS — S52572A Other intraarticular fracture of lower end of left radius, initial encounter for closed fracture: Secondary | ICD-10-CM | POA: Diagnosis not present

## 2016-11-07 NOTE — Progress Notes (Signed)
Spoke with pt for pre-op call. Pt denies cardiac history, chest pain or sob. Pt states she is not a diabetic. 

## 2016-11-08 ENCOUNTER — Ambulatory Visit (HOSPITAL_COMMUNITY): Payer: Medicare HMO | Admitting: Anesthesiology

## 2016-11-08 ENCOUNTER — Observation Stay (HOSPITAL_COMMUNITY)
Admission: RE | Admit: 2016-11-08 | Discharge: 2016-11-09 | Disposition: A | Discharge status: Home / Self Care | Payer: Medicare HMO | Source: Ambulatory Visit | Attending: Orthopedic Surgery | Admitting: Orthopedic Surgery

## 2016-11-08 ENCOUNTER — Encounter (HOSPITAL_COMMUNITY)
Admission: RE | Disposition: A | Discharge status: Home / Self Care | Payer: Self-pay | Source: Ambulatory Visit | Attending: Orthopedic Surgery

## 2016-11-08 ENCOUNTER — Encounter (HOSPITAL_COMMUNITY): Payer: Self-pay | Admitting: *Deleted

## 2016-11-08 DIAGNOSIS — Z9049 Acquired absence of other specified parts of digestive tract: Secondary | ICD-10-CM | POA: Insufficient documentation

## 2016-11-08 DIAGNOSIS — Z9889 Other specified postprocedural states: Secondary | ICD-10-CM | POA: Diagnosis not present

## 2016-11-08 DIAGNOSIS — Z820 Family history of epilepsy and other diseases of the nervous system: Secondary | ICD-10-CM | POA: Insufficient documentation

## 2016-11-08 DIAGNOSIS — Z888 Allergy status to other drugs, medicaments and biological substances status: Secondary | ICD-10-CM | POA: Diagnosis not present

## 2016-11-08 DIAGNOSIS — J45909 Unspecified asthma, uncomplicated: Secondary | ICD-10-CM | POA: Insufficient documentation

## 2016-11-08 DIAGNOSIS — S52572A Other intraarticular fracture of lower end of left radius, initial encounter for closed fracture: Principal | ICD-10-CM | POA: Insufficient documentation

## 2016-11-08 DIAGNOSIS — S52612A Displaced fracture of left ulna styloid process, initial encounter for closed fracture: Secondary | ICD-10-CM | POA: Diagnosis not present

## 2016-11-08 DIAGNOSIS — Z7982 Long term (current) use of aspirin: Secondary | ICD-10-CM | POA: Diagnosis not present

## 2016-11-08 DIAGNOSIS — Z7951 Long term (current) use of inhaled steroids: Secondary | ICD-10-CM | POA: Insufficient documentation

## 2016-11-08 DIAGNOSIS — W010XXA Fall on same level from slipping, tripping and stumbling without subsequent striking against object, initial encounter: Secondary | ICD-10-CM | POA: Diagnosis not present

## 2016-11-08 DIAGNOSIS — Z880 Allergy status to penicillin: Secondary | ICD-10-CM | POA: Insufficient documentation

## 2016-11-08 DIAGNOSIS — Z79899 Other long term (current) drug therapy: Secondary | ICD-10-CM | POA: Insufficient documentation

## 2016-11-08 DIAGNOSIS — I10 Essential (primary) hypertension: Secondary | ICD-10-CM | POA: Diagnosis not present

## 2016-11-08 DIAGNOSIS — Z882 Allergy status to sulfonamides status: Secondary | ICD-10-CM | POA: Diagnosis not present

## 2016-11-08 DIAGNOSIS — E039 Hypothyroidism, unspecified: Secondary | ICD-10-CM | POA: Diagnosis not present

## 2016-11-08 DIAGNOSIS — S52502A Unspecified fracture of the lower end of left radius, initial encounter for closed fracture: Secondary | ICD-10-CM | POA: Diagnosis present

## 2016-11-08 DIAGNOSIS — Z811 Family history of alcohol abuse and dependence: Secondary | ICD-10-CM | POA: Diagnosis not present

## 2016-11-08 DIAGNOSIS — K219 Gastro-esophageal reflux disease without esophagitis: Secondary | ICD-10-CM | POA: Insufficient documentation

## 2016-11-08 DIAGNOSIS — Z8371 Family history of colonic polyps: Secondary | ICD-10-CM | POA: Diagnosis not present

## 2016-11-08 DIAGNOSIS — R69 Illness, unspecified: Secondary | ICD-10-CM | POA: Diagnosis not present

## 2016-11-08 DIAGNOSIS — Z801 Family history of malignant neoplasm of trachea, bronchus and lung: Secondary | ICD-10-CM | POA: Diagnosis not present

## 2016-11-08 DIAGNOSIS — S62102A Fracture of unspecified carpal bone, left wrist, initial encounter for closed fracture: Secondary | ICD-10-CM | POA: Diagnosis not present

## 2016-11-08 DIAGNOSIS — F419 Anxiety disorder, unspecified: Secondary | ICD-10-CM | POA: Diagnosis not present

## 2016-11-08 HISTORY — DX: Hypothyroidism, unspecified: E03.9

## 2016-11-08 HISTORY — PX: ORIF WRIST FRACTURE: SHX2133

## 2016-11-08 HISTORY — DX: Anemia, unspecified: D64.9

## 2016-11-08 LAB — BASIC METABOLIC PANEL
Anion gap: 14 (ref 5–15)
BUN: 20 mg/dL (ref 6–20)
CO2: 20 mmol/L — ABNORMAL LOW (ref 22–32)
Calcium: 9.3 mg/dL (ref 8.9–10.3)
Chloride: 105 mmol/L (ref 101–111)
Creatinine, Ser: 0.94 mg/dL (ref 0.44–1.00)
GFR calc Af Amer: >60 mL/min (ref >60)
GFR calc non Af Amer: >60 mL/min (ref >60)
GLUCOSE: 94 mg/dL (ref 65–99)
POTASSIUM: 4.9 mmol/L (ref 3.5–5.1)
Sodium: 139 mmol/L (ref 135–145)

## 2016-11-08 LAB — CBC
HCT: 41.8 % (ref 36.0–46.0)
HEMOGLOBIN: 14 g/dL (ref 12.0–15.0)
MCH: 29.9 pg (ref 26.0–34.0)
MCHC: 33.5 g/dL (ref 30.0–36.0)
MCV: 89.3 fL (ref 78.0–100.0)
PLATELETS: 217 10*3/uL (ref 150–400)
RBC: 4.68 MIL/uL (ref 3.87–5.11)
RDW: 14.2 % (ref 11.5–15.5)
WBC: 9.7 10*3/uL (ref 4.0–10.5)

## 2016-11-08 SURGERY — OPEN REDUCTION INTERNAL FIXATION (ORIF) WRIST FRACTURE
Anesthesia: Monitor Anesthesia Care | Laterality: Left

## 2016-11-08 MED ORDER — DOCUSATE SODIUM 100 MG PO CAPS
100.0000 mg | ORAL_CAPSULE | Freq: Two times a day (BID) | ORAL | Status: DC
  Administered 2016-11-09 (×2): 100 mg via ORAL
  Filled 2016-11-08 (×2): qty 1

## 2016-11-08 MED ORDER — MIDAZOLAM HCL 2 MG/2ML IJ SOLN
INTRAMUSCULAR | Status: AC
  Administered 2016-11-08: 1 mg via INTRAVENOUS
  Filled 2016-11-08: qty 2

## 2016-11-08 MED ORDER — MIDAZOLAM HCL 2 MG/2ML IJ SOLN
1.0000 mg | Freq: Once | INTRAMUSCULAR | Status: AC
  Administered 2016-11-08: 1 mg via INTRAVENOUS
  Filled 2016-11-08: qty 1

## 2016-11-08 MED ORDER — VITAMIN C 500 MG PO TABS
1000.0000 mg | ORAL_TABLET | Freq: Every day | ORAL | Status: DC
  Administered 2016-11-09: 1000 mg via ORAL
  Filled 2016-11-08: qty 2

## 2016-11-08 MED ORDER — CHLORHEXIDINE GLUCONATE 4 % EX LIQD: 60.0000 mL | Freq: Once | CUTANEOUS | Status: DC

## 2016-11-08 MED ORDER — TRAMADOL HCL 50 MG PO TABS
100.0000 mg | ORAL_TABLET | Freq: Four times a day (QID) | ORAL | Status: DC
  Administered 2016-11-09 (×2): 100 mg via ORAL
  Filled 2016-11-08 (×2): qty 2

## 2016-11-08 MED ORDER — AMLODIPINE BESYLATE 2.5 MG PO TABS
2.5000 mg | ORAL_TABLET | Freq: Every day | ORAL | Status: DC
  Administered 2016-11-09: 2.5 mg via ORAL
  Filled 2016-11-08: qty 1

## 2016-11-08 MED ORDER — ONDANSETRON HCL 4 MG/2ML IJ SOLN: 4.0000 mg | Freq: Four times a day (QID) | INTRAMUSCULAR | Status: DC | PRN

## 2016-11-08 MED ORDER — PROMETHAZINE HCL 25 MG/ML IJ SOLN: 6.2500 mg | INTRAMUSCULAR | Status: DC | PRN

## 2016-11-08 MED ORDER — MIDAZOLAM HCL 2 MG/2ML IJ SOLN
INTRAMUSCULAR | Status: AC
  Administered 2016-11-08: 20:00:00
  Filled 2016-11-08: qty 2

## 2016-11-08 MED ORDER — PANTOPRAZOLE SODIUM 40 MG PO TBEC
40.0000 mg | DELAYED_RELEASE_TABLET | Freq: Every day | ORAL | Status: DC
  Administered 2016-11-09: 40 mg via ORAL
  Filled 2016-11-08: qty 1

## 2016-11-08 MED ORDER — METHOCARBAMOL 500 MG PO TABS
500.0000 mg | ORAL_TABLET | Freq: Four times a day (QID) | ORAL | Status: DC | PRN
  Administered 2016-11-09: 500 mg via ORAL
  Filled 2016-11-08: qty 1

## 2016-11-08 MED ORDER — ROPIVACAINE HCL 7.5 MG/ML IJ SOLN
INTRAMUSCULAR | Status: DC | PRN
  Administered 2016-11-08: 30 mL via PERINEURAL

## 2016-11-08 MED ORDER — PROMETHAZINE HCL 25 MG RE SUPP: 12.5000 mg | Freq: Four times a day (QID) | RECTAL | Status: DC | PRN

## 2016-11-08 MED ORDER — 0.9 % SODIUM CHLORIDE (POUR BTL) OPTIME
TOPICAL | Status: DC | PRN
  Administered 2016-11-08: 1000 mL

## 2016-11-08 MED ORDER — VANCOMYCIN HCL 500 MG IV SOLR
500.0000 mg | Freq: Two times a day (BID) | INTRAVENOUS | Status: DC
  Administered 2016-11-09: 500 mg via INTRAVENOUS
  Filled 2016-11-08 (×2): qty 500

## 2016-11-08 MED ORDER — MIDAZOLAM HCL 2 MG/2ML IJ SOLN
INTRAMUSCULAR | Status: DC | PRN
  Administered 2016-11-08: 2 mg via INTRAVENOUS

## 2016-11-08 MED ORDER — HYDROMORPHONE HCL 1 MG/ML IJ SOLN: 0.2500 mg | INTRAMUSCULAR | Status: DC | PRN

## 2016-11-08 MED ORDER — FAMOTIDINE 20 MG PO TABS
20.0000 mg | ORAL_TABLET | Freq: Two times a day (BID) | ORAL | Status: DC | PRN
Start: 2016-11-08 — End: 2016-11-09

## 2016-11-08 MED ORDER — VANCOMYCIN HCL IN DEXTROSE 1-5 GM/200ML-% IV SOLN
1000.0000 mg | INTRAVENOUS | Status: AC
  Administered 2016-11-08: 1000 mg via INTRAVENOUS
  Filled 2016-11-08: qty 200

## 2016-11-08 MED ORDER — FENTANYL CITRATE (PF) 100 MCG/2ML IJ SOLN
INTRAMUSCULAR | Status: AC
  Filled 2016-11-08: qty 2

## 2016-11-08 MED ORDER — PROPOFOL 500 MG/50ML IV EMUL
INTRAVENOUS | Status: DC | PRN
  Administered 2016-11-08: 50 ug/kg/min via INTRAVENOUS

## 2016-11-08 MED ORDER — OXYCODONE HCL 5 MG PO TABS: 5.0000 mg | ORAL_TABLET | ORAL | Status: DC | PRN

## 2016-11-08 MED ORDER — OXYCODONE HCL 5 MG/5ML PO SOLN: 5.0000 mg | Freq: Once | ORAL | Status: DC | PRN

## 2016-11-08 MED ORDER — ADULT MULTIVITAMIN W/MINERALS CH
1.0000 | ORAL_TABLET | Freq: Every day | ORAL | Status: DC
  Administered 2016-11-09: 1 via ORAL
  Filled 2016-11-08: qty 1

## 2016-11-08 MED ORDER — LACTATED RINGERS IV SOLN
INTRAVENOUS | Status: DC
  Administered 2016-11-09: 01:00:00 via INTRAVENOUS

## 2016-11-08 MED ORDER — LACTATED RINGERS IV SOLN
INTRAVENOUS | Status: DC
  Administered 2016-11-08 (×2): via INTRAVENOUS

## 2016-11-08 MED ORDER — MOMETASONE FURO-FORMOTEROL FUM 100-5 MCG/ACT IN AERO
2.0000 | INHALATION_SPRAY | Freq: Every day | RESPIRATORY_TRACT | Status: DC
  Filled 2016-11-08: qty 8.8

## 2016-11-08 MED ORDER — OXYCODONE HCL 5 MG PO TABS: 5.0000 mg | ORAL_TABLET | Freq: Once | ORAL | Status: DC | PRN

## 2016-11-08 MED ORDER — ALBUTEROL SULFATE (2.5 MG/3ML) 0.083% IN NEBU: 3.0000 mL | INHALATION_SOLUTION | Freq: Four times a day (QID) | RESPIRATORY_TRACT | Status: DC | PRN

## 2016-11-08 MED ORDER — PROPOFOL 1000 MG/100ML IV EMUL
INTRAVENOUS | Status: AC
  Filled 2016-11-08: qty 100

## 2016-11-08 MED ORDER — LEVOTHYROXINE SODIUM 50 MCG PO TABS
50.0000 ug | ORAL_TABLET | Freq: Every day | ORAL | Status: DC
  Administered 2016-11-09: 50 ug via ORAL
  Filled 2016-11-08: qty 1

## 2016-11-08 MED ORDER — ALPRAZOLAM 0.5 MG PO TABS: 0.5000 mg | ORAL_TABLET | Freq: Four times a day (QID) | ORAL | Status: DC | PRN

## 2016-11-08 MED ORDER — METHOCARBAMOL 1000 MG/10ML IJ SOLN
500.0000 mg | Freq: Four times a day (QID) | INTRAVENOUS | Status: DC | PRN
  Filled 2016-11-08: qty 5

## 2016-11-08 MED ORDER — ONDANSETRON HCL 4 MG PO TABS: 4.0000 mg | ORAL_TABLET | Freq: Four times a day (QID) | ORAL | Status: DC | PRN

## 2016-11-08 MED ORDER — MEPERIDINE HCL 25 MG/ML IJ SOLN: 6.2500 mg | INTRAMUSCULAR | Status: DC | PRN

## 2016-11-08 MED ORDER — MORPHINE SULFATE (PF) 2 MG/ML IV SOLN: 1.0000 mg | INTRAVENOUS | Status: DC | PRN

## 2016-11-08 SURGICAL SUPPLY — 67 items
BANDAGE ACE 4X5 VEL STRL LF (GAUZE/BANDAGES/DRESSINGS) ×2 IMPLANT
BANDAGE ELASTIC 3 VELCRO ST LF (GAUZE/BANDAGES/DRESSINGS) ×1 IMPLANT
BANDAGE ELASTIC 4 VELCRO ST LF (GAUZE/BANDAGES/DRESSINGS) ×3 IMPLANT
BIT DRILL 2.2 SS TIBIAL (BIT) ×1 IMPLANT
BLADE CLIPPER SURG (BLADE) IMPLANT
BNDG CMPR 9X4 STRL LF SNTH (GAUZE/BANDAGES/DRESSINGS) ×1
BNDG ESMARK 4X9 LF (GAUZE/BANDAGES/DRESSINGS) ×2 IMPLANT
BNDG GAUZE ELAST 4 BULKY (GAUZE/BANDAGES/DRESSINGS) ×4 IMPLANT
CANISTER SUCT 3000ML PPV (MISCELLANEOUS) ×2 IMPLANT
CAST PADDING SYN 3 (CAST SUPPLIES) ×1 IMPLANT
CORDS BIPOLAR (ELECTRODE) ×2 IMPLANT
COVER SURGICAL LIGHT HANDLE (MISCELLANEOUS) ×2 IMPLANT
CUFF TOURNIQUET SINGLE 18IN (TOURNIQUET CUFF) ×2 IMPLANT
CUFF TOURNIQUET SINGLE 24IN (TOURNIQUET CUFF) IMPLANT
DRAIN TLS ROUND 10FR (DRAIN) IMPLANT
DRAPE OEC MINIVIEW 54X84 (DRAPES) IMPLANT
DRAPE SURG 17X23 STRL (DRAPES) ×2 IMPLANT
DRSG ADAPTIC 3X8 NADH LF (GAUZE/BANDAGES/DRESSINGS) ×1 IMPLANT
GAUZE SPONGE 4X4 12PLY STRL (GAUZE/BANDAGES/DRESSINGS) ×2 IMPLANT
GAUZE VASELINE 3X9 (GAUZE/BANDAGES/DRESSINGS) ×1 IMPLANT
GAUZE XEROFORM 1X8 LF (GAUZE/BANDAGES/DRESSINGS) ×1 IMPLANT
GLOVE BIOGEL M 8.0 STRL (GLOVE) ×2 IMPLANT
GLOVE SS BIOGEL STRL SZ 8 (GLOVE) ×1 IMPLANT
GLOVE SUPERSENSE BIOGEL SZ 8 (GLOVE) ×1
GOWN STRL REUS W/ TWL LRG LVL3 (GOWN DISPOSABLE) ×1 IMPLANT
GOWN STRL REUS W/ TWL XL LVL3 (GOWN DISPOSABLE) ×2 IMPLANT
GOWN STRL REUS W/TWL LRG LVL3 (GOWN DISPOSABLE) ×2
GOWN STRL REUS W/TWL XL LVL3 (GOWN DISPOSABLE) ×4
K-WIRE 1.6 (WIRE) ×2
K-WIRE FX5X1.6XNS BN SS (WIRE) ×1
KIT BASIN OR (CUSTOM PROCEDURE TRAY) ×2 IMPLANT
KIT ROOM TURNOVER OR (KITS) ×2 IMPLANT
KWIRE FX5X1.6XNS BN SS (WIRE) IMPLANT
LOOP VESSEL MAXI BLUE (MISCELLANEOUS) IMPLANT
MANIFOLD NEPTUNE II (INSTRUMENTS) ×1 IMPLANT
NEEDLE 22X1 1/2 (OR ONLY) (NEEDLE) IMPLANT
NS IRRIG 1000ML POUR BTL (IV SOLUTION) ×2 IMPLANT
PACK ORTHO EXTREMITY (CUSTOM PROCEDURE TRAY) ×2 IMPLANT
PAD ARMBOARD 7.5X6 YLW CONV (MISCELLANEOUS) ×4 IMPLANT
PAD CAST 3X4 CTTN HI CHSV (CAST SUPPLIES) ×1 IMPLANT
PAD CAST 4YDX4 CTTN HI CHSV (CAST SUPPLIES) ×1 IMPLANT
PADDING CAST COTTON 3X4 STRL (CAST SUPPLIES) ×2
PADDING CAST COTTON 4X4 STRL (CAST SUPPLIES) ×2
PEG LOCKING SMOOTH 2.2X16 (Screw) ×1 IMPLANT
PEG LOCKING SMOOTH 2.2X18 (Peg) ×3 IMPLANT
PEG LOCKING SMOOTH 2.2X20 (Screw) ×4 IMPLANT
PILLOW ARM CARTER ADULT (MISCELLANEOUS) ×1 IMPLANT
PLATE STD DVR LEFT (Plate) ×2 IMPLANT
PLATE STD DVR LT 24X55 (Plate) IMPLANT
SCREW LOCK 14X2.7X 3 LD TPR (Screw) IMPLANT
SCREW LOCKING 2.7X13MM (Screw) ×1 IMPLANT
SCREW LOCKING 2.7X14 (Screw) ×2 IMPLANT
SCRUB BETADINE 4OZ XXX (MISCELLANEOUS) ×2 IMPLANT
SOLUTION BETADINE 4OZ (MISCELLANEOUS) ×2 IMPLANT
SPLINT FIBERGLASS 3X35 (CAST SUPPLIES) ×1 IMPLANT
SPONGE LAP 4X18 X RAY DECT (DISPOSABLE) IMPLANT
SUT MNCRL AB 4-0 PS2 18 (SUTURE) ×2 IMPLANT
SUT PROLENE 3 0 PS 2 (SUTURE) IMPLANT
SUT PROLENE 4 0 PS 2 18 (SUTURE) ×2 IMPLANT
SUT VIC AB 3-0 FS2 27 (SUTURE) ×1 IMPLANT
SYR CONTROL 10ML LL (SYRINGE) IMPLANT
SYSTEM CHEST DRAIN TLS 7FR (DRAIN) ×1 IMPLANT
TOWEL OR 17X24 6PK STRL BLUE (TOWEL DISPOSABLE) ×2 IMPLANT
TOWEL OR 17X26 10 PK STRL BLUE (TOWEL DISPOSABLE) ×2 IMPLANT
TUBE CONNECTING 12X1/4 (SUCTIONS) ×2 IMPLANT
TUBE EVACUATION TLS (MISCELLANEOUS) ×2 IMPLANT
WATER STERILE IRR 1000ML POUR (IV SOLUTION) ×2 IMPLANT

## 2016-11-08 NOTE — Anesthesia Procedure Notes (Signed)
Anesthesia Regional Block: Interscalene brachial plexus block   Pre-Anesthetic Checklist: ,, timeout performed, Correct Patient, Correct Site, Correct Laterality, Correct Procedure, Correct Position, site marked, Risks and benefits discussed,  Surgical consent,  Pre-op evaluation,  At surgeon's request and post-op pain management  Laterality: Left  Prep: chloraprep       Needles:  Injection technique: Single-shot  Needle Type: Stimulator Needle - 40     Needle Length: 4cm  Needle Gauge: 22     Additional Needles:   Procedures: ultrasound guided,,,,,,,,  Narrative:  Start time: 11/08/2016 5:50 PM End time: 11/08/2016 5:55 PM Injection made incrementally with aspirations every 5 mL. Anesthesiologist: Nolon Nations  Additional Notes: BP cuff, EKG monitors applied. Sedation begun. Nerve location verified with U/S. Anesthetic injected incrementally, slowly , and after neg aspirations under direct u/s guidance. Good perineural spread. Tolerated well.

## 2016-11-08 NOTE — Transfer of Care (Signed)
Immediate Anesthesia Transfer of Care Note  Patient: Tracey Boyd  Procedure(s) Performed: Procedure(s): OPEN REDUCTION INTERNAL FIXATION (ORIF) LEFT WRIST FRACTURE (Left)  Patient Location: PACU  Anesthesia Type:MAC combined with regional for post-op pain  Level of Consciousness: awake, alert  and oriented  Airway & Oxygen Therapy: Patient connected to nasal cannula oxygen  Post-op Assessment: Report given to RN and Post -op Vital signs reviewed and stable  Post vital signs: Reviewed and stable  Last Vitals:  Vitals:   11/08/16 1415  BP: 106/66  Pulse: 91  Temp: 36.7 C    Last Pain:  Vitals:   11/08/16 1419  TempSrc:   PainSc: 2       Patients Stated Pain Goal: 5 (62/37/62 8315)  Complications: No apparent anesthesia complications

## 2016-11-08 NOTE — Op Note (Signed)
See [id number] Amedeo Plenty MD

## 2016-11-08 NOTE — Anesthesia Preprocedure Evaluation (Signed)
Anesthesia Evaluation  Patient identified by MRN, date of birth, ID band Patient awake    Reviewed: Allergy & Precautions, NPO status , Patient's Chart, lab work & pertinent test results  Airway Mallampati: II  TM Distance: >3 FB Neck ROM: Full    Dental no notable dental hx.    Pulmonary asthma ,    Pulmonary exam normal breath sounds clear to auscultation       Cardiovascular hypertension, Pt. on medications Normal cardiovascular exam Rhythm:Regular Rate:Normal     Neuro/Psych PSYCHIATRIC DISORDERS Anxiety negative neurological ROS     GI/Hepatic Neg liver ROS, GERD  ,  Endo/Other  negative endocrine ROSHypothyroidism   Renal/GU negative Renal ROS     Musculoskeletal negative musculoskeletal ROS (+)   Abdominal   Peds  Hematology negative hematology ROS (+) anemia ,   Anesthesia Other Findings   Reproductive/Obstetrics negative OB ROS                             Anesthesia Physical Anesthesia Plan  ASA: II  Anesthesia Plan: Regional   Post-op Pain Management:    Induction: Intravenous  Airway Management Planned:   Additional Equipment:   Intra-op Plan:   Post-operative Plan:   Informed Consent: I have reviewed the patients History and Physical, chart, labs and discussed the procedure including the risks, benefits and alternatives for the proposed anesthesia with the patient or authorized representative who has indicated his/her understanding and acceptance.   Dental advisory given  Plan Discussed with: CRNA  Anesthesia Plan Comments:         Anesthesia Quick Evaluation

## 2016-11-08 NOTE — Progress Notes (Signed)
Pharmacy Antibiotic Note  Tracey Boyd is a 66 y.o. female admitted on 11/08/2016 for ORIF left distal radius fracture.  Pharmacy has been consulted for vancomycin dosing d/t penicillin allergy. Scr 0.94, est. crcl ~ 50 ml/min. Received pre-op vancomycin 1g at [facility]: Vancomycin 500 mg IV Q 12 hrs, next dose tomorrow at 0800 F/u stop time Monitor renal function  Height: 5' 4.5" (163.8 cm) Weight: 140 lb (63.5 kg) IBW/kg (Calculated) : 55.85  Temp (24hrs), Avg:98.1 F (36.7 C), Min:97.7 F (36.5 C), Max:98.4 F (36.9 C)   Recent Labs Lab 11/08/16 1433  WBC 9.7  CREATININE 0.94    Estimated Creatinine Clearance: 52.7 mL/min (by C-G formula based on SCr of 0.94 mg/dL).    Allergies  Allergen Reactions  . Ace Inhibitors     REACTION: cough (lisinopril)  . Penicillins     REACTION: hives  . Sulfonamide Derivatives     REACTION: hives    Antimicrobials this admission: vancomycin 3/2 >>    Dose adjustments this admission:   Microbiology results:   Thank you for allowing pharmacy to be a part of this patient's care.  Maryanna Shape, PharmD, BCPS  Clinical Pharmacist  Pager: [phone]   11/08/2016 10:35 PM

## 2016-11-08 NOTE — H&P (Signed)
Tracey Boyd is an 66 y.o. female.   Chief Complaint: Displaced left distal radius fracture  HPI: Presents for ORIF left distal radius fracture Patient presents for evaluation and treatment of the of their upper extremity predicament. The patient denies neck, back, chest or  abdominal pain. The patient notes that they have no lower extremity problems. The patients primary complaint is noted. We are planning surgical care pathway for the upper extremity.  Past Medical History:  Diagnosis Date  . Anemia    as a child  . Anxiety   . Asthma   . Dermatitis herpetiformis   . GERD (gastroesophageal reflux disease)   . HTN (hypertension)   . Hypothyroidism     Past Surgical History:  Procedure Laterality Date  . APPENDECTOMY    . COLONOSCOPY    . CYSTECTOMY      Family History  Problem Relation Age of Onset  . Multiple sclerosis Mother   . Lung cancer Mother   . Colon polyps Mother   . Alcohol abuse Father   . Multiple sclerosis Sister   . Colon cancer Neg Hx    Social History:  reports that she has never smoked. She has never used smokeless tobacco. She reports that she drinks alcohol. She reports that she does not use drugs.  Allergies:  Allergies  Allergen Reactions  . Ace Inhibitors     REACTION: cough (lisinopril)  . Penicillins     REACTION: hives  . Sulfonamide Derivatives     REACTION: hives    Medications Prior to Admission  Medication Sig Dispense Refill  . amLODipine (NORVASC) 2.5 MG tablet Take 1 tablet (2.5 mg total) by mouth daily. 90 tablet 1  . aspirin 81 MG tablet Take 81 mg by mouth daily.      . Cholecalciferol (VITAMIN D3) 2000 UNITS TABS Take by mouth. Take 1 daily.    Marland Kitchen HYDROcodone-acetaminophen (NORCO/VICODIN) 5-325 MG tablet Take 1 tablet by mouth every 4 (four) hours as needed. 10 tablet 0  . levothyroxine (SYNTHROID, LEVOTHROID) 50 MCG tablet Take 50 mcg by mouth daily before breakfast.    . loratadine (CLARITIN) 10 MG tablet Take 10 mg by  mouth daily.    . mometasone-formoterol (DULERA) 100-5 MCG/ACT AERO Inhale 2 puffs into the lungs daily.     . Multiple Vitamin (MULTIVITAMIN) tablet Take 1 tablet by mouth daily.    Marland Kitchen omeprazole (PRILOSEC) 40 MG capsule Take 1 capsule (40 mg total) by mouth daily. 90 capsule 1  . albuterol (PROAIR HFA) 108 (90 Base) MCG/ACT inhaler Inhale 2 puffs into the lungs every 6 (six) hours as needed. 18 g 1  . famotidine (PEPCID) 20 MG tablet One at bedtime 30 tablet 2  . Respiratory Therapy Supplies (FLUTTER) DEVI Use as directed 1 each 0  . traMADol (ULTRAM) 50 MG tablet 1-2 every 4 hours as needed for cough or pain 40 tablet 0    Results for orders placed or performed during the hospital encounter of 11/08/16 (from the past 48 hour(s))  CBC     Status: None   Collection Time: 11/08/16  2:33 PM  Result Value Ref Range   WBC 9.7 4.0 - 10.5 K/uL   RBC 4.68 3.87 - 5.11 MIL/uL   Hemoglobin 14.0 12.0 - 15.0 g/dL   HCT 41.8 36.0 - 46.0 %   MCV 89.3 78.0 - 100.0 fL   MCH 29.9 26.0 - 34.0 pg   MCHC 33.5 30.0 - 36.0 g/dL   RDW 14.2  11.5 - 15.5 %   Platelets 217 150 - 400 K/uL  Basic metabolic panel     Status: Abnormal   Collection Time: 11/08/16  2:33 PM  Result Value Ref Range   Sodium 139 135 - 145 mmol/L   Potassium 4.9 3.5 - 5.1 mmol/L    Comment: SPECIMEN HEMOLYZED. HEMOLYSIS MAY AFFECT INTEGRITY OF RESULTS.   Chloride 105 101 - 111 mmol/L   CO2 20 (L) 22 - 32 mmol/L   Glucose, Bld 94 65 - 99 mg/dL   BUN 20 6 - 20 mg/dL   Creatinine, Ser 0.94 0.44 - 1.00 mg/dL   Calcium 9.3 8.9 - 10.3 mg/dL   GFR calc non Af Amer >60 >60 mL/min   GFR calc Af Amer >60 >60 mL/min    Comment: (NOTE) The eGFR has been calculated using the CKD EPI equation. This calculation has not been validated in all clinical situations. eGFR's persistently <60 mL/min signify possible Chronic Kidney Disease.    Anion gap 14 5 - 15   No results found.  Review of Systems  Constitutional: Negative.    Cardiovascular: Negative.   Gastrointestinal: Negative.   Genitourinary: Negative.     Blood pressure 106/66, pulse 91, temperature 98.1 F (36.7 C), temperature source Oral, height '5\' 4"'  (1.626 m), weight 63.5 kg (140 lb), SpO2 97 %. Physical Exam left distal radius fracture comminuted complex.  She has no evidence of infection dystrophy or compartment syndrome. She has marked swelling. This is a volar Barton's type distribution  The patient is alert and oriented in no acute distress. The patient complains of pain in the affected upper extremity.  The patient is noted to have a normal HEENT exam. Lung fields show equal chest expansion and no shortness of breath. Abdomen exam is nontender without distention. Lower extremity examination does not show any fracture dislocation or blood clot symptoms. Pelvis is stable and the neck and back are stable and nontender.  Assessment/Plan We are planning surgery for your upper extremity. The risk and benefits of surgery to include risk of bleeding, infection, anesthesia,  damage to normal structures and failure of the surgery to accomplish its intended goals of relieving symptoms and restoring function have been discussed in detail. With this in mind we plan to proceed. I have specifically discussed with the patient the pre-and postoperative regime and the dos and don'ts and risk and benefits in great detail. Risk and benefits of surgery also include risk of dystrophy(CRPS), chronic nerve pain, failure of the healing process to go onto completion and other inherent risks of surgery The relavent the pathophysiology of the disease/injury process, as well as the alternatives for treatment and postoperative course of action has been discussed in great detail with the patient who desires to proceed.  We will do everything in our power to help you (the patient) restore function to the upper extremity. It is a pleasure to see this patient today.   Paulene Floor, MD 11/08/2016, 5:25 PM

## 2016-11-09 DIAGNOSIS — S52572A Other intraarticular fracture of lower end of left radius, initial encounter for closed fracture: Secondary | ICD-10-CM | POA: Diagnosis not present

## 2016-11-09 MED ORDER — METHOCARBAMOL 500 MG PO TABS: 500.0000 mg | ORAL_TABLET | Freq: Four times a day (QID) | ORAL | 0 refills | Status: DC | PRN

## 2016-11-09 MED ORDER — OXYCODONE HCL 5 MG PO TABS: 5.0000 mg | ORAL_TABLET | ORAL | 0 refills | Status: DC | PRN

## 2016-11-09 NOTE — Discharge Summary (Signed)
Physician Discharge Summary  Patient ID: Tracey Boyd MRN: [id number] DOB/AGE: August 12, 1951 66 y.o.  Admit date: 11/08/2016 Discharge date:   Admission Diagnoses: LEFT WRIST FRACTURE Past Medical History:  Diagnosis Date  . Anemia    as a child  . Anxiety   . Asthma   . Dermatitis herpetiformis   . GERD (gastroesophageal reflux disease)   . HTN (hypertension)   . Hypothyroidism     Discharge Diagnoses:  Active Problems:   Closed fracture of left distal radius   Surgeries: Procedure(s): OPEN REDUCTION INTERNAL FIXATION (ORIF) LEFT WRIST FRACTURE on 11/08/2016    Consultants:   Discharged Condition: Improved  Hospital Course: Tracey Boyd is an 66 y.o. female who was admitted 11/08/2016 with a chief complaint of No chief complaint on file. , and found to have a diagnosis of LEFT WRIST FRACTURE.  They were brought to the operating room on 11/08/2016 and underwent Procedure(s): OPEN REDUCTION INTERNAL FIXATION (ORIF) LEFT WRIST FRACTURE.    They were given perioperative antibiotics: Anti-infectives    Start     Dose/Rate Route Frequency Ordered Stop   11/09/16 0800  vancomycin (VANCOCIN) 500 mg in sodium chloride 0.9 % 100 mL IVPB     500 mg 100 mL/hr over 60 Minutes Intravenous Every 12 hours 11/08/16 2243     11/09/16 0600  vancomycin (VANCOCIN) IVPB 1000 mg/200 mL premix     1,000 mg 200 mL/hr over 60 Minutes Intravenous On call to O.R. 11/08/16 1404 11/08/16 1920    .  They were given sequential compression devices, early ambulation, and Other (comment) for DVT prophylaxis.  Recent vital signs: Patient Vitals for the past 24 hrs:  BP Temp Temp src Pulse Resp SpO2 Height Weight  11/09/16 0404 104/68 99.4 F (37.4 C) Oral 77 16 95 % - -  11/09/16 0035 120/77 98.3 F (36.8 C) Oral 81 16 100 % - -  11/08/16 2231 131/79 98.4 F (36.9 C) Oral 77 16 95 % 5' 4.5" (1.638 m) -  11/08/16 2200 126/82 98 F (36.7 C) - 80 14 100 % - -  11/08/16 2145 112/81 - - 80 18 96 % - -   11/08/16 2130 117/73 - - 71 12 97 % - -  11/08/16 2115 110/78 - - 69 10 100 % - -  11/08/16 2100 118/71 - - 78 14 100 % - -  11/08/16 2054 (!) 102/54 97.7 F (36.5 C) - 77 18 100 % - -  11/08/16 1429 - - - - - - 5\' 4"  (1.626 m) 63.5 kg (140 lb)  11/08/16 1415 106/66 98.1 F (36.7 C) Oral 91 - 97 % - -  .  Recent laboratory studies: No results found.  Discharge Medications:   Allergies as of 11/09/2016      Reactions   Ace Inhibitors    REACTION: cough (lisinopril)   Penicillins    REACTION: hives   Sulfonamide Derivatives    REACTION: hives      Medication List    TAKE these medications   albuterol 108 (90 Base) MCG/ACT inhaler Commonly known as:  PROAIR HFA Inhale 2 puffs into the lungs every 6 (six) hours as needed.   amLODipine 2.5 MG tablet Commonly known as:  NORVASC Take 1 tablet (2.5 mg total) by mouth daily.   aspirin 81 MG tablet Take 81 mg by mouth daily.   famotidine 20 MG tablet Commonly known as:  PEPCID One at bedtime   FLUTTER Devi Use as directed  HYDROcodone-acetaminophen 5-325 MG tablet Commonly known as:  NORCO/VICODIN Take 1 tablet by mouth every 4 (four) hours as needed.   levothyroxine 50 MCG tablet Commonly known as:  SYNTHROID, LEVOTHROID Take 50 mcg by mouth daily before breakfast.   loratadine 10 MG tablet Commonly known as:  CLARITIN Take 10 mg by mouth daily.   methocarbamol 500 MG tablet Commonly known as:  ROBAXIN Take 1 tablet (500 mg total) by mouth every 6 (six) hours as needed for muscle spasms.   mometasone-formoterol 100-5 MCG/ACT Aero Commonly known as:  DULERA Inhale 2 puffs into the lungs daily.   multivitamin tablet Take 1 tablet by mouth daily.   omeprazole 40 MG capsule Commonly known as:  PRILOSEC Take 1 capsule (40 mg total) by mouth daily.   oxyCODONE 5 MG immediate release tablet Commonly known as:  Oxy IR/ROXICODONE Take 1-2 tablets (5-10 mg total) by mouth every 3 (three) hours as needed for  moderate pain.   traMADol 50 MG tablet Commonly known as:  ULTRAM 1-2 every 4 hours as needed for cough or pain   Vitamin D3 2000 units Tabs Take by mouth. Take 1 daily.       Diagnostic Studies: Dg Wrist Complete Left  Result Date: 11/06/2016 CLINICAL DATA:  Trip and fall with left wrist pain and deformity, initial encounter EXAM: LEFT WRIST - COMPLETE 3+ VIEW COMPARISON:  None. FINDINGS: Transverse fracture through the distal radius is noted with impaction and anterior angulation at the fracture site. The radial fracture extends into the radiocarpal joint posteriorly. Ulnar styloid fracture is noted as well. No other fractures are seen. IMPRESSION: Distal radial and ulnar fractures as described Electronically Signed   By: Inez Catalina M.D.   On: 11/06/2016 15:40    They benefited maximally from their hospital stay and there were no complications.     Disposition: 01-Home or Self Care Discharge Instructions    Call MD / Call 911    Complete by:  As directed    If you experience chest pain or shortness of breath, CALL 911 and be transported to the hospital emergency room.  If you develope a fever above 101 F, pus (white drainage) or increased drainage or redness at the wound, or calf pain, call your surgeon's office.   Constipation Prevention    Complete by:  As directed    Drink plenty of fluids.  Prune juice may be helpful.  You may use a stool softener, such as Colace (over the counter) 100 mg twice a day.  Use MiraLax (over the counter) for constipation as needed.   Diet - low sodium heart healthy    Complete by:  As directed    Increase activity slowly as tolerated    Complete by:  As directed      Follow-up Information    Paulene Floor, MD Follow up.   Specialty:  Orthopedic Surgery Contact information: [address] [place] [address] [id number]            Signed: Paulene Floor 11/09/2016, 8:45 AM

## 2016-11-09 NOTE — Anesthesia Postprocedure Evaluation (Addendum)
Anesthesia Post Note  Patient: Tracey Boyd  Procedure(s) Performed: Procedure(s) (LRB): OPEN REDUCTION INTERNAL FIXATION (ORIF) LEFT WRIST FRACTURE (Left)  Patient location during evaluation: PACU Anesthesia Type: Regional Level of consciousness: awake and alert Pain management: pain level controlled Vital Signs Assessment: post-procedure vital signs reviewed and stable Respiratory status: spontaneous breathing, nonlabored ventilation, respiratory function stable and patient connected to nasal cannula oxygen Cardiovascular status: stable and blood pressure returned to baseline Anesthetic complications: no       Last Vitals:  Vitals:   11/09/16 0035 11/09/16 0404  BP: 120/77 104/68  Pulse: 81 77  Resp: 16 16  Temp: 36.8 C 37.4 C    Last Pain:  Vitals:   11/09/16 0404  TempSrc: Oral  PainSc:                  [name]

## 2016-11-09 NOTE — Evaluation (Signed)
Occupational Therapy Evaluation/Discharge Patient Details Name: Tracey Boyd MRN: [id number] DOB: 1951-01-04 Today's Date: 11/09/2016    History of Present Illness Pt is a 66 y.o. female s/p ORIF L wrist fracture. She has a PMH significant for anemia (as a child), anxiety, asthma, dermatitis herpetiformis, GERD, HTN, and hypothyroidism.   Clinical Impression   PTA, pt was independent with ADL and functional mobility. She currently requires mod assist for UB dressing (due to tight fitting sleeve) and min assist for LB dressing as well as supervision for toilet transfers. Pt and son educated concerning edema management post-operatively including use of ice, elevation, and retrograde massage. Additionally educated concerning safety and precautions post-operatively. They demonstrate understanding of all topics. Pt will have 24 hour assistance at home. Recommend progression of rehabilitation per MD orders. No further acute OT needs and OT will sign off.     Follow Up Recommendations  Supervision/Assistance - 24 hour (Progress rehab per MD)    Equipment Recommendations  None recommended by OT    Recommendations for Other Services       Precautions / Restrictions Precautions Precautions: Fall Restrictions Weight Bearing Restrictions: Yes LUE Weight Bearing: Non weight bearing      Mobility Bed Mobility               General bed mobility comments: OOB in chair on arrival  Transfers Overall transfer level: Needs assistance   Transfers: Sit to/from Stand Sit to Stand: Supervision         General transfer comment: Supervision for safety. Pt slightly off balance due to L wrist bulky splint.    Balance Overall balance assessment: No apparent balance deficits (not formally assessed)                                          ADL Overall ADL's : Needs assistance/impaired Eating/Feeding: Set up;Sitting   Grooming: Minimal assistance;Standing   Upper Body  Bathing: Sitting;Minimal assistance   Lower Body Bathing: Sit to/from stand;Min guard Lower Body Bathing Details (indicate cue type and reason): Supervision sit<>stand Upper Body Dressing : Moderate assistance;Sitting Upper Body Dressing Details (indicate cue type and reason): Mod assist due to tight shirt sleeve. Lower Body Dressing: Minimal assistance;Sit to/from stand   Toilet Transfer: Supervision/safety;Ambulation;Regular Toilet   Toileting- Water quality scientist and Hygiene: Supervision/safety;Sit to/from stand       Functional mobility during ADLs: Supervision/safety General ADL Comments: Educated pt and son on retrograde massage, ice, and elevation for edema control. Additionally educated on compensatory dressing techniques.      Vision Patient Visual Report: No change from baseline Vision Assessment?: No apparent visual deficits     Perception     Praxis      Pertinent Vitals/Pain Pain Assessment: Faces Faces Pain Scale: Hurts even more Pain Location: L wrist Pain Descriptors / Indicators: Operative site guarding;Grimacing;Sore Pain Intervention(s): Monitored during session;Repositioned     Hand Dominance Left   Extremity/Trunk Assessment Upper Extremity Assessment Upper Extremity Assessment: LUE deficits/detail LUE Deficits / Details: Decreased strength and ROM as expected post-operatively. Significant edema in L fingertips. LUE: Unable to fully assess due to pain;Unable to fully assess due to immobilization   Lower Extremity Assessment Lower Extremity Assessment: Overall WFL for tasks assessed       Communication Communication Communication: No difficulties   Cognition Arousal/Alertness: Awake/alert Behavior During Therapy: WFL for tasks assessed/performed Overall Cognitive Status: Within Functional  Limits for tasks assessed                     General Comments       Exercises Exercises: Hand exercises     Shoulder Instructions       Home Living Family/patient expects to be discharged to:: Private residence Living Arrangements: Spouse/significant other Available Help at Discharge: Family;Available 24 hours/day Type of Home: House Home Access: Stairs to enter [employer] of Steps: 3   Home Layout: Two level;Able to live on main level with bedroom/bathroom     Bathroom Shower/Tub: Occupational psychologist: Standard     Home Equipment: None          Prior Functioning/Environment Level of Independence: Independent        Comments: Does regular volunteer work.        OT Problem List: Decreased strength;Decreased range of motion;Decreased activity tolerance;Decreased safety awareness;Decreased knowledge of precautions;Pain;Impaired UE functional use      OT Treatment/Interventions:      OT Goals(Current goals can be found in the care plan section) Acute Rehab OT Goals Patient Stated Goal: to get back to doing things for herself OT Goal Formulation: With patient/family Time For Goal Achievement: 11/16/16 Potential to Achieve Goals: Good  OT Frequency:     Barriers to D/C:            Co-evaluation              End of Session Equipment Utilized During Treatment: Other (comment) (Elevation pillow)  Activity Tolerance: Patient tolerated treatment well Patient left: in chair;with call bell/phone within reach;with family/visitor present  OT Visit Diagnosis: Pain Pain - Right/Left: Left Pain - part of body: Arm (wrist)                ADL either performed or assessed with clinical judgement  Time: 1047-1109 OT Time Calculation (min): 22 min Charges:  OT General Charges $OT Visit: 1 Procedure OT Evaluation $OT Eval Moderate Complexity: 1 Procedure G-Codes: OT G-codes **NOT FOR INPATIENT CLASS** Functional Assessment Tool Used: AM-PAC 6 Clicks Daily Activity Functional Limitation: Self care Self Care Current Status [id number]): At least 40 percent but less than 60  percent impaired, limited or restricted Self Care Goal Status [id number]): At least 40 percent but less than 60 percent impaired, limited or restricted Self Care Discharge Status [phone]): At least 40 percent but less than 60 percent impaired, limited or restricted   Norman Herrlich, MS OTR/L  Pager: [place] 11/09/2016, 12:22 PM

## 2016-11-09 NOTE — Care Management Obs Status (Signed)
[place] NOTIFICATION   Patient Details  Name: Tracey Boyd MRN: [id number] Date of Birth: [birth year]   Medicare Observation Status Notification Given:  Yes Patient's son signed for patient. She is left handed and surgery was on her left wrist.   Ninfa Meeker, RN 11/09/2016, 11:23 AM

## 2016-11-09 NOTE — Discharge Instructions (Signed)
Keep bandage clean and dry.  Call for any problems.  No smoking.  Criteria for driving a car: you should be off your pain medicine for 7-8 hours, able to drive one handed(confident), thinking clearly and feeling able in your judgement to drive. Continue elevation as it will decrease swelling. Move your fingers within the confines of the bandage/splint.  Use ice if instructed by your MD. Call immediately for any sudden loss of feeling in your hand/arm or change in functional abilities of the extremity. We recommend that you to take vitamin C 1000 mg a day to promote healing. We also recommend that if you require  pain medicine that you take a stool softener to prevent constipation as most pain medicines will have constipation side effects. We recommend either Peri-Colace or Senokot and recommend that you also consider adding MiraLAX as well to prevent the constipation affects from pain medicine if you are required to use them. These medicines are over the counter and may be purchased at a local pharmacy. A cup of yogurt and a probiotic can also be helpful during the recovery process as the medicines can disrupt your intestinal environment.

## 2016-11-11 ENCOUNTER — Encounter (HOSPITAL_COMMUNITY): Payer: Self-pay | Admitting: Orthopedic Surgery

## 2016-11-11 NOTE — Op Note (Signed)
NAMEJOYE, DORADO NO.:  [id number]  MEDICAL RECORD NO.:  [id number]  LOCATION:                                 FACILITY:  PHYSICIAN:  Satira Anis. Shaneese Tait, M.D.DATE OF BIRTH:  06/02/1951  DATE OF PROCEDURE:11/08/2016 DATE OF DISCHARGE:TBD                              OPERATIVE REPORT   PREOPERATIVE DIAGNOSES:  Comminuted complex intra-articular distal radius fracture, volar Barton's in nature with associated ulnar styloid fracture.  POSTOPERATIVE DIAGNOSES:  Comminuted complex intra-articular distal radius fracture, volar Barton's in nature with associated ulnar styloid fracture.  PROCEDURE: 1. Open reduction and internal fixation of comminuted complex greater     than 5-part intra-articular distal radius fracture with extended     DVR volar rim plate from [employer]. 2. Four-view radiographic series. 3. Fluoroscopy of left wrist performed, examined and interpreted by     myself. 4. Closed treatment, ulnar styloid fracture.  ASSISTANT:  None.  COMPLICATIONS:  None.  DRAINS:  One TLS drain.  INDICATIONS:  The patient is a pleasant female, presents with the above- mentioned diagnosis.  She has very comminuted fracture with large amount of swelling.  She is sensate.  I have discussed the risks and benefits of the surgery, and she desires to proceed.  OPERATION IN DETAIL:  The patient was seen by myself and Anesthesia, preoperative block was placed about the left upper extremity.  Arm was marked.  She was taken to the procedure suite, underwent smooth induction of IV sedation, prepped and draped with Betadine scrub and paint was accomplished.  Time-out was observed.  Operation commenced with the elevation of the tourniquet, volar radial incision was made. Dissection was carried down.  FCR tendon sheath was incised proximally palmarly and dorsally.  The complete sheath was decompressed and superficial fasciotomy was accomplished via this technique.  I  then retracted the carpal canal contents ulnarly.  I incised the pronator and then went about reassembling the distal radius.  Provisional fixation with K-wire was held.  There were no complicating features.  Following provisional fixation, the patient then underwent application of an extended standard volar rim plate with very careful attention to detail. I was able to reduce it nicely.  The distal radioulnar joint appeared to be normal.  The radial height inclination and volar tilt were stored to satisfactory parameters.  Screws and plate were checked for length, height, and other orthopedic alignment.  I was pleased with this.  Following this, I irrigated copiously and closed the pronator.  Following this, I turned attention towards the wrist.  The scapholunate and had some widening in my opinion, but there was no dynamic instability with stress testing and the capitate head did not seem to change the relationship in terms of its compression.  With this noted, I checked the distal radioulnar joint.  With the patient in firmed pronation, she had a little bit of give in my opinion but nothing to suggest gross instability.  She also had a very small ulnar styloid fracture and thus chose for closed treatment, but we will keep an eye on this and hold her in a sugar-tong type apparatus for 4 weeks.  She tolerated this well.  Wound  was closed with Prolene over TLS drain. There were no complicating features.  Tourniquet was deflated at less than an hour, and all sponge, needle, and instrument counts reported as correct.  The date of procedure and date of operation November 08, 2016.  Going forward, we will plan for sugar-tong immobilization to limit pronation, supination, and neutral position for the first 4 weeks, then go to short-arm and gentle interval range of motion in therapy from weeks 4 through 8.  We will monitor her with x-ray series and make sure things go smoothly.  These  notes have been discussed.  She will be spending the night for IV antibiotics and general postop observation as well as pain management.     Satira Anis. Amedeo Plenty, M.D.   ______________________________ Satira Anis. Amedeo Plenty, M.D.    [facility]  D:  11/08/2016  T:  11/09/2016  Job:  [id number]

## 2016-11-22 DIAGNOSIS — S52572D Other intraarticular fracture of lower end of left radius, subsequent encounter for closed fracture with routine healing: Secondary | ICD-10-CM | POA: Diagnosis not present

## 2016-12-05 DIAGNOSIS — Z4789 Encounter for other orthopedic aftercare: Secondary | ICD-10-CM | POA: Diagnosis not present

## 2016-12-05 DIAGNOSIS — S52502D Unspecified fracture of the lower end of left radius, subsequent encounter for closed fracture with routine healing: Secondary | ICD-10-CM | POA: Diagnosis not present

## 2016-12-09 DIAGNOSIS — S52502D Unspecified fracture of the lower end of left radius, subsequent encounter for closed fracture with routine healing: Secondary | ICD-10-CM | POA: Diagnosis not present

## 2016-12-12 DIAGNOSIS — S52502D Unspecified fracture of the lower end of left radius, subsequent encounter for closed fracture with routine healing: Secondary | ICD-10-CM | POA: Diagnosis not present

## 2016-12-16 DIAGNOSIS — S52502D Unspecified fracture of the lower end of left radius, subsequent encounter for closed fracture with routine healing: Secondary | ICD-10-CM | POA: Diagnosis not present

## 2016-12-18 DIAGNOSIS — S52502D Unspecified fracture of the lower end of left radius, subsequent encounter for closed fracture with routine healing: Secondary | ICD-10-CM | POA: Diagnosis not present

## 2016-12-23 DIAGNOSIS — S52502D Unspecified fracture of the lower end of left radius, subsequent encounter for closed fracture with routine healing: Secondary | ICD-10-CM | POA: Diagnosis not present

## 2016-12-25 DIAGNOSIS — S52502A Unspecified fracture of the lower end of left radius, initial encounter for closed fracture: Secondary | ICD-10-CM | POA: Diagnosis not present

## 2016-12-30 DIAGNOSIS — Z4789 Encounter for other orthopedic aftercare: Secondary | ICD-10-CM | POA: Diagnosis not present

## 2016-12-30 DIAGNOSIS — S52502D Unspecified fracture of the lower end of left radius, subsequent encounter for closed fracture with routine healing: Secondary | ICD-10-CM | POA: Diagnosis not present

## 2017-01-01 DIAGNOSIS — S52502D Unspecified fracture of the lower end of left radius, subsequent encounter for closed fracture with routine healing: Secondary | ICD-10-CM | POA: Diagnosis not present

## 2017-01-07 DIAGNOSIS — S52502D Unspecified fracture of the lower end of left radius, subsequent encounter for closed fracture with routine healing: Secondary | ICD-10-CM | POA: Diagnosis not present

## 2017-01-09 DIAGNOSIS — S52502A Unspecified fracture of the lower end of left radius, initial encounter for closed fracture: Secondary | ICD-10-CM | POA: Diagnosis not present

## 2017-01-14 DIAGNOSIS — S52502D Unspecified fracture of the lower end of left radius, subsequent encounter for closed fracture with routine healing: Secondary | ICD-10-CM | POA: Diagnosis not present

## 2017-01-16 DIAGNOSIS — S52502D Unspecified fracture of the lower end of left radius, subsequent encounter for closed fracture with routine healing: Secondary | ICD-10-CM | POA: Diagnosis not present

## 2017-01-21 DIAGNOSIS — S52502D Unspecified fracture of the lower end of left radius, subsequent encounter for closed fracture with routine healing: Secondary | ICD-10-CM | POA: Diagnosis not present

## 2017-01-23 DIAGNOSIS — G5602 Carpal tunnel syndrome, left upper limb: Secondary | ICD-10-CM | POA: Diagnosis not present

## 2017-01-23 DIAGNOSIS — S52502D Unspecified fracture of the lower end of left radius, subsequent encounter for closed fracture with routine healing: Secondary | ICD-10-CM | POA: Diagnosis not present

## 2017-01-28 DIAGNOSIS — S52502D Unspecified fracture of the lower end of left radius, subsequent encounter for closed fracture with routine healing: Secondary | ICD-10-CM | POA: Diagnosis not present

## 2017-01-30 DIAGNOSIS — S52502D Unspecified fracture of the lower end of left radius, subsequent encounter for closed fracture with routine healing: Secondary | ICD-10-CM | POA: Diagnosis not present

## 2017-02-05 DIAGNOSIS — S52502D Unspecified fracture of the lower end of left radius, subsequent encounter for closed fracture with routine healing: Secondary | ICD-10-CM | POA: Diagnosis not present

## 2017-02-12 DIAGNOSIS — S52502D Unspecified fracture of the lower end of left radius, subsequent encounter for closed fracture with routine healing: Secondary | ICD-10-CM | POA: Diagnosis not present

## 2017-02-17 DIAGNOSIS — S52572D Other intraarticular fracture of lower end of left radius, subsequent encounter for closed fracture with routine healing: Secondary | ICD-10-CM | POA: Diagnosis not present

## 2017-02-17 DIAGNOSIS — G5602 Carpal tunnel syndrome, left upper limb: Secondary | ICD-10-CM | POA: Diagnosis not present

## 2017-02-20 DIAGNOSIS — S52502D Unspecified fracture of the lower end of left radius, subsequent encounter for closed fracture with routine healing: Secondary | ICD-10-CM | POA: Diagnosis not present

## 2017-02-24 DIAGNOSIS — S52502D Unspecified fracture of the lower end of left radius, subsequent encounter for closed fracture with routine healing: Secondary | ICD-10-CM | POA: Diagnosis not present

## 2017-03-04 DIAGNOSIS — S52502D Unspecified fracture of the lower end of left radius, subsequent encounter for closed fracture with routine healing: Secondary | ICD-10-CM | POA: Diagnosis not present

## 2017-03-13 ENCOUNTER — Telehealth: Payer: Self-pay

## 2017-03-13 NOTE — Telephone Encounter (Signed)
Patient is on the list for [employer] 2018 and may be a good candidate for an AWV. Please let me know if/when appt is scheduled.   

## 2017-03-14 NOTE — Telephone Encounter (Signed)
Called pt to schedule awv. Lvm for pt to call office to schedule appt.  °

## 2017-03-19 DIAGNOSIS — S52572D Other intraarticular fracture of lower end of left radius, subsequent encounter for closed fracture with routine healing: Secondary | ICD-10-CM | POA: Diagnosis not present

## 2017-03-19 DIAGNOSIS — G5602 Carpal tunnel syndrome, left upper limb: Secondary | ICD-10-CM | POA: Diagnosis not present

## 2017-03-26 DIAGNOSIS — R69 Illness, unspecified: Secondary | ICD-10-CM | POA: Diagnosis not present

## 2017-04-02 ENCOUNTER — Other Ambulatory Visit: Payer: Self-pay | Admitting: Internal Medicine

## 2017-04-02 DIAGNOSIS — K219 Gastro-esophageal reflux disease without esophagitis: Secondary | ICD-10-CM

## 2017-04-30 ENCOUNTER — Encounter (HOSPITAL_COMMUNITY): Payer: Self-pay | Admitting: Orthopedic Surgery

## 2017-04-30 NOTE — Addendum Note (Signed)
Addendum  created 04/30/17 1201 by Albertha Ghee, MD   Sign clinical note

## 2017-06-09 DIAGNOSIS — D1801 Hemangioma of skin and subcutaneous tissue: Secondary | ICD-10-CM | POA: Diagnosis not present

## 2017-06-09 DIAGNOSIS — D485 Neoplasm of uncertain behavior of skin: Secondary | ICD-10-CM | POA: Diagnosis not present

## 2017-06-09 DIAGNOSIS — D2272 Melanocytic nevi of left lower limb, including hip: Secondary | ICD-10-CM | POA: Diagnosis not present

## 2017-06-09 DIAGNOSIS — L812 Freckles: Secondary | ICD-10-CM | POA: Diagnosis not present

## 2017-06-09 DIAGNOSIS — L821 Other seborrheic keratosis: Secondary | ICD-10-CM | POA: Diagnosis not present

## 2017-06-09 DIAGNOSIS — D225 Melanocytic nevi of trunk: Secondary | ICD-10-CM | POA: Diagnosis not present

## 2017-06-18 ENCOUNTER — Other Ambulatory Visit: Payer: Self-pay | Admitting: Internal Medicine

## 2017-06-18 DIAGNOSIS — I10 Essential (primary) hypertension: Secondary | ICD-10-CM

## 2017-07-02 DIAGNOSIS — R69 Illness, unspecified: Secondary | ICD-10-CM | POA: Diagnosis not present

## 2017-07-03 DIAGNOSIS — Z780 Asymptomatic menopausal state: Secondary | ICD-10-CM | POA: Diagnosis not present

## 2017-07-03 DIAGNOSIS — Z124 Encounter for screening for malignant neoplasm of cervix: Secondary | ICD-10-CM | POA: Diagnosis not present

## 2017-07-03 DIAGNOSIS — Z6826 Body mass index (BMI) 26.0-26.9, adult: Secondary | ICD-10-CM | POA: Diagnosis not present

## 2017-07-04 LAB — HM PAP SMEAR

## 2017-07-16 ENCOUNTER — Other Ambulatory Visit (INDEPENDENT_AMBULATORY_CARE_PROVIDER_SITE_OTHER): Payer: Medicare HMO

## 2017-07-16 ENCOUNTER — Ambulatory Visit (INDEPENDENT_AMBULATORY_CARE_PROVIDER_SITE_OTHER): Payer: Medicare HMO | Admitting: Internal Medicine

## 2017-07-16 ENCOUNTER — Encounter: Payer: Self-pay | Admitting: Internal Medicine

## 2017-07-16 VITALS — BP 126/74 | HR 78 | Temp 97.6°F | Resp 16 | Ht 64.5 in | Wt 153.0 lb

## 2017-07-16 DIAGNOSIS — I10 Essential (primary) hypertension: Secondary | ICD-10-CM

## 2017-07-16 DIAGNOSIS — J4540 Moderate persistent asthma, uncomplicated: Secondary | ICD-10-CM | POA: Diagnosis not present

## 2017-07-16 DIAGNOSIS — E785 Hyperlipidemia, unspecified: Secondary | ICD-10-CM

## 2017-07-16 DIAGNOSIS — Z23 Encounter for immunization: Secondary | ICD-10-CM | POA: Diagnosis not present

## 2017-07-16 DIAGNOSIS — M8000XA Age-related osteoporosis with current pathological fracture, unspecified site, initial encounter for fracture: Secondary | ICD-10-CM | POA: Insufficient documentation

## 2017-07-16 DIAGNOSIS — M8000XD Age-related osteoporosis with current pathological fracture, unspecified site, subsequent encounter for fracture with routine healing: Secondary | ICD-10-CM

## 2017-07-16 DIAGNOSIS — E039 Hypothyroidism, unspecified: Secondary | ICD-10-CM

## 2017-07-16 DIAGNOSIS — M810 Age-related osteoporosis without current pathological fracture: Secondary | ICD-10-CM | POA: Insufficient documentation

## 2017-07-16 DIAGNOSIS — J301 Allergic rhinitis due to pollen: Secondary | ICD-10-CM | POA: Diagnosis not present

## 2017-07-16 DIAGNOSIS — Z0000 Encounter for general adult medical examination without abnormal findings: Secondary | ICD-10-CM

## 2017-07-16 DIAGNOSIS — E2839 Other primary ovarian failure: Secondary | ICD-10-CM | POA: Diagnosis not present

## 2017-07-16 DIAGNOSIS — Z1231 Encounter for screening mammogram for malignant neoplasm of breast: Secondary | ICD-10-CM | POA: Insufficient documentation

## 2017-07-16 LAB — COMPREHENSIVE METABOLIC PANEL
ALK PHOS: 69 U/L (ref 39–117)
ALT: 13 U/L (ref 0–35)
AST: 20 U/L (ref 0–37)
Albumin: 4.5 g/dL (ref 3.5–5.2)
BUN: 18 mg/dL (ref 6–23)
CO2: 29 mEq/L (ref 19–32)
Calcium: 9.7 mg/dL (ref 8.4–10.5)
Chloride: 104 mEq/L (ref 96–112)
Creatinine, Ser: 0.84 mg/dL (ref 0.40–1.20)
GFR: 72.02 mL/min (ref >60.00)
GLUCOSE: 91 mg/dL (ref 70–99)
POTASSIUM: 3.6 meq/L (ref 3.5–5.1)
SODIUM: 141 meq/L (ref 135–145)
TOTAL PROTEIN: 7.9 g/dL (ref 6.0–8.3)
Total Bilirubin: 0.4 mg/dL (ref 0.2–1.2)

## 2017-07-16 LAB — CBC WITH DIFFERENTIAL/PLATELET
Basophils Absolute: 0 10*3/uL (ref 0.0–0.1)
Basophils Relative: 0.7 % (ref 0.0–3.0)
EOS ABS: 0.6 10*3/uL (ref 0.0–0.7)
EOS PCT: 8.9 % — AB (ref 0.0–5.0)
HCT: 43.3 % (ref 36.0–46.0)
Hemoglobin: 14.3 g/dL (ref 12.0–15.0)
LYMPHS ABS: 1.8 10*3/uL (ref 0.7–4.0)
Lymphocytes Relative: 25.3 % (ref 12.0–46.0)
MCHC: 33 g/dL (ref 30.0–36.0)
MCV: 91.7 fl (ref 78.0–100.0)
MONO ABS: 0.6 10*3/uL (ref 0.1–1.0)
Monocytes Relative: 8.2 % (ref 3.0–12.0)
NEUTROS PCT: 56.9 % (ref 43.0–77.0)
Neutro Abs: 4 10*3/uL (ref 1.4–7.7)
Platelets: 265 10*3/uL (ref 150.0–400.0)
RBC: 4.73 Mil/uL (ref 3.87–5.11)
RDW: 13.8 % (ref 11.5–15.5)
WBC: 7 10*3/uL (ref 4.0–10.5)

## 2017-07-16 LAB — LIPID PANEL
Cholesterol: 197 mg/dL (ref 0–200)
HDL: 60.3 mg/dL (ref >39.00)
LDL Cholesterol: 115 mg/dL — ABNORMAL HIGH (ref 0–99)
NONHDL: 136.65
Total CHOL/HDL Ratio: 3
Triglycerides: 109 mg/dL (ref 0.0–149.0)
VLDL: 21.8 mg/dL (ref 0.0–40.0)

## 2017-07-16 LAB — TSH: TSH: 5.42 u[IU]/mL — ABNORMAL HIGH (ref 0.35–4.50)

## 2017-07-16 LAB — VITAMIN D 25 HYDROXY (VIT D DEFICIENCY, FRACTURES): VITD: 55.63 ng/mL (ref 30.00–100.00)

## 2017-07-16 MED ORDER — TRIAMCINOLONE ACETONIDE 55 MCG/ACT NA AERO: 2.0000 | INHALATION_SPRAY | Freq: Every day | NASAL | 3 refills | Status: DC

## 2017-07-16 MED ORDER — METHYLPREDNISOLONE 4 MG PO TBPK: ORAL_TABLET | ORAL | 0 refills | Status: DC

## 2017-07-16 MED ORDER — MOMETASONE FURO-FORMOTEROL FUM 100-5 MCG/ACT IN AERO: 1.0000 | INHALATION_SPRAY | Freq: Every day | RESPIRATORY_TRACT | 1 refills | Status: DC

## 2017-07-16 NOTE — Patient Instructions (Signed)

## 2017-07-16 NOTE — Progress Notes (Signed)
Subjective:  Patient ID: Tracey Boyd, female    DOB: 20-Jul-1951  Age: 66 y.o. MRN: [id number]  CC: Annual Exam; Hypertension; Allergic Rhinitis ; and Hypothyroidism   HPI Tracey Boyd presents for a CPX.  She recently sustained a fall and had a wrist fx that required surgical repair.  She does not know if she has been screened for osteoporosis.  She tells me her asthma has been well controlled recently.  She feels like her thyroid dose is adequate as she has had no recent episodes of fatigue, weight changes, constipation, or diarrhea.  She also tells me her blood pressure has been well controlled and she does not consistently taken amlodipine.  She complains of a several week history of nasal congestion, runny nose, and postnasal drip.  She has tried to control the symptoms with Claritin.  Past Medical History:  Diagnosis Date  . Anemia    as a child  . Anxiety   . Asthma   . Dermatitis herpetiformis   . GERD (gastroesophageal reflux disease)   . HTN (hypertension)   . Hypothyroidism    Past Surgical History:  Procedure Laterality Date  . APPENDECTOMY    . COLONOSCOPY    . CYSTECTOMY      reports that  has never smoked. she has never used smokeless tobacco. She reports that she drinks alcohol. She reports that she does not use drugs. family history includes Alcohol abuse in her father; Colon polyps in her mother; Lung cancer in her mother; Multiple sclerosis in her mother and sister. Allergies  Allergen Reactions  . Ace Inhibitors Cough    Reaction to lisinopril  . Penicillins Hives    Has patient had a PCN reaction causing immediate rash, facial/tongue/throat swelling, SOB or lightheadedness with hypotension: Yes Has patient had a PCN reaction causing severe rash involving mucus membranes or skin necrosis: No Has patient had a PCN reaction that required hospitalization No Has patient had a PCN reaction occurring within the last 10 years: No If all of the above answers  are "NO", then may proceed with Cephalosporin use.  . Sulfa Antibiotics Hives  . Sulfonamide Derivatives Hives    Outpatient Medications Prior to Visit  Medication Sig Dispense Refill  . albuterol (PROAIR HFA) 108 (90 Base) MCG/ACT inhaler Inhale 2 puffs into the lungs every 6 (six) hours as needed. (Patient taking differently: Inhale 2 puffs into the lungs every 6 (six) hours as needed for wheezing or shortness of breath. ) 18 g 1  . Cholecalciferol (VITAMIN D3) 2000 UNITS TABS Take 2,000 Units by mouth daily.     Marland Kitchen levothyroxine (SYNTHROID, LEVOTHROID) 50 MCG tablet Take 50 mcg by mouth daily before breakfast.    . loratadine (CLARITIN) 10 MG tablet Take 10 mg by mouth daily.    . Multiple Vitamin (MULTIVITAMIN WITH MINERALS) TABS tablet Take 1 tablet by mouth daily. Centrum Silver    . omeprazole (PRILOSEC) 40 MG capsule TAKE 1 CAPSULE DAILY 90 capsule 1  . Respiratory Therapy Supplies (FLUTTER) DEVI Use as directed 1 each 0  . amLODipine (NORVASC) 2.5 MG tablet Take 1 tablet (2.5 mg total) by mouth daily. 90 tablet 1  . aspirin EC 81 MG tablet Take 81 mg by mouth daily.    . famotidine (PEPCID) 20 MG tablet One at bedtime 30 tablet 2  . ibuprofen (ADVIL,MOTRIN) 200 MG tablet Take 200 mg by mouth every 6 (six) hours as needed for headache (pain).    . methocarbamol (ROBAXIN)  500 MG tablet Take 1 tablet (500 mg total) by mouth every 6 (six) hours as needed for muscle spasms. 40 tablet 0  . mometasone-formoterol (DULERA) 100-5 MCG/ACT AERO Inhale 1 puff into the lungs daily.     Marland Kitchen HYDROcodone-acetaminophen (NORCO/VICODIN) 5-325 MG tablet Take 1 tablet by mouth every 4 (four) hours as needed. (Patient taking differently: Take 1 tablet by mouth every 4 (four) hours as needed (pain). ) 10 tablet 0  . oxyCODONE (OXY IR/ROXICODONE) 5 MG immediate release tablet Take 1-2 tablets (5-10 mg total) by mouth every 3 (three) hours as needed for moderate pain. 40 tablet 0  . traMADol (ULTRAM) 50 MG tablet  1-2 every 4 hours as needed for cough or pain (Patient not taking: Reported on 11/09/2016) 40 tablet 0   No facility-administered medications prior to visit.     ROS Review of Systems  Constitutional: Negative for appetite change, diaphoresis, fatigue and unexpected weight change.  HENT: Positive for congestion, postnasal drip and rhinorrhea. Negative for facial swelling, nosebleeds, sinus pressure and trouble swallowing.   Eyes: Negative.   Respiratory: Negative.  Negative for cough, chest tightness, shortness of breath and wheezing.   Cardiovascular: Negative.  Negative for chest pain, palpitations and leg swelling.  Gastrointestinal: Negative for abdominal pain, constipation, diarrhea, nausea and vomiting.  Endocrine: Negative.   Genitourinary: Negative.  Negative for difficulty urinating.  Musculoskeletal: Negative.  Negative for arthralgias and myalgias.  Allergic/Immunologic: Negative.   Neurological: Negative.  Negative for dizziness, weakness and headaches.  Hematological: Negative for adenopathy. Does not bruise/bleed easily.  Psychiatric/Behavioral: Negative.     Objective:  BP 126/74 (BP Location: Left Arm, Patient Position: Sitting, Cuff Size: Normal)   Pulse 78   Temp 97.6 F (36.4 C) (Oral)   Resp 16   Ht 5' 4.5" (1.638 m)   Wt 153 lb (69.4 kg)   SpO2 99%   BMI 25.86 kg/m   BP Readings from Last 3 Encounters:  07/16/17 126/74  11/09/16 104/68  11/06/16 131/92    Wt Readings from Last 3 Encounters:  07/16/17 153 lb (69.4 kg)  11/08/16 140 lb (63.5 kg)  09/05/16 149 lb 3.2 oz (67.7 kg)    Physical Exam  Constitutional: She is oriented to person, place, and time. No distress.  HENT:  Nose: Mucosal edema and rhinorrhea present. Epistaxis is observed. Right sinus exhibits no maxillary sinus tenderness and no frontal sinus tenderness. Left sinus exhibits no maxillary sinus tenderness and no frontal sinus tenderness.  Mouth/Throat: Oropharynx is clear and moist.  No oropharyngeal exudate.  Eyes: Conjunctivae are normal. Right eye exhibits no discharge. Left eye exhibits no discharge. No scleral icterus.  Neck: Normal range of motion. Neck supple. No JVD present. No thyromegaly present.  Cardiovascular: Normal rate, regular rhythm and intact distal pulses. Exam reveals no gallop and no friction rub.  No murmur heard. Pulmonary/Chest: Effort normal and breath sounds normal. No respiratory distress. She has no wheezes. She has no rales. She exhibits no tenderness.  Abdominal: Soft. Bowel sounds are normal. She exhibits no distension and no mass. There is no tenderness. There is no rebound and no guarding.  Musculoskeletal: Normal range of motion. She exhibits no edema, tenderness or deformity.  Lymphadenopathy:    She has no cervical adenopathy.  Neurological: She is alert and oriented to person, place, and time.  Skin: Skin is warm and dry. No rash noted. She is not diaphoretic. No erythema. No pallor.  Psychiatric: She has a normal mood and  affect. Her behavior is normal. Judgment and thought content normal.  Vitals reviewed.   Lab Results  Component Value Date   WBC 7.0 07/16/2017   HGB 14.3 07/16/2017   HCT 43.3 07/16/2017   PLT 265.0 07/16/2017   GLUCOSE 91 07/16/2017   CHOL 197 07/16/2017   TRIG 109.0 07/16/2017   HDL 60.30 07/16/2017   LDLCALC 115 (H) 07/16/2017   ALT 13 07/16/2017   AST 20 07/16/2017   NA 141 07/16/2017   K 3.6 07/16/2017   CL 104 07/16/2017   CREATININE 0.84 07/16/2017   BUN 18 07/16/2017   CO2 29 07/16/2017   TSH 5.42 (H) 07/16/2017    No results found.  Assessment & Plan:   Tracey Boyd was seen today for annual exam, hypertension, allergic rhinitis  and hypothyroidism.  Diagnoses and all orders for this visit:  Essential hypertension- Her blood pressure is adequately well controlled on no medications.  Lab work is negative for endorgan damage or secondary causes.  I have asked her to continue to control her  blood pressure with lifestyle modifications. -     Comprehensive metabolic panel; Future -     CBC with Differential/Platelet; Future  Asthma, moderate persistent, well-controlled- Her symptoms are well controlled with Dulera. -     mometasone-formoterol (DULERA) 100-5 MCG/ACT AERO; Inhale 1 puff daily into the lungs. 2 puffs BID  Hyperlipidemia with target LDL less than 130- Her ASCVD risk score is only 5.8% so I do not recommend that she start taking a statin for CV risk reduction. -     Lipid panel; Future  Estrogen deficiency- I have asked her to be screened for osteoporosis. -     DG Bone Density; Future  Age-related osteoporosis with current pathological fracture with routine healing, subsequent encounter- She has achieved a normal vitamin D level.  I have asked her to undergo a DEXA scan does evaluate the severity of her osteoporosis. -     VITAMIN D 25 Hydroxy (Vit-D Deficiency, Fractures); Future -     DG Bone Density; Future  Visit for screening mammogram -     MM DIGITAL SCREENING BILATERAL; Future  Acquired hypothyroidism- her TSH is very slightly elevated but she is asymptomatic.  I have asked her to stay on the current dose of levothyroxine. -     TSH; Future  Routine general medical examination at a health care facility  Seasonal allergic rhinitis due to pollen -     triamcinolone (NASACORT) 55 MCG/ACT AERO nasal inhaler; Place 2 sprays daily into the nose. -     methylPREDNISolone (MEDROL DOSEPAK) 4 MG TBPK tablet; TAKE AS DIRECTED  Other orders -     Pneumococcal polysaccharide vaccine 23-valent greater than or equal to 2yo subcutaneous/IM   I have discontinued Tracey Boyd's traMADol, famotidine, amLODipine, HYDROcodone-acetaminophen, methocarbamol, oxyCODONE, aspirin EC, and ibuprofen. I have also changed her mometasone-formoterol. Additionally, I am having her start on triamcinolone and methylPREDNISolone. Lastly, I am having her maintain her Vitamin D3,  FLUTTER, albuterol, loratadine, levothyroxine, multivitamin with minerals, and omeprazole.  Meds ordered this encounter  Medications  . mometasone-formoterol (DULERA) 100-5 MCG/ACT AERO    Sig: Inhale 1 puff daily into the lungs. 2 puffs BID    Dispense:  3 Inhaler    Refill:  1  . triamcinolone (NASACORT) 55 MCG/ACT AERO nasal inhaler    Sig: Place 2 sprays daily into the nose.    Dispense:  32.4 mL    Refill:  3  .  methylPREDNISolone (MEDROL DOSEPAK) 4 MG TBPK tablet    Sig: TAKE AS DIRECTED    Dispense:  21 tablet    Refill:  0   See AVS for instructions about healthy living and anticipatory guidance.  Follow-up: Return in about 6 months (around 01/13/2018).  Scarlette Calico, MD

## 2017-07-18 NOTE — Assessment & Plan Note (Signed)

## 2017-07-21 ENCOUNTER — Telehealth: Payer: Self-pay

## 2017-07-21 DIAGNOSIS — J4540 Moderate persistent asthma, uncomplicated: Secondary | ICD-10-CM

## 2017-07-21 MED ORDER — MOMETASONE FURO-FORMOTEROL FUM 100-5 MCG/ACT IN AERO: 2.0000 | INHALATION_SPRAY | Freq: Two times a day (BID) | RESPIRATORY_TRACT | 3 refills | Status: DC

## 2017-07-21 NOTE — Telephone Encounter (Signed)
[employer] mail order requested sig clarification for [employer]. New rx with clarification sent to mail order via erx.

## 2017-07-23 ENCOUNTER — Other Ambulatory Visit: Payer: Self-pay | Admitting: Internal Medicine

## 2017-07-23 DIAGNOSIS — J4540 Moderate persistent asthma, uncomplicated: Secondary | ICD-10-CM

## 2017-07-23 MED ORDER — FLUTICASONE FUROATE-VILANTEROL 200-25 MCG/INH IN AEPB: 1.0000 | INHALATION_SPRAY | Freq: Every day | RESPIRATORY_TRACT | 1 refills | Status: DC

## 2017-07-25 ENCOUNTER — Telehealth: Payer: Self-pay

## 2017-07-25 NOTE — Telephone Encounter (Signed)
Key: [facility]

## 2017-07-28 NOTE — Telephone Encounter (Signed)
PA was approved. Will you inform patient of same.

## 2017-07-28 NOTE — Telephone Encounter (Signed)
Patient has been informed.

## 2017-09-05 DIAGNOSIS — Z1231 Encounter for screening mammogram for malignant neoplasm of breast: Secondary | ICD-10-CM | POA: Diagnosis not present

## 2017-09-11 ENCOUNTER — Other Ambulatory Visit: Payer: Self-pay | Admitting: Internal Medicine

## 2017-09-11 DIAGNOSIS — K219 Gastro-esophageal reflux disease without esophagitis: Secondary | ICD-10-CM

## 2018-01-13 DIAGNOSIS — Z825 Family history of asthma and other chronic lower respiratory diseases: Secondary | ICD-10-CM | POA: Diagnosis not present

## 2018-01-13 DIAGNOSIS — R69 Illness, unspecified: Secondary | ICD-10-CM | POA: Diagnosis not present

## 2018-01-13 DIAGNOSIS — K219 Gastro-esophageal reflux disease without esophagitis: Secondary | ICD-10-CM | POA: Diagnosis not present

## 2018-01-13 DIAGNOSIS — Z7982 Long term (current) use of aspirin: Secondary | ICD-10-CM | POA: Diagnosis not present

## 2018-01-13 DIAGNOSIS — J45909 Unspecified asthma, uncomplicated: Secondary | ICD-10-CM | POA: Diagnosis not present

## 2018-01-13 DIAGNOSIS — Z7722 Contact with and (suspected) exposure to environmental tobacco smoke (acute) (chronic): Secondary | ICD-10-CM | POA: Diagnosis not present

## 2018-01-13 DIAGNOSIS — R030 Elevated blood-pressure reading, without diagnosis of hypertension: Secondary | ICD-10-CM | POA: Diagnosis not present

## 2018-01-13 DIAGNOSIS — Z820 Family history of epilepsy and other diseases of the nervous system: Secondary | ICD-10-CM | POA: Diagnosis not present

## 2018-01-13 DIAGNOSIS — Z7951 Long term (current) use of inhaled steroids: Secondary | ICD-10-CM | POA: Diagnosis not present

## 2018-01-13 DIAGNOSIS — E039 Hypothyroidism, unspecified: Secondary | ICD-10-CM | POA: Diagnosis not present

## 2018-02-06 DIAGNOSIS — H2513 Age-related nuclear cataract, bilateral: Secondary | ICD-10-CM | POA: Diagnosis not present

## 2018-02-06 DIAGNOSIS — H04123 Dry eye syndrome of bilateral lacrimal glands: Secondary | ICD-10-CM | POA: Diagnosis not present

## 2018-02-06 DIAGNOSIS — H5203 Hypermetropia, bilateral: Secondary | ICD-10-CM | POA: Diagnosis not present

## 2018-02-16 DIAGNOSIS — H93292 Other abnormal auditory perceptions, left ear: Secondary | ICD-10-CM | POA: Diagnosis not present

## 2018-02-16 DIAGNOSIS — K219 Gastro-esophageal reflux disease without esophagitis: Secondary | ICD-10-CM | POA: Diagnosis not present

## 2018-02-16 DIAGNOSIS — Z888 Allergy status to other drugs, medicaments and biological substances status: Secondary | ICD-10-CM | POA: Diagnosis not present

## 2018-02-16 DIAGNOSIS — R490 Dysphonia: Secondary | ICD-10-CM | POA: Diagnosis not present

## 2018-02-16 DIAGNOSIS — Z880 Allergy status to penicillin: Secondary | ICD-10-CM | POA: Diagnosis not present

## 2018-02-16 DIAGNOSIS — Z79899 Other long term (current) drug therapy: Secondary | ICD-10-CM | POA: Diagnosis not present

## 2018-02-16 DIAGNOSIS — Z882 Allergy status to sulfonamides status: Secondary | ICD-10-CM | POA: Diagnosis not present

## 2018-03-09 DIAGNOSIS — L821 Other seborrheic keratosis: Secondary | ICD-10-CM | POA: Diagnosis not present

## 2018-03-09 DIAGNOSIS — L570 Actinic keratosis: Secondary | ICD-10-CM | POA: Diagnosis not present

## 2018-03-28 ENCOUNTER — Other Ambulatory Visit: Payer: Self-pay | Admitting: Internal Medicine

## 2018-03-28 DIAGNOSIS — K219 Gastro-esophageal reflux disease without esophagitis: Secondary | ICD-10-CM

## 2018-05-18 ENCOUNTER — Telehealth: Payer: Self-pay | Admitting: Internal Medicine

## 2018-05-18 NOTE — Telephone Encounter (Signed)
Copied from [place] [phone]. Topic: Quick Communication - Rx Refill/Question >> May 18, 2018 12:07 PM Oliver Pila B wrote: Medication: dulera  Pt called b/c she loved the samples of the medication she was given before and it was actually the only thing that worked; pt called her insurance to ask for this medication as a regular Rx but they do not cover but she was told that if her pcp contacts insurance the recommendation can place the medication on a certification list of approval for coverage; pt would appreciate if that could be done; contact pt to advise  [phone] Fax: [phone]

## 2018-05-18 NOTE — Telephone Encounter (Signed)
What medication

## 2018-05-21 NOTE — Telephone Encounter (Signed)
PA has been approved. Can you inform pt of same.  

## 2018-05-21 NOTE — Telephone Encounter (Signed)
Pt informed

## 2018-06-03 DIAGNOSIS — R69 Illness, unspecified: Secondary | ICD-10-CM | POA: Diagnosis not present

## 2018-07-06 DIAGNOSIS — Z6827 Body mass index (BMI) 27.0-27.9, adult: Secondary | ICD-10-CM | POA: Diagnosis not present

## 2018-07-06 DIAGNOSIS — N390 Urinary tract infection, site not specified: Secondary | ICD-10-CM | POA: Diagnosis not present

## 2018-07-06 DIAGNOSIS — Z01419 Encounter for gynecological examination (general) (routine) without abnormal findings: Secondary | ICD-10-CM | POA: Diagnosis not present

## 2018-07-21 NOTE — Progress Notes (Addendum)
Subjective:   Tracey Boyd is a 67 y.o. female who presents for Medicare Annual (Subsequent) preventive examination.  Review of Systems:  No ROS.  Medicare Wellness Visit. Additional risk factors are reflected in the social history.  Cardiac Risk Factors include: advanced age (>59men, >21 women);dyslipidemia;hypertension Sleep patterns: feels rested on waking, gets up 1-2 times nightly to void and sleeps 7-8 hours nightly.    Home Safety/Smoke Alarms: Feels safe in home. Smoke alarms in place.  Living environment; residence and Firearm Safety: 2-story house, no firearms. Lives with husband, no needs for DME, good support system Seat Belt Safety/Bike Helmet: Wears seat belt.    Objective:     Vitals: BP 126/62   Pulse 78   Resp 17   Ht 5\' 5"  (1.651 m)   Wt 149 lb (67.6 kg)   SpO2 98%   BMI 24.79 kg/m   Body mass index is 24.79 kg/m.  Advanced Directives 07/22/2018 11/08/2016 03/11/2016  Does Patient Have a Medical Advance Directive? Yes Yes Yes  Type of Paramedic of [place];Living will Living will [place];Living will  Does patient want to make changes to medical advance directive? - No - Patient declined No - Patient declined  Copy of [place] in Chart? No - copy requested - No - copy requested  Would patient like information on creating a medical advance directive? - No - Patient declined -    Tobacco Social History   Tobacco Use  Smoking Status Never Smoker  Smokeless Tobacco Never Used  Tobacco Comment   pt states both parents were smokers     Counseling given: Not Answered Comment: pt states both parents were smokers  Past Medical History:  Diagnosis Date  . Anemia    as a child  . Anxiety   . Asthma   . Dermatitis herpetiformis   . GERD (gastroesophageal reflux disease)   . HTN (hypertension)   . Hypothyroidism    Past Surgical History:  Procedure Laterality Date  . APPENDECTOMY    .  COLONOSCOPY    . CYSTECTOMY    . ORIF WRIST FRACTURE Left 11/08/2016   Procedure: OPEN REDUCTION INTERNAL FIXATION (ORIF) LEFT WRIST FRACTURE;  Surgeon: Roseanne Kaufman, MD;  Location: [facility];  Service: Orthopedics;  Laterality: Left;   Family History  Problem Relation Age of Onset  . Multiple sclerosis Mother   . Lung cancer Mother   . Colon polyps Mother   . Alcohol abuse Father   . Multiple sclerosis Sister   . Colon cancer Neg Hx    Social History   Socioeconomic History  . Marital status: Married    Spouse name: Not on file  . Number of children: 1  . Years of education: Not on file  . Highest education level: Not on file  Occupational History  . Occupation: retired  Scientific laboratory technician  . Financial resource strain: Not hard at all  . Food insecurity:    Worry: Never true    Inability: Never true  . Transportation needs:    Medical: No    Non-medical: No  Tobacco Use  . Smoking status: Never Smoker  . Smokeless tobacco: Never Used  . Tobacco comment: pt states both parents were smokers  Substance and Sexual Activity  . Alcohol use: Yes    Comment: rare  . Drug use: No  . Sexual activity: Not on file  Lifestyle  . Physical activity:    Days per week:  4 days    Minutes per session: 50 min  . Stress: Only a little  Relationships  . Social connections:    Talks on phone: More than three times a week    Gets together: More than three times a week    Attends religious service: More than 4 times per year    Active member of club or organization: Yes    Attends meetings of clubs or organizations: More than 4 times per year    Relationship status: Married  Other Topics Concern  . Not on file  Social History Narrative  . Not on file    Outpatient Encounter Medications as of 07/22/2018  Medication Sig  . albuterol (PROAIR HFA) 108 (90 Base) MCG/ACT inhaler Inhale 2 puffs into the lungs every 6 (six) hours as needed. (Patient taking differently: Inhale 2 puffs into the  lungs every 6 (six) hours as needed for wheezing or shortness of breath. )  . Ascorbic Acid (VITAMIN C ER PO) Take 1 tablet by mouth.  . Levocetirizine Dihydrochloride (XYZAL PO) Take 1 tablet by mouth.  . levothyroxine (SYNTHROID, LEVOTHROID) 50 MCG tablet Take 50 mcg by mouth daily before breakfast.  . mometasone-formoterol (DULERA) 100-5 MCG/ACT AERO Inhale 2 puffs 2 (two) times daily into the lungs.  . Multiple Vitamin (MULTIVITAMIN WITH MINERALS) TABS tablet Take 1 tablet by mouth daily. Centrum Silver  . Omega-3 Fatty Acids (FISH OIL CONCENTRATE PO) Take 1 tablet by mouth.  Marland Kitchen omeprazole (PRILOSEC) 40 MG capsule TAKE 1 CAPSULE DAILY  . Propylene Glycol (SYSTANE BALANCE OP) Apply 1 drop to eye.  Marland Kitchen Respiratory Therapy Supplies (FLUTTER) DEVI Use as directed  . [DISCONTINUED] Cholecalciferol (VITAMIN D3) 2000 UNITS TABS Take 2,000 Units by mouth daily.   . [DISCONTINUED] fluticasone furoate-vilanterol (BREO ELLIPTA) 200-25 MCG/INH AEPB Inhale 1 puff daily into the lungs. (Patient not taking: Reported on 07/22/2018)  . [DISCONTINUED] loratadine (CLARITIN) 10 MG tablet Take 10 mg by mouth daily.  . [DISCONTINUED] omeprazole (PRILOSEC) 40 MG capsule TAKE 1 CAPSULE DAILY (Patient not taking: Reported on 07/22/2018)  . [DISCONTINUED] triamcinolone (NASACORT) 55 MCG/ACT AERO nasal inhaler Place 2 sprays daily into the nose. (Patient not taking: Reported on 07/22/2018)   No facility-administered encounter medications on file as of 07/22/2018.     Activities of Daily Living In your present state of health, do you have any difficulty performing the following activities: 07/22/2018  Hearing? N  Vision? N  Difficulty concentrating or making decisions? N  Walking or climbing stairs? N  Dressing or bathing? N  Doing errands, shopping? N  Preparing Food and eating ? N  Using the Toilet? N  In the past six months, have you accidently leaked urine? N  Do you have problems with loss of bowel control?  N  Managing your Medications? N  Managing your Finances? N  Housekeeping or managing your Housekeeping? N  Some recent data might be hidden    Patient Care Team: Janith Lima, MD as PCP - General (Internal Medicine) Arvella Nigh, MD as Consulting Physician (Obstetrics and Gynecology)    Assessment:   This is a routine wellness examination for [place]. Physical assessment deferred to PCP.   Exercise Activities and Dietary recommendations Current Exercise Habits: Home exercise routine;Structured exercise class, Type of exercise: walking;strength training/weights;calisthenics, Time (Minutes): 50, Frequency (Times/Week): 4, Weekly Exercise (Minutes/Week): 200, Intensity: Mild  Diet (meal preparation, eat out, water intake, caffeinated beverages, dairy products, fruits and vegetables): in general, a "healthy" diet  ,  well balanced. eats a variety of fruits and vegetables daily, limits salt, fat/cholesterol, sugar,carbohydrates,caffeine, drinks 6-8 glasses of water daily.  Goals    . Patient Stated     Continue to exercise and eat healthy. I would like to start to do yoga to maintain strength in my core and use weights to strengthen my arms.        Fall Risk Fall Risk  07/22/2018 07/18/2017 07/16/2017 03/11/2016 03/07/2016  Falls in the past year? 1 No No No No  Number falls in past yr: 0 - - - -  Injury with Fall? 1 - - - -   Depression Screen PHQ 2/9 Scores 07/22/2018 07/18/2017 07/16/2017 03/11/2016  PHQ - 2 Score 1 0 0 0  PHQ- 9 Score 2 - 1 -     Cognitive Function       Ad8 score reviewed for issues:  Issues making decisions: no  Less interest in hobbies / activities: no  Repeats questions, stories (family complaining): no  Trouble using ordinary gadgets (microwave, computer, phone):no  Forgets the month or year: no  Mismanaging finances: no  Remembering appts: no  Daily problems with thinking and/or memory: no Ad8 score is= 0  Immunization History    Administered Date(s) Administered  . Influenza Inj Mdck Quad Pf 07/04/2017  . Influenza Split 07/10/2012  . Influenza Whole 08/31/2010, 05/20/2011  . Influenza, High Dose Seasonal PF 06/09/2018  . Influenza,inj,Quad PF,6+ Mos 07/22/2013, 08/25/2014  . Pneumococcal Conjugate-13 03/07/2016  . Pneumococcal Polysaccharide-23 07/16/2017  . Td 08/31/2010  . Zoster 11/01/2011   Screening Tests Health Maintenance  Topic Date Due  . DEXA SCAN  03/02/2016  . MAMMOGRAM  05/07/2017  . TETANUS/TDAP  08/31/2020  . COLONOSCOPY  11/07/2021  . INFLUENZA VACCINE  Completed  . Hepatitis C Screening  Completed  . PNA vac Low Risk Adult  Completed      Plan:   Continue doing brain stimulating activities (puzzles, reading, adult coloring books, staying active) to keep memory sharp.   Continue to eat heart healthy diet (full of fruits, vegetables, whole grains, lean protein, water--limit salt, fat, and sugar intake) and increase physical activity as tolerated.  I have personally reviewed and noted the following in the patient's chart:   . Medical and social history . Use of alcohol, tobacco or illicit drugs  . Current medications and supplements . Functional ability and status . Nutritional status . Physical activity . Advanced directives . List of other physicians . Vitals . Screenings to include cognitive, depression, and falls . Referrals and appointments  In addition, I have reviewed and discussed with patient certain preventive protocols, quality metrics, and best practice recommendations. A written personalized care plan for preventive services as well as general preventive health recommendations were provided to patient.     Michiel Cowboy, RN  07/22/2018    Medical screening examination/treatment/procedure(s) were performed by non-physician practitioner and as supervising physician I was immediately available for consultation/collaboration. I agree with above. Scarlette Calico, MD

## 2018-07-22 ENCOUNTER — Ambulatory Visit (INDEPENDENT_AMBULATORY_CARE_PROVIDER_SITE_OTHER): Payer: Medicare HMO | Admitting: *Deleted

## 2018-07-22 VITALS — BP 126/62 | HR 78 | Resp 17 | Ht 65.0 in | Wt 149.0 lb

## 2018-07-22 DIAGNOSIS — Z0000 Encounter for general adult medical examination without abnormal findings: Secondary | ICD-10-CM | POA: Diagnosis not present

## 2018-07-22 DIAGNOSIS — E2839 Other primary ovarian failure: Secondary | ICD-10-CM | POA: Diagnosis not present

## 2018-07-22 DIAGNOSIS — Z1231 Encounter for screening mammogram for malignant neoplasm of breast: Secondary | ICD-10-CM

## 2018-07-22 NOTE — Patient Instructions (Addendum)
Continue doing brain stimulating activities (puzzles, reading, adult coloring books, staying active) to keep memory sharp.   Continue to eat heart healthy diet (full of fruits, vegetables, whole grains, lean protein, water--limit salt, fat, and sugar intake) and increase physical activity as tolerated.   Tracey Boyd , Thank you for taking time to come for your Medicare Wellness Visit. I appreciate your ongoing commitment to your health goals. Please review the following plan we discussed and let me know if I can assist you in the future.   These are the goals we discussed: Goals    . Patient Stated     Continue to exercise and eat healthy. I would like to start to do yoga to maintain strength in my core and use weights to strengthen my arms.        This is a list of the screening recommended for you and due dates:  Health Maintenance  Topic Date Due  . DEXA scan (bone density measurement)  03/02/2016  . Mammogram  05/07/2017  . Tetanus Vaccine  08/31/2020  . Colon Cancer Screening  11/07/2021  . Flu Shot  Completed  .  Hepatitis C: One time screening is recommended by Center for Disease Control  (CDC) for  adults born from [birth year] through 1965.   Completed  . Pneumonia vaccines  Completed   Health Maintenance, Female Adopting a healthy lifestyle and getting preventive care can go a long way to promote health and wellness. Talk with your health care provider about what schedule of regular examinations is right for you. This is a good chance for you to check in with your provider about disease prevention and staying healthy. In between checkups, there are plenty of things you can do on your own. Experts have done a lot of research about which lifestyle changes and preventive measures are most likely to keep you healthy. Ask your health care provider for more information. Weight and diet Eat a healthy diet  Be sure to include plenty of vegetables, fruits, low-fat dairy products, and lean  protein.  Do not eat a lot of foods high in solid fats, added sugars, or salt.  Get regular exercise. This is one of the most important things you can do for your health. ? Most adults should exercise for at least 150 minutes each week. The exercise should increase your heart rate and make you sweat (moderate-intensity exercise). ? Most adults should also do strengthening exercises at least twice a week. This is in addition to the moderate-intensity exercise.  Maintain a healthy weight  Body mass index (BMI) is a measurement that can be used to identify possible weight problems. It estimates body fat based on height and weight. Your health care provider can help determine your BMI and help you achieve or maintain a healthy weight.  For females 84 years of age and older: ? A BMI below 18.5 is considered underweight. ? A BMI of 18.5 to 24.9 is normal. ? A BMI of 25 to 29.9 is considered overweight. ? A BMI of 30 and above is considered obese.  Watch levels of cholesterol and blood lipids  You should start having your blood tested for lipids and cholesterol at 67 years of age, then have this test every 5 years.  You may need to have your cholesterol levels checked more often if: ? Your lipid or cholesterol levels are high. ? You are older than 67 years of age. ? You are at high risk for heart disease.  Cancer screening Lung Cancer  Lung cancer screening is recommended for adults 47-31 years old who are at high risk for lung cancer because of a history of smoking.  A yearly low-dose CT scan of the lungs is recommended for people who: ? Currently smoke. ? Have quit within the past 15 years. ? Have at least a 30-pack-year history of smoking. A pack year is smoking an average of one pack of cigarettes a day for 1 year.  Yearly screening should continue until it has been 15 years since you quit.  Yearly screening should stop if you develop a health problem that would prevent you from  having lung cancer treatment.  Breast Cancer  Practice breast self-awareness. This means understanding how your breasts normally appear and feel.  It also means doing regular breast self-exams. Let your health care provider know about any changes, no matter how small.  If you are in your 20s or 30s, you should have a clinical breast exam (CBE) by a health care provider every 1-3 years as part of a regular health exam.  If you are 52 or older, have a CBE every year. Also consider having a breast X-ray (mammogram) every year.  If you have a family history of breast cancer, talk to your health care provider about genetic screening.  If you are at high risk for breast cancer, talk to your health care provider about having an MRI and a mammogram every year.  Breast cancer gene (BRCA) assessment is recommended for women who have family members with BRCA-related cancers. BRCA-related cancers include: ? Breast. ? Ovarian. ? Tubal. ? Peritoneal cancers.  Results of the assessment will determine the need for genetic counseling and BRCA1 and BRCA2 testing.  Cervical Cancer Your health care provider may recommend that you be screened regularly for cancer of the pelvic organs (ovaries, uterus, and vagina). This screening involves a pelvic examination, including checking for microscopic changes to the surface of your cervix (Pap test). You may be encouraged to have this screening done every 3 years, beginning at age 41.  For women ages 37-65, health care providers may recommend pelvic exams and Pap testing every 3 years, or they may recommend the Pap and pelvic exam, combined with testing for human papilloma virus (HPV), every 5 years. Some types of HPV increase your risk of cervical cancer. Testing for HPV may also be done on women of any age with unclear Pap test results.  Other health care providers may not recommend any screening for nonpregnant women who are considered low risk for pelvic cancer  and who do not have symptoms. Ask your health care provider if a screening pelvic exam is right for you.  If you have had past treatment for cervical cancer or a condition that could lead to cancer, you need Pap tests and screening for cancer for at least 20 years after your treatment. If Pap tests have been discontinued, your risk factors (such as having a new sexual partner) need to be reassessed to determine if screening should resume. Some women have medical problems that increase the chance of getting cervical cancer. In these cases, your health care provider may recommend more frequent screening and Pap tests.  Colorectal Cancer  This type of cancer can be detected and often prevented.  Routine colorectal cancer screening usually begins at 67 years of age and continues through 67 years of age.  Your health care provider may recommend screening at an earlier age if you have risk factors  for colon cancer.  Your health care provider may also recommend using home test kits to check for hidden blood in the stool.  A small camera at the end of a tube can be used to examine your colon directly (sigmoidoscopy or colonoscopy). This is done to check for the earliest forms of colorectal cancer.  Routine screening usually begins at age 42.  Direct examination of the colon should be repeated every 5-10 years through 67 years of age. However, you may need to be screened more often if early forms of precancerous polyps or small growths are found.  Skin Cancer  Check your skin from head to toe regularly.  Tell your health care provider about any new moles or changes in moles, especially if there is a change in a mole's shape or color.  Also tell your health care provider if you have a mole that is larger than the size of a pencil eraser.  Always use sunscreen. Apply sunscreen liberally and repeatedly throughout the day.  Protect yourself by wearing long sleeves, pants, a wide-brimmed hat, and  sunglasses whenever you are outside.  Heart disease, diabetes, and high blood pressure  High blood pressure causes heart disease and increases the risk of stroke. High blood pressure is more likely to develop in: ? People who have blood pressure in the high end of the normal range (130-139/85-89 mm Hg). ? People who are overweight or obese. ? People who are African American.  If you are 65-43 years of age, have your blood pressure checked every 3-5 years. If you are 10 years of age or older, have your blood pressure checked every year. You should have your blood pressure measured twice-once when you are at a hospital or clinic, and once when you are not at a hospital or clinic. Record the average of the two measurements. To check your blood pressure when you are not at a hospital or clinic, you can use: ? An automated blood pressure machine at a pharmacy. ? A home blood pressure monitor.  If you are between 36 years and 71 years old, ask your health care provider if you should take aspirin to prevent strokes.  Have regular diabetes screenings. This involves taking a blood sample to check your fasting blood sugar level. ? If you are at a normal weight and have a low risk for diabetes, have this test once every three years after 68 years of age. ? If you are overweight and have a high risk for diabetes, consider being tested at a younger age or more often. Preventing infection Hepatitis B  If you have a higher risk for hepatitis B, you should be screened for this virus. You are considered at high risk for hepatitis B if: ? You were born in a country where hepatitis B is common. Ask your health care provider which countries are considered high risk. ? Your parents were born in a high-risk country, and you have not been immunized against hepatitis B (hepatitis B vaccine). ? You have HIV or AIDS. ? You use needles to inject street drugs. ? You live with someone who has hepatitis B. ? You have  had sex with someone who has hepatitis B. ? You get hemodialysis treatment. ? You take certain medicines for conditions, including cancer, organ transplantation, and autoimmune conditions.  Hepatitis C  Blood testing is recommended for: ? Everyone born from [birth year] through 1965. ? Anyone with known risk factors for hepatitis C.  Sexually transmitted infections (STIs)  You should be screened for sexually transmitted infections (STIs) including gonorrhea and chlamydia if: ? You are sexually active and are younger than 67 years of age. ? You are older than 67 years of age and your health care provider tells you that you are at risk for this type of infection. ? Your sexual activity has changed since you were last screened and you are at an increased risk for chlamydia or gonorrhea. Ask your health care provider if you are at risk.  If you do not have HIV, but are at risk, it may be recommended that you take a prescription medicine daily to prevent HIV infection. This is called pre-exposure prophylaxis (PrEP). You are considered at risk if: ? You are sexually active and do not regularly use condoms or know the HIV status of your partner(s). ? You take drugs by injection. ? You are sexually active with a partner who has HIV.  Talk with your health care provider about whether you are at high risk of being infected with HIV. If you choose to begin PrEP, you should first be tested for HIV. You should then be tested every 3 months for as long as you are taking PrEP. Pregnancy  If you are premenopausal and you may become pregnant, ask your health care provider about preconception counseling.  If you may become pregnant, take 400 to 800 micrograms (mcg) of folic acid every day.  If you want to prevent pregnancy, talk to your health care provider about birth control (contraception). Osteoporosis and menopause  Osteoporosis is a disease in which the bones lose minerals and strength with aging. This  can result in serious bone fractures. Your risk for osteoporosis can be identified using a bone density scan.  If you are 6 years of age or older, or if you are at risk for osteoporosis and fractures, ask your health care provider if you should be screened.  Ask your health care provider whether you should take a calcium or vitamin D supplement to lower your risk for osteoporosis.  Menopause may have certain physical symptoms and risks.  Hormone replacement therapy may reduce some of these symptoms and risks. Talk to your health care provider about whether hormone replacement therapy is right for you. Follow these instructions at home:  Schedule regular health, dental, and eye exams.  Stay current with your immunizations.  Do not use any tobacco products including cigarettes, chewing tobacco, or electronic cigarettes.  If you are pregnant, do not drink alcohol.  If you are breastfeeding, limit how much and how often you drink alcohol.  Limit alcohol intake to no more than 1 drink per day for nonpregnant women. One drink equals 12 ounces of beer, 5 ounces of Rande Dario, or 1 ounces of hard liquor.  Do not use street drugs.  Do not share needles.  Ask your health care provider for help if you need support or information about quitting drugs.  Tell your health care provider if you often feel depressed.  Tell your health care provider if you have ever been abused or do not feel safe at home. This information is not intended to replace advice given to you by your health care provider. Make sure you discuss any questions you have with your health care provider. Document Released: 03/11/2011 Document Revised: 02/01/2016 Document Reviewed: 05/30/2015 Elsevier Interactive Patient Education  [employer].

## 2018-08-24 DIAGNOSIS — R69 Illness, unspecified: Secondary | ICD-10-CM | POA: Diagnosis not present

## 2018-09-05 ENCOUNTER — Ambulatory Visit (HOSPITAL_COMMUNITY)
Admission: EM | Admit: 2018-09-05 | Discharge: 2018-09-05 | Disposition: A | Discharge status: Home / Self Care | Payer: Medicare HMO | Attending: Family Medicine | Admitting: Family Medicine

## 2018-09-05 ENCOUNTER — Other Ambulatory Visit: Payer: Self-pay

## 2018-09-05 ENCOUNTER — Encounter (HOSPITAL_COMMUNITY): Payer: Self-pay

## 2018-09-05 DIAGNOSIS — J069 Acute upper respiratory infection, unspecified: Secondary | ICD-10-CM | POA: Diagnosis not present

## 2018-09-05 DIAGNOSIS — B9789 Other viral agents as the cause of diseases classified elsewhere: Secondary | ICD-10-CM | POA: Diagnosis not present

## 2018-09-05 DIAGNOSIS — R062 Wheezing: Secondary | ICD-10-CM

## 2018-09-05 MED ORDER — HYDROCODONE-HOMATROPINE 5-1.5 MG/5ML PO SYRP: 5.0000 mL | ORAL_SOLUTION | Freq: Four times a day (QID) | ORAL | 0 refills | Status: DC | PRN

## 2018-09-05 MED ORDER — PREDNISONE 10 MG (21) PO TBPK: ORAL_TABLET | Freq: Every day | ORAL | 0 refills | Status: DC

## 2018-09-05 NOTE — ED Provider Notes (Signed)
[place]   [id number] 09/05/18 Arrival Time: 3614  ASSESSMENT & PLAN:  1. Viral URI with cough   2. Wheezing    See AVS for D/C instructions. No sign of bacterial illness such as pneumonia. No indication for chest imaging at this time. Discussed.  Meds ordered this encounter  Medications  . HYDROcodone-homatropine (HYCODAN) 5-1.5 MG/5ML syrup    Sig: Take 5 mLs by mouth every 6 (six) hours as needed for cough.    Dispense:  90 mL    Refill:  0  . predniSONE (STERAPRED UNI-PAK 21 TAB) 10 MG (21) TBPK tablet    Sig: Take by mouth daily. Take as directed.    Dispense:  21 tablet    Refill:  0   Continue albuterol inhaler as needed. Cough medication sedation precautions. Discussed typical duration of symptoms. OTC symptom care as needed. Ensure adequate fluid intake and rest.   Follow-up Information    Janith Lima, MD.   Specialty:  Internal Medicine Why:  As needed. Contact information: [address] [place] [address] [phone]        [facility].   Specialty:  Urgent Care Why:  If symptoms worsen. Contact information: [place] [phone]         Reviewed expectations re: course of current medical issues. Questions answered. Outlined signs and symptoms indicating need for more acute intervention. Patient verbalized understanding. After Visit Summary given.   SUBJECTIVE: History from: patient.  Tracey Boyd is a 67 y.o. female who presents with complaint of nasal congestion, post-nasal drainage, and a persistent dry cough; without sore throat. Onset abrupt, 5-6 days ago. Overall with fatigue and with mild body aches. SOB: none. Wheezing: sporadic; relieved with albuterol inhaler. No back pain. Ambulatory without difficulty. Fever: none reported/suspected. Overall normal PO intake without n/v. Sick contacts: yes, family members with similar  illness/symptoms. No specific or significant aggravating or alleviating factors reported. OTC treatment: none reported.   Immunization History  Administered Date(s) Administered  . Influenza Inj Mdck Quad Pf 07/04/2017  . Influenza Split 07/10/2012  . Influenza Whole 08/31/2010, 05/20/2011  . Influenza, High Dose Seasonal PF 06/09/2018  . Influenza,inj,Quad PF,6+ Mos 07/22/2013, 08/25/2014  . Pneumococcal Conjugate-13 03/07/2016  . Pneumococcal Polysaccharide-23 07/16/2017  . Td 08/31/2010  . Zoster 11/01/2011   Received flu shot this year: yes.  Social History   Tobacco Use  Smoking Status Never Smoker  Smokeless Tobacco Never Used  Tobacco Comment   pt states both parents were smokers    ROS: As per HPI. All other systems negative.    OBJECTIVE:  Vitals:   09/05/18 1210 09/05/18 1214  BP:  125/90  Pulse:  (!) 107  Resp:  18  Temp:  99.1 F (37.3 C)  TempSrc:  Oral  SpO2:  91%  Weight: 68.9 kg      General appearance: alert; appears fatigued HEENT: nasal congestion; clear runny nose; throat irritation secondary to post-nasal drainage Neck: supple without LAD CV: RRR Lungs: unlabored respirations, symmetrical air entry with bilateral mild expiratory wheezing; cough: moderate Abd: soft Ext: no LE edema Skin: warm and dry Psychological: alert and cooperative; normal mood and affect   Allergies  Allergen Reactions  . Ace Inhibitors Cough    Reaction to lisinopril  . Penicillins Hives    Has patient had a PCN reaction causing immediate rash, facial/tongue/throat swelling, SOB or lightheadedness with hypotension: Yes  Has patient had a PCN reaction causing severe rash involving mucus membranes or skin necrosis: No Has patient had a PCN reaction that required hospitalization No Has patient had a PCN reaction occurring within the last 10 years: No If all of the above answers are "NO", then may proceed with Cephalosporin use.  . Sulfa Antibiotics Hives  .  Sulfonamide Derivatives Hives    Past Medical History:  Diagnosis Date  . Anemia    as a child  . Anxiety   . Asthma   . Dermatitis herpetiformis   . GERD (gastroesophageal reflux disease)   . HTN (hypertension)   . Hypothyroidism    Family History  Problem Relation Age of Onset  . Multiple sclerosis Mother   . Lung cancer Mother   . Colon polyps Mother   . Alcohol abuse Father   . Multiple sclerosis Sister   . Colon cancer Neg Hx    Social History   Socioeconomic History  . Marital status: Married    Spouse name: Not on file  . Number of children: 1  . Years of education: Not on file  . Highest education level: Not on file  Occupational History  . Occupation: retired  Scientific laboratory technician  . Financial resource strain: Not hard at all  . Food insecurity:    Worry: Never true    Inability: Never true  . Transportation needs:    Medical: No    Non-medical: No  Tobacco Use  . Smoking status: Never Smoker  . Smokeless tobacco: Never Used  . Tobacco comment: pt states both parents were smokers  Substance and Sexual Activity  . Alcohol use: Yes    Comment: rare  . Drug use: No  . Sexual activity: Not on file  Lifestyle  . Physical activity:    Days per week: 4 days    Minutes per session: 50 min  . Stress: Only a little  Relationships  . Social connections:    Talks on phone: More than three times a week    Gets together: More than three times a week    Attends religious service: More than 4 times per year    Active member of club or organization: Yes    Attends meetings of clubs or organizations: More than 4 times per year    Relationship status: Married  . Intimate partner violence:    Fear of current or ex partner: No    Emotionally abused: No    Physically abused: No    Forced sexual activity: No  Other Topics Concern  . Not on file  Social History Narrative  . Not on file           Vanessa Kick, MD 09/05/18 1246

## 2018-09-05 NOTE — Discharge Instructions (Addendum)

## 2018-09-05 NOTE — ED Triage Notes (Signed)
Pt cc coughing x 6 days. Coughing up thick flam.

## 2018-09-07 DIAGNOSIS — Z1231 Encounter for screening mammogram for malignant neoplasm of breast: Secondary | ICD-10-CM | POA: Diagnosis not present

## 2018-09-07 LAB — HM MAMMOGRAPHY

## 2018-09-10 ENCOUNTER — Telehealth: Payer: Self-pay | Admitting: Internal Medicine

## 2018-09-10 NOTE — Telephone Encounter (Signed)
Pt needs an appt and possibly a chest xray. Can you call and schedule please.

## 2018-09-10 NOTE — Telephone Encounter (Signed)
Patient is scheduled for 1/3 with schmitz

## 2018-09-10 NOTE — Telephone Encounter (Signed)
Copied from [place] [id number]. Topic: Quick Communication - See Telephone Encounter >> Sep 10, 2018 10:40 AM Rutherford Nail, NT wrote: CRM for notification. See Telephone encounter for: 09/10/18. Patient calling and states that she was seen at [facility] on 09/05/18. States that she was given a cough syrup and prednisone and she has completed the prednisone and is still having the aggressive cough. States that it is productive- gray in color. Would like to know what Dr Ronnald Ramp would like for her to do or if he could send medication to the pharmacy? Please advise. Center, [place] [facility] [place]

## 2018-09-11 ENCOUNTER — Telehealth: Payer: Self-pay | Admitting: Family Medicine

## 2018-09-11 ENCOUNTER — Ambulatory Visit (INDEPENDENT_AMBULATORY_CARE_PROVIDER_SITE_OTHER): Payer: Medicare HMO | Admitting: Family Medicine

## 2018-09-11 ENCOUNTER — Ambulatory Visit (INDEPENDENT_AMBULATORY_CARE_PROVIDER_SITE_OTHER)
Admission: RE | Admit: 2018-09-11 | Discharge: 2018-09-11 | Disposition: A | Discharge status: Home / Self Care | Payer: Medicare HMO | Source: Ambulatory Visit | Attending: Family Medicine | Admitting: Family Medicine

## 2018-09-11 ENCOUNTER — Encounter: Payer: Self-pay | Admitting: Family Medicine

## 2018-09-11 VITALS — BP 138/88 | HR 93 | Temp 98.3°F | Resp 16 | Wt 146.0 lb

## 2018-09-11 DIAGNOSIS — J4540 Moderate persistent asthma, uncomplicated: Secondary | ICD-10-CM

## 2018-09-11 DIAGNOSIS — R059 Cough, unspecified: Secondary | ICD-10-CM

## 2018-09-11 DIAGNOSIS — R05 Cough: Secondary | ICD-10-CM

## 2018-09-11 MED ORDER — METHYLPREDNISOLONE ACETATE 80 MG/ML IJ SUSP
80.0000 mg | Freq: Once | INTRAMUSCULAR | Status: AC
  Administered 2018-09-11: 80 mg via INTRAMUSCULAR

## 2018-09-11 MED ORDER — AZITHROMYCIN 250 MG PO TABS: ORAL_TABLET | ORAL | 0 refills | Status: DC

## 2018-09-11 NOTE — Patient Instructions (Signed)
Good to see you  Please schedule the albuterol inhaler every 4-6 hours for the next day  Take the azithromycin if the symptoms are not improving.  Please try things such as zyrtec-D or allegra-D which is an antihistamine and decongestant.  Please try afrin which will help with nasal congestion but use for only three days.  Please also try using a netti pot on a regular occasion. Please try honey, vick's vapor rub, lozenges and humidifer for cough and sore throat  Please see [place] back if your symptoms last longer than 10 days.

## 2018-09-11 NOTE — Assessment & Plan Note (Signed)
Likely viral in nature.  - chest xray  - provided azithro if no improvement.

## 2018-09-11 NOTE — Assessment & Plan Note (Signed)
May have a mild exacerbation with her ongoing cough at night.  - IM depo  - schedule albuterol for next 24-48 hours.

## 2018-09-11 NOTE — Progress Notes (Signed)
Tracey Boyd - 68 y.o. female MRN [id number]  Date of birth: Jun 08, 1951  SUBJECTIVE:  Including CC & ROS.  No chief complaint on file.   Tracey Boyd is a 68 y.o. female that is presenting with ongoing cough, sinus congestion, malaise.  She has had sick contacts.  She was seen in the urgent care and provided prednisone and Hycodan.  She feels like her symptoms are gotten worse.  She is around her grandchild that is immunocompromise.  She has a history of asthma.  Her symptoms seem to be worse at night.  Has not been using her inhaler on a regular basis.   Review of Systems  Constitutional: Positive for activity change. Negative for fever.  HENT: Positive for congestion and sinus pressure.   Respiratory: Positive for cough.   Cardiovascular: Negative for chest pain.  Gastrointestinal: Negative for abdominal pain.  Musculoskeletal: Negative for back pain.  Skin: Negative for color change.  Neurological: Negative for weakness.  Hematological: Negative for adenopathy.  Psychiatric/Behavioral: Negative for agitation.    HISTORY: Past Medical, Surgical, Social, and Family History Reviewed & Updated per EMR.   Pertinent Historical Findings include:  Past Medical History:  Diagnosis Date  . Anemia    as a child  . Anxiety   . Asthma   . Dermatitis herpetiformis   . GERD (gastroesophageal reflux disease)   . HTN (hypertension)   . Hypothyroidism     Past Surgical History:  Procedure Laterality Date  . APPENDECTOMY    . COLONOSCOPY    . CYSTECTOMY    . ORIF WRIST FRACTURE Left 11/08/2016   Procedure: OPEN REDUCTION INTERNAL FIXATION (ORIF) LEFT WRIST FRACTURE;  Surgeon: Roseanne Kaufman, MD;  Location: [facility];  Service: Orthopedics;  Laterality: Left;    Allergies  Allergen Reactions  . Ace Inhibitors Cough    Reaction to lisinopril  . Penicillins Hives    Has patient had a PCN reaction causing immediate rash, facial/tongue/throat swelling, SOB or lightheadedness with  hypotension: Yes Has patient had a PCN reaction causing severe rash involving mucus membranes or skin necrosis: No Has patient had a PCN reaction that required hospitalization No Has patient had a PCN reaction occurring within the last 10 years: No If all of the above answers are "NO", then may proceed with Cephalosporin use.  . Sulfa Antibiotics Hives  . Sulfonamide Derivatives Hives    Family History  Problem Relation Age of Onset  . Multiple sclerosis Mother   . Lung cancer Mother   . Colon polyps Mother   . Alcohol abuse Father   . Multiple sclerosis Sister   . Colon cancer Neg Hx      Social History   Socioeconomic History  . Marital status: Married    Spouse name: Not on file  . Number of children: 1  . Years of education: Not on file  . Highest education level: Not on file  Occupational History  . Occupation: retired  Scientific laboratory technician  . Financial resource strain: Not hard at all  . Food insecurity:    Worry: Never true    Inability: Never true  . Transportation needs:    Medical: No    Non-medical: No  Tobacco Use  . Smoking status: Never Smoker  . Smokeless tobacco: Never Used  . Tobacco comment: pt states both parents were smokers  Substance and Sexual Activity  . Alcohol use: Yes    Comment: rare  . Drug use: No  . Sexual activity: Not on  file  Lifestyle  . Physical activity:    Days per week: 4 days    Minutes per session: 50 min  . Stress: Only a little  Relationships  . Social connections:    Talks on phone: More than three times a week    Gets together: More than three times a week    Attends religious service: More than 4 times per year    Active member of club or organization: Yes    Attends meetings of clubs or organizations: More than 4 times per year    Relationship status: Married  . Intimate partner violence:    Fear of current or ex partner: No    Emotionally abused: No    Physically abused: No    Forced sexual activity: No  Other  Topics Concern  . Not on file  Social History Narrative  . Not on file     PHYSICAL EXAM:  VS: BP 138/88   Pulse 93   Temp 98.3 F (36.8 C) (Oral)   Resp 16   Wt 146 lb (66.2 kg)   SpO2 97%   BMI 24.30 kg/m  Physical Exam Gen: NAD, alert, cooperative with exam, ENT: normal lips, normal nasal mucosa, normal TM b/l, normal oropharynx ,  Eye: normal EOM, normal conjunctiva and lids CV:  no edema, +2 pedal pulses, regular rhythm    Resp: no accessory muscle use, non-labored, mild end expiratory wheezing  Skin: no rashes, no areas of induration  Neuro: normal tone, normal sensation to touch Psych:  normal insight, alert and oriented MSK: normal gait, normal strength       ASSESSMENT & PLAN:   Asthma, moderate persistent, well-controlled May have a mild exacerbation with her ongoing cough at night.  - IM depo  - schedule albuterol for next 24-48 hours.   Cough Likely viral in nature.  - chest xray  - provided azithro if no improvement.

## 2018-09-11 NOTE — Telephone Encounter (Signed)
Left VM for patient. If she calls back please have her speak with a nurse/CMA and inform that her chest xray was normal. The [facility] can report results to patient.   If any questions then please take the best time and phone number to call and I will try to call her back.   Rosemarie Ax, MD [facility] 09/11/2018, 5:11 PM

## 2018-09-17 ENCOUNTER — Other Ambulatory Visit: Payer: Self-pay | Admitting: Internal Medicine

## 2018-09-17 DIAGNOSIS — K219 Gastro-esophageal reflux disease without esophagitis: Secondary | ICD-10-CM

## 2018-12-14 ENCOUNTER — Other Ambulatory Visit: Payer: Self-pay | Admitting: Internal Medicine

## 2018-12-14 DIAGNOSIS — J4521 Mild intermittent asthma with (acute) exacerbation: Secondary | ICD-10-CM

## 2018-12-14 NOTE — Telephone Encounter (Signed)
Requested medication (s) are due for refill today: no  Requested medication (s) are on the active medication list: no  Last refill:  05/17/16 by a different provider  Future visit scheduled: yes  Notes to clinic:  Unable to refill per protocol. Last prescription in 2017 and was written by a different provider    Requested Prescriptions  Pending Prescriptions Disp Refills   albuterol (PROAIR HFA) 108 (90 Base) MCG/ACT inhaler 18 g 1    Sig: Inhale 2 puffs into the lungs every 6 (six) hours as needed.     Pulmonology:  Beta Agonists Failed - 12/14/2018  5:33 PM      Failed - One inhaler should last at least one month. If the patient is requesting refills earlier, contact the patient to check for uncontrolled symptoms.      Passed - Valid encounter within last 12 months    Recent Outpatient Visits          3 months ago Cough   [facility], Enid Baas, MD   1 year ago Essential hypertension   [place], Thomas L, MD   2 years ago Asthma with exacerbation, mild intermittent   [facility] [place], Malka So, MD   2 years ago Essential hypertension   [place], Thomas L, MD   4 years ago Essential hypertension   [place], Thomas L, MD      Future Appointments            In 7 months [place], [facility]

## 2018-12-15 MED ORDER — ALBUTEROL SULFATE HFA 108 (90 BASE) MCG/ACT IN AERS: 2.0000 | INHALATION_SPRAY | Freq: Four times a day (QID) | RESPIRATORY_TRACT | 3 refills | Status: DC | PRN

## 2019-01-16 IMAGING — DX DG WRIST COMPLETE 3+V*L*
4 series · 4 of 4 positions shown · non-contrast
Comparison: None.

CLINICAL DATA: Trip and fall with left wrist pain and deformity,
initial encounter

EXAM:
LEFT WRIST - COMPLETE 3+ VIEW

[wrist pa]
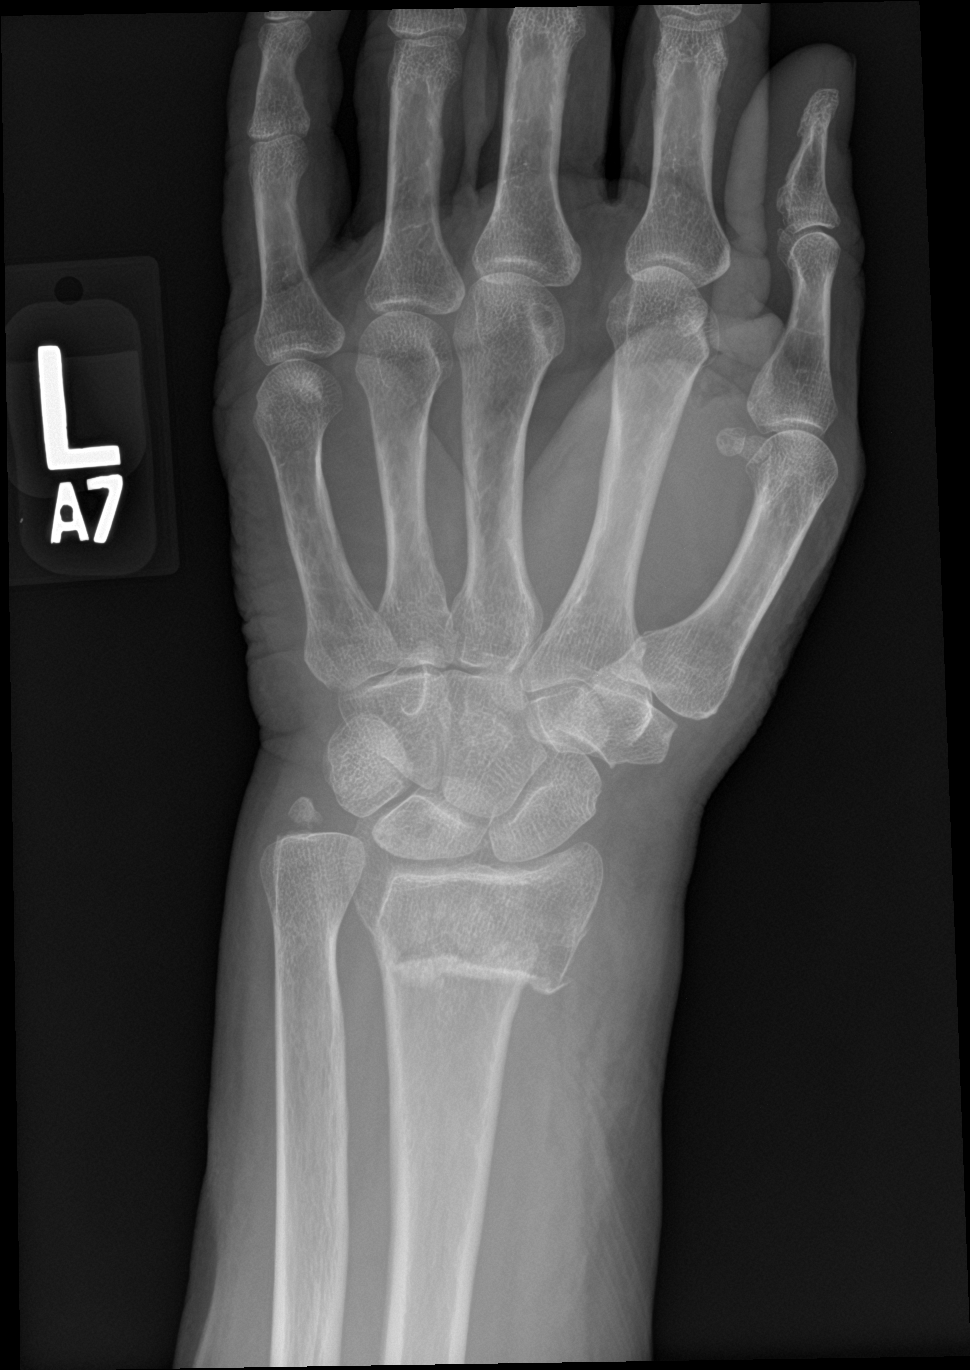

[wrist navicular]
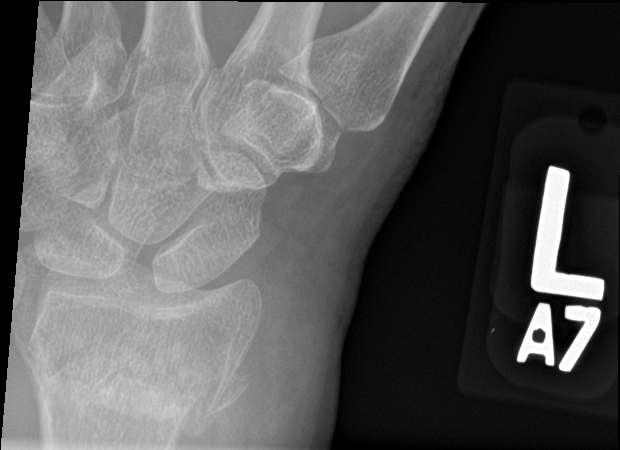

[wrist obl]
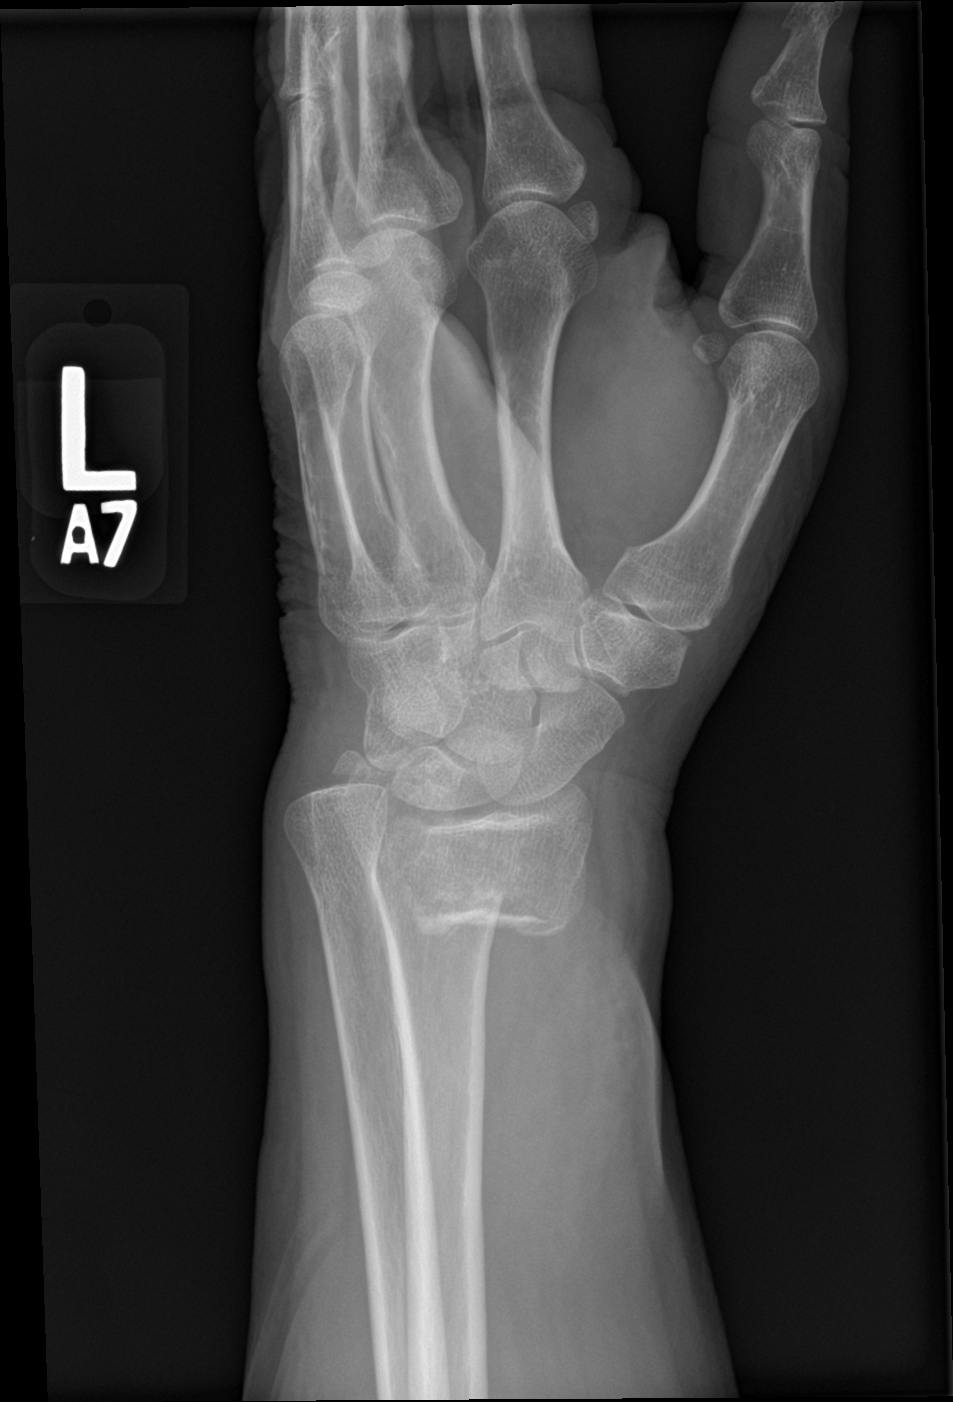

[wrist lat]
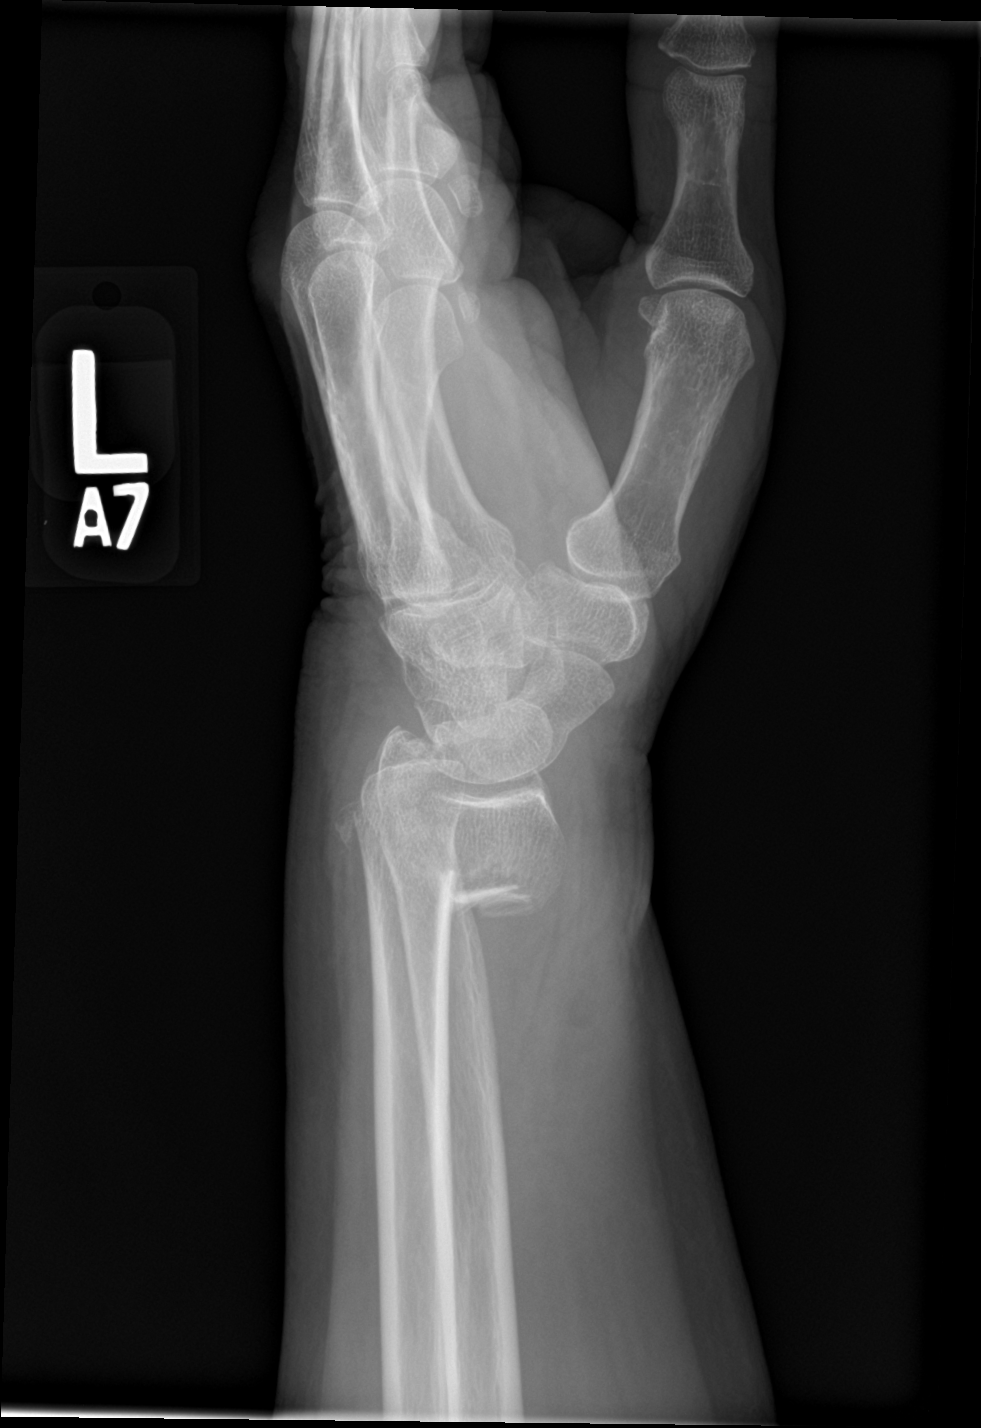

[4 of 4 positions shown; findings below may reference images not displayed]

FINDINGS: Transverse fracture through the distal radius is noted with
impaction and anterior angulation at the fracture site. The radial
fracture extends into the radiocarpal joint posteriorly. Ulnar
styloid fracture is noted as well. No other fractures are seen.
IMPRESSION: Distal radial and ulnar fractures as described

## 2019-03-12 ENCOUNTER — Other Ambulatory Visit: Payer: Self-pay | Admitting: Internal Medicine

## 2019-03-12 DIAGNOSIS — K219 Gastro-esophageal reflux disease without esophagitis: Secondary | ICD-10-CM

## 2019-04-11 ENCOUNTER — Other Ambulatory Visit: Payer: Self-pay | Admitting: Internal Medicine

## 2019-04-11 DIAGNOSIS — K219 Gastro-esophageal reflux disease without esophagitis: Secondary | ICD-10-CM

## 2019-04-23 DIAGNOSIS — L304 Erythema intertrigo: Secondary | ICD-10-CM | POA: Diagnosis not present

## 2019-04-23 DIAGNOSIS — L820 Inflamed seborrheic keratosis: Secondary | ICD-10-CM | POA: Diagnosis not present

## 2019-04-23 DIAGNOSIS — L812 Freckles: Secondary | ICD-10-CM | POA: Diagnosis not present

## 2019-04-23 DIAGNOSIS — D2261 Melanocytic nevi of right upper limb, including shoulder: Secondary | ICD-10-CM | POA: Diagnosis not present

## 2019-04-23 DIAGNOSIS — D1801 Hemangioma of skin and subcutaneous tissue: Secondary | ICD-10-CM | POA: Diagnosis not present

## 2019-04-23 DIAGNOSIS — L821 Other seborrheic keratosis: Secondary | ICD-10-CM | POA: Diagnosis not present

## 2019-05-04 DIAGNOSIS — H524 Presbyopia: Secondary | ICD-10-CM | POA: Diagnosis not present

## 2019-05-04 DIAGNOSIS — H2513 Age-related nuclear cataract, bilateral: Secondary | ICD-10-CM | POA: Diagnosis not present

## 2019-05-28 ENCOUNTER — Other Ambulatory Visit: Payer: Self-pay | Admitting: Internal Medicine

## 2019-05-28 DIAGNOSIS — K219 Gastro-esophageal reflux disease without esophagitis: Secondary | ICD-10-CM

## 2019-06-04 ENCOUNTER — Other Ambulatory Visit: Payer: Self-pay | Admitting: Internal Medicine

## 2019-06-04 DIAGNOSIS — K219 Gastro-esophageal reflux disease without esophagitis: Secondary | ICD-10-CM

## 2019-06-04 MED ORDER — OMEPRAZOLE 40 MG PO CPDR: 40.0000 mg | DELAYED_RELEASE_CAPSULE | Freq: Every day | ORAL | 0 refills | Status: DC

## 2019-06-04 NOTE — Telephone Encounter (Signed)
Medication Refill - Medication: omeprazole   Has the patient contacted their pharmacy? No. (Agent: If no, request that the patient contact the pharmacy for the refill.) (Agent: If yes, when and what did the pharmacy advise?)  Preferred Pharmacy (with phone number or street name):  [facility] [place] [facility] [address] [place] [address]  Phone: [phone] Fax: [phone]  Not a 24 hour pharmacy; exact hours not known.     Agent: Please be advised that RX refills may take up to 3 business days. We ask that you follow-up with your pharmacy.

## 2019-06-08 DIAGNOSIS — R69 Illness, unspecified: Secondary | ICD-10-CM | POA: Diagnosis not present

## 2019-06-14 ENCOUNTER — Ambulatory Visit (INDEPENDENT_AMBULATORY_CARE_PROVIDER_SITE_OTHER): Payer: Medicare HMO

## 2019-06-14 ENCOUNTER — Other Ambulatory Visit: Payer: Self-pay

## 2019-06-14 DIAGNOSIS — Z23 Encounter for immunization: Secondary | ICD-10-CM | POA: Diagnosis not present

## 2019-06-14 DIAGNOSIS — R69 Illness, unspecified: Secondary | ICD-10-CM | POA: Diagnosis not present

## 2019-06-21 ENCOUNTER — Other Ambulatory Visit: Payer: Self-pay | Admitting: Internal Medicine

## 2019-06-21 DIAGNOSIS — K219 Gastro-esophageal reflux disease without esophagitis: Secondary | ICD-10-CM

## 2019-06-28 ENCOUNTER — Telehealth: Payer: Self-pay | Admitting: Internal Medicine

## 2019-07-07 ENCOUNTER — Other Ambulatory Visit (INDEPENDENT_AMBULATORY_CARE_PROVIDER_SITE_OTHER): Payer: Medicare HMO

## 2019-07-07 ENCOUNTER — Other Ambulatory Visit: Payer: Self-pay

## 2019-07-07 ENCOUNTER — Encounter: Payer: Self-pay | Admitting: Internal Medicine

## 2019-07-07 ENCOUNTER — Ambulatory Visit (INDEPENDENT_AMBULATORY_CARE_PROVIDER_SITE_OTHER)
Admission: RE | Admit: 2019-07-07 | Discharge: 2019-07-07 | Disposition: A | Discharge status: Home / Self Care | Payer: Medicare HMO | Source: Ambulatory Visit | Attending: Internal Medicine | Admitting: Internal Medicine

## 2019-07-07 ENCOUNTER — Ambulatory Visit (INDEPENDENT_AMBULATORY_CARE_PROVIDER_SITE_OTHER): Payer: Medicare HMO | Admitting: Internal Medicine

## 2019-07-07 VITALS — BP 136/86 | HR 80 | Temp 98.2°F | Resp 16 | Ht 65.0 in | Wt 150.0 lb

## 2019-07-07 DIAGNOSIS — Z0000 Encounter for general adult medical examination without abnormal findings: Secondary | ICD-10-CM

## 2019-07-07 DIAGNOSIS — R05 Cough: Secondary | ICD-10-CM | POA: Diagnosis not present

## 2019-07-07 DIAGNOSIS — M8000XD Age-related osteoporosis with current pathological fracture, unspecified site, subsequent encounter for fracture with routine healing: Secondary | ICD-10-CM | POA: Diagnosis not present

## 2019-07-07 DIAGNOSIS — I10 Essential (primary) hypertension: Secondary | ICD-10-CM

## 2019-07-07 DIAGNOSIS — E785 Hyperlipidemia, unspecified: Secondary | ICD-10-CM | POA: Diagnosis not present

## 2019-07-07 DIAGNOSIS — R059 Cough, unspecified: Secondary | ICD-10-CM

## 2019-07-07 DIAGNOSIS — J4540 Moderate persistent asthma, uncomplicated: Secondary | ICD-10-CM | POA: Diagnosis not present

## 2019-07-07 DIAGNOSIS — E039 Hypothyroidism, unspecified: Secondary | ICD-10-CM

## 2019-07-07 LAB — LIPID PANEL
Cholesterol: 204 mg/dL — ABNORMAL HIGH (ref 0–200)
HDL: 62.1 mg/dL (ref >39.00)
LDL Cholesterol: 118 mg/dL — ABNORMAL HIGH (ref 0–99)
NonHDL: 141.72
Total CHOL/HDL Ratio: 3
Triglycerides: 121 mg/dL (ref 0.0–149.0)
VLDL: 24.2 mg/dL (ref 0.0–40.0)

## 2019-07-07 LAB — CBC WITH DIFFERENTIAL/PLATELET
Basophils Absolute: 0.1 10*3/uL (ref 0.0–0.1)
Basophils Relative: 1 % (ref 0.0–3.0)
Eosinophils Absolute: 0.6 10*3/uL (ref 0.0–0.7)
Eosinophils Relative: 9.2 % — ABNORMAL HIGH (ref 0.0–5.0)
HCT: 43.1 % (ref 36.0–46.0)
Hemoglobin: 14.4 g/dL (ref 12.0–15.0)
Lymphocytes Relative: 32.1 % (ref 12.0–46.0)
Lymphs Abs: 2.1 10*3/uL (ref 0.7–4.0)
MCHC: 33.4 g/dL (ref 30.0–36.0)
MCV: 92 fl (ref 78.0–100.0)
Monocytes Absolute: 0.7 10*3/uL (ref 0.1–1.0)
Monocytes Relative: 9.9 % (ref 3.0–12.0)
Neutro Abs: 3.2 10*3/uL (ref 1.4–7.7)
Neutrophils Relative %: 47.8 % (ref 43.0–77.0)
Platelets: 227 10*3/uL (ref 150.0–400.0)
RBC: 4.68 Mil/uL (ref 3.87–5.11)
RDW: 13.2 % (ref 11.5–15.5)
WBC: 6.7 10*3/uL (ref 4.0–10.5)

## 2019-07-07 LAB — HEPATIC FUNCTION PANEL
ALT: 14 U/L (ref 0–35)
AST: 21 U/L (ref 0–37)
Albumin: 4.6 g/dL (ref 3.5–5.2)
Alkaline Phosphatase: 63 U/L (ref 39–117)
Bilirubin, Direct: 0.1 mg/dL (ref 0.0–0.3)
Total Bilirubin: 0.5 mg/dL (ref 0.2–1.2)
Total Protein: 7.6 g/dL (ref 6.0–8.3)

## 2019-07-07 LAB — BASIC METABOLIC PANEL
BUN: 24 mg/dL — ABNORMAL HIGH (ref 6–23)
CO2: 30 mEq/L (ref 19–32)
Calcium: 9.9 mg/dL (ref 8.4–10.5)
Chloride: 105 mEq/L (ref 96–112)
Creatinine, Ser: 0.87 mg/dL (ref 0.40–1.20)
GFR: 64.68 mL/min (ref >60.00)
Glucose, Bld: 94 mg/dL (ref 70–99)
Potassium: 3.7 mEq/L (ref 3.5–5.1)
Sodium: 142 mEq/L (ref 135–145)

## 2019-07-07 MED ORDER — MOMETASONE FURO-FORMOTEROL FUM 100-5 MCG/ACT IN AERO: 2.0000 | INHALATION_SPRAY | Freq: Two times a day (BID) | RESPIRATORY_TRACT | 1 refills | Status: DC

## 2019-07-07 NOTE — Progress Notes (Signed)
Subjective:  Patient ID: Tracey Boyd, female    DOB: May 24, 1951  Age: 68 y.o. MRN: [id number]  CC: Hyperlipidemia, Annual Exam, and Cough   HPI Tracey Boyd presents for a CPX.  She complains of a 42-month history of cough productive of clear phlegm with wheezing and shortness of breath.  She is only using the [facility] inhaler 1 puff once a day.  Outpatient Medications Prior to Visit  Medication Sig Dispense Refill  . albuterol (PROAIR HFA) 108 (90 Base) MCG/ACT inhaler Inhale 2 puffs into the lungs every 6 (six) hours as needed for wheezing or shortness of breath. 18 g 3  . Ascorbic Acid (VITAMIN C ER PO) Take 1 tablet by mouth.    . Levocetirizine Dihydrochloride (XYZAL PO) Take 1 tablet by mouth.    . levothyroxine (SYNTHROID, LEVOTHROID) 50 MCG tablet Take 50 mcg by mouth daily before breakfast.    . Multiple Vitamin (MULTIVITAMIN WITH MINERALS) TABS tablet Take 1 tablet by mouth daily. Centrum Silver    . Omega-3 Fatty Acids (FISH OIL CONCENTRATE PO) Take 1 tablet by mouth.    Marland Kitchen omeprazole (PRILOSEC) 40 MG capsule Take 1 capsule (40 mg total) by mouth daily. 30 capsule 0  . Propylene Glycol (SYSTANE BALANCE OP) Apply 1 drop to eye.    Marland Kitchen Respiratory Therapy Supplies (FLUTTER) DEVI Use as directed 1 each 0  . mometasone-formoterol (DULERA) 100-5 MCG/ACT AERO Inhale 2 puffs 2 (two) times daily into the lungs. 3 Inhaler 3  . azithromycin (ZITHROMAX) 250 MG tablet Take 500 mg the first day then 250 mg the next 4 days. Total of 5 days. 6 tablet 0   No facility-administered medications prior to visit.     ROS Review of Systems  Constitutional: Negative.  Negative for chills, diaphoresis, fatigue, fever and unexpected weight change.  HENT: Negative.   Eyes: Negative.  Negative for visual disturbance.  Respiratory: Positive for cough and wheezing. Negative for chest tightness and shortness of breath.   Gastrointestinal: Negative for abdominal pain, constipation, diarrhea and  vomiting.  Endocrine: Negative.   Genitourinary: Negative.  Negative for difficulty urinating.  Musculoskeletal: Negative.  Negative for arthralgias and myalgias.  Skin: Negative.  Negative for pallor and rash.  Neurological: Negative for dizziness, weakness, light-headedness and headaches.  Hematological: Negative for adenopathy. Does not bruise/bleed easily.  Psychiatric/Behavioral: Negative.     Objective:  BP 136/86 (BP Location: Left Arm, Patient Position: Sitting, Cuff Size: Normal)   Pulse 80   Temp 98.2 F (36.8 C) (Oral)   Resp 16   Ht 5\' 5"  (1.651 m)   Wt 150 lb (68 kg)   SpO2 99%   BMI 24.96 kg/m   BP Readings from Last 3 Encounters:  07/07/19 136/86  09/11/18 138/88  09/05/18 125/90    Wt Readings from Last 3 Encounters:  07/07/19 150 lb (68 kg)  09/11/18 146 lb (66.2 kg)  09/05/18 152 lb (68.9 kg)    Physical Exam Vitals signs reviewed.  Constitutional:      General: She is not in acute distress.    Appearance: Normal appearance. She is not ill-appearing, toxic-appearing or diaphoretic.  HENT:     Nose: Nose normal.     Mouth/Throat:     Mouth: Mucous membranes are moist.  Eyes:     General: No scleral icterus.    Conjunctiva/sclera: Conjunctivae normal.  Neck:     Musculoskeletal: Neck supple.  Cardiovascular:     Rate and Rhythm: Normal rate and regular  rhythm.     Heart sounds: No murmur.  Pulmonary:     Effort: Pulmonary effort is normal. No tachypnea or respiratory distress.     Breath sounds: No stridor. No wheezing, rhonchi or rales.  Abdominal:     General: Abdomen is flat.     Palpations: There is no mass.     Tenderness: There is no abdominal tenderness. There is no guarding.  Musculoskeletal: Normal range of motion.     Right lower leg: No edema.     Left lower leg: No edema.  Lymphadenopathy:     Cervical: No cervical adenopathy.  Skin:    General: Skin is warm and dry.     Coloration: Skin is not pale.  Neurological:      General: No focal deficit present.     Mental Status: She is alert and oriented to person, place, and time.  Psychiatric:        Mood and Affect: Mood normal.        Behavior: Behavior normal.     Lab Results  Component Value Date   WBC 6.7 07/07/2019   HGB 14.4 07/07/2019   HCT 43.1 07/07/2019   PLT 227.0 07/07/2019   GLUCOSE 94 07/07/2019   CHOL 204 (H) 07/07/2019   TRIG 121.0 07/07/2019   HDL 62.10 07/07/2019   LDLCALC 118 (H) 07/07/2019   ALT 14 07/07/2019   AST 21 07/07/2019   NA 142 07/07/2019   K 3.7 07/07/2019   CL 105 07/07/2019   CREATININE 0.87 07/07/2019   BUN 24 (H) 07/07/2019   CO2 30 07/07/2019   TSH 2.95 07/07/2019    Dg Chest 2 View  Result Date: 09/11/2018 CLINICAL DATA:  68 year old female with a history of congestion and cough EXAM: CHEST - 2 VIEW COMPARISON:  Eighteen 2009 FINDINGS: Cardiomediastinal silhouette unchanged in size and contour. No pneumothorax or pleural effusion. No confluent airspace disease. No displaced fracture. IMPRESSION: Negative for acute cardiopulmonary disease Electronically Signed   By: Corrie Mckusick D.O.   On: 09/11/2018 16:27   Dg Chest 2 View  Result Date: 07/08/2019 CLINICAL DATA:  Cough for 6 months. EXAM: CHEST - 2 VIEW COMPARISON:  09/11/2018 FINDINGS: The heart size and mediastinal contours are within normal limits. Both lungs are clear except for a tiny area atelectasis at the left lung base. The visualized skeletal structures are unremarkable. IMPRESSION: Tiny area of atelectasis at the left lung base. Otherwise, normal exam. Electronically Signed   By: Lorriane Shire M.D.   On: 07/08/2019 08:00    Assessment & Plan:   Tracey Boyd was seen today for hyperlipidemia, annual exam and cough.  Diagnoses and all orders for this visit:  Essential hypertension- Her blood pressure is adequately well controlled. -     CBC with Differential/Platelet; Future  Acquired hypothyroidism- Her TSH is in the normal range.  She will  remain on the current dose of levothyroxine. -     TSH; Future  Age-related osteoporosis with current pathological fracture with routine healing, subsequent encounter -     Basic metabolic panel; Future -     VITAMIN D 25 Hydroxy (Vit-D Deficiency, Fractures); Future  Hyperlipidemia with target LDL less than 130- She does not have an elevated ASCVD risk or so I did not recommend a statin for CV risk reduction. -     Lipid panel; Future -     Hepatic function panel; Future  Cough- Chest x-ray is negative for mass or infiltrate.  I  think her symptoms are related to asthma.  Her insurance does not cover Dulera but it does Symbicort.  I have asked her to use symbocort twice a day as directed. -     DG Chest 2 View; Future  Asthma, moderate persistent, well-controlled -     mometasone-formoterol (DULERA) 100-5 MCG/ACT AERO; Inhale 2 puffs into the lungs 2 (two) times daily.  Routine general medical examination at a health care facility- Exam completed, labs reviewed, vaccines reviewed and updated, colon cancer screening is up-to-date, she tells me her Pap and mammogram have been done within the last year with her gynecologist.   I have discontinued Kaleeya Dinges's mometasone-formoterol, azithromycin, and mometasone-formoterol. I am also having her start on budesonide-formoterol. Additionally, I am having her maintain her Flutter, levothyroxine, multivitamin with minerals, Levocetirizine Dihydrochloride (XYZAL PO), Propylene Glycol (SYSTANE BALANCE OP), Omega-3 Fatty Acids (FISH OIL CONCENTRATE PO), Ascorbic Acid (VITAMIN C ER PO), albuterol, and omeprazole.  Meds ordered this encounter  Medications  . DISCONTD: mometasone-formoterol (DULERA) 100-5 MCG/ACT AERO    Sig: Inhale 2 puffs into the lungs 2 (two) times daily.    Dispense:  39 g    Refill:  1  . budesonide-formoterol (SYMBICORT) 160-4.5 MCG/ACT inhaler    Sig: Inhale 2 puffs into the lungs 2 (two) times daily.    Dispense:  3  Inhaler    Refill:  1     Follow-up: Return in about 4 weeks (around 08/04/2019).  Scarlette Calico, MD

## 2019-07-07 NOTE — Patient Instructions (Signed)

## 2019-07-08 ENCOUNTER — Encounter: Payer: Self-pay | Admitting: Internal Medicine

## 2019-07-08 LAB — TSH: TSH: 2.95 u[IU]/mL (ref 0.35–4.50)

## 2019-07-08 LAB — VITAMIN D 25 HYDROXY (VIT D DEFICIENCY, FRACTURES): VITD: 69.3 ng/mL (ref 30.00–100.00)

## 2019-07-08 NOTE — Progress Notes (Signed)
Faxing request to [facility] for pap and mammo results

## 2019-07-09 ENCOUNTER — Encounter: Payer: Self-pay | Admitting: Internal Medicine

## 2019-07-10 MED ORDER — BUDESONIDE-FORMOTEROL FUMARATE 160-4.5 MCG/ACT IN AERO: 2.0000 | INHALATION_SPRAY | Freq: Two times a day (BID) | RESPIRATORY_TRACT | 1 refills | Status: DC

## 2019-07-12 ENCOUNTER — Other Ambulatory Visit: Payer: Self-pay | Admitting: Internal Medicine

## 2019-07-12 DIAGNOSIS — K219 Gastro-esophageal reflux disease without esophagitis: Secondary | ICD-10-CM

## 2019-07-12 MED ORDER — OMEPRAZOLE 40 MG PO CPDR: 40.0000 mg | DELAYED_RELEASE_CAPSULE | Freq: Every day | ORAL | 0 refills | Status: DC

## 2019-07-12 NOTE — Telephone Encounter (Signed)
omeprazole (PRILOSEC) 40 MG capsule  [facility] [place] [place] to [address] [employer] [address] [phone] (Phone) [phone] (Fax)   [phone] Home  Pt had appt 10/28 and this was discussed to be called in, please follow up at home # if there is a problem.

## 2019-07-29 ENCOUNTER — Ambulatory Visit (INDEPENDENT_AMBULATORY_CARE_PROVIDER_SITE_OTHER): Payer: Medicare HMO | Admitting: *Deleted

## 2019-07-29 ENCOUNTER — Other Ambulatory Visit: Payer: Self-pay | Admitting: Internal Medicine

## 2019-07-29 ENCOUNTER — Other Ambulatory Visit: Payer: Self-pay

## 2019-07-29 DIAGNOSIS — K219 Gastro-esophageal reflux disease without esophagitis: Secondary | ICD-10-CM

## 2019-07-29 DIAGNOSIS — Z0000 Encounter for general adult medical examination without abnormal findings: Secondary | ICD-10-CM | POA: Diagnosis not present

## 2019-07-29 NOTE — Progress Notes (Signed)
Subjective:   Tracey Boyd is a 68 y.o. female who presents for Medicare Annual (Subsequent) preventive examination. I connected with patient by a telephone and verified that I am speaking with the correct person using two identifiers. Patient stated full name and DOB. Patient gave permission to continue with telephonic visit. Patient's location was at home and Nurse's location was at [place] office. Participants during this visit included patient and nurse.  Review of Systems:     Sleep patterns: feels rested on waking, gets up 1-2 times nightly to void and sleeps 7-8 hours nightly.    Home Safety/Smoke Alarms: Feels safe in home. Smoke alarms in place.  Living environment; residence and Firearm Safety: 2-story house. Lives with husband, no needs for DME, good support system Seat Belt Safety/Bike Helmet: Wears seat belt.     Objective:     Vitals: There were no vitals taken for this visit.  There is no height or weight on file to calculate BMI.  Advanced Directives 07/22/2018 11/08/2016 03/11/2016  Does Patient Have a Medical Advance Directive? Yes Yes Yes  Type of Paramedic of [place];Living will Living will [place];Living will  Does patient want to make changes to medical advance directive? - No - Patient declined No - Patient declined  Copy of [place] in Chart? No - copy requested - No - copy requested  Would patient like information on creating a medical advance directive? - No - Patient declined -    Tobacco Social History   Tobacco Use  Smoking Status Never Smoker  Smokeless Tobacco Never Used  Tobacco Comment   pt states both parents were smokers     Counseling given: Not Answered Comment: pt states both parents were smokers  Past Medical History:  Diagnosis Date  . Anemia    as a child  . Anxiety   . Asthma   . Dermatitis herpetiformis   . GERD (gastroesophageal reflux disease)   . HTN  (hypertension)   . Hypothyroidism    Past Surgical History:  Procedure Laterality Date  . APPENDECTOMY    . COLONOSCOPY    . CYSTECTOMY    . ORIF WRIST FRACTURE Left 11/08/2016   Procedure: OPEN REDUCTION INTERNAL FIXATION (ORIF) LEFT WRIST FRACTURE;  Surgeon: Roseanne Kaufman, MD;  Location: [facility];  Service: Orthopedics;  Laterality: Left;   Family History  Problem Relation Age of Onset  . Multiple sclerosis Mother   . Lung cancer Mother   . Colon polyps Mother   . Alcohol abuse Father   . Multiple sclerosis Sister   . Colon cancer Neg Hx    Social History   Socioeconomic History  . Marital status: Married    Spouse name: Not on file  . Number of children: 1  . Years of education: Not on file  . Highest education level: Not on file  Occupational History  . Occupation: retired  Scientific laboratory technician  . Financial resource strain: Not hard at all  . Food insecurity    Worry: Never true    Inability: Never true  . Transportation needs    Medical: No    Non-medical: No  Tobacco Use  . Smoking status: Never Smoker  . Smokeless tobacco: Never Used  . Tobacco comment: pt states both parents were smokers  Substance and Sexual Activity  . Alcohol use: Yes    Comment: rare  . Drug use: No  . Sexual activity: Not on file  Lifestyle  .  Physical activity    Days per week: 4 days    Minutes per session: 50 min  . Stress: Only a little  Relationships  . Social connections    Talks on phone: More than three times a week    Gets together: More than three times a week    Attends religious service: More than 4 times per year    Active member of club or organization: Yes    Attends meetings of clubs or organizations: More than 4 times per year    Relationship status: Married  Other Topics Concern  . Not on file  Social History Narrative  . Not on file    Outpatient Encounter Medications as of 07/29/2019  Medication Sig  . albuterol (PROAIR HFA) 108 (90 Base) MCG/ACT inhaler Inhale  2 puffs into the lungs every 6 (six) hours as needed for wheezing or shortness of breath.  . Ascorbic Acid (VITAMIN C ER PO) Take 1 tablet by mouth.  . budesonide-formoterol (SYMBICORT) 160-4.5 MCG/ACT inhaler Inhale 2 puffs into the lungs 2 (two) times daily.  . Levocetirizine Dihydrochloride (XYZAL PO) Take 1 tablet by mouth.  . levothyroxine (SYNTHROID, LEVOTHROID) 50 MCG tablet Take 50 mcg by mouth daily before breakfast.  . Multiple Vitamin (MULTIVITAMIN WITH MINERALS) TABS tablet Take 1 tablet by mouth daily. Centrum Silver  . Omega-3 Fatty Acids (FISH OIL CONCENTRATE PO) Take 1 tablet by mouth.  Marland Kitchen omeprazole (PRILOSEC) 40 MG capsule TAKE 1 CAPSULE DAILY  . Propylene Glycol (SYSTANE BALANCE OP) Apply 1 drop to eye.  Marland Kitchen Respiratory Therapy Supplies (FLUTTER) DEVI Use as directed  . [DISCONTINUED] omeprazole (PRILOSEC) 40 MG capsule Take 1 capsule (40 mg total) by mouth daily.   No facility-administered encounter medications on file as of 07/29/2019.     Activities of Daily Living No flowsheet data found.  Patient Care Team: Janith Lima, MD as PCP - General (Internal Medicine) Arvella Nigh, MD as Consulting Physician (Obstetrics and Gynecology)    Assessment:   This is a routine wellness examination for Tracey Boyd. Physical assessment deferred to PCP.  Exercise Activities and Dietary recommendations    Diet (meal preparation, eat out, water intake, caffeinated beverages, dairy products, fruits and vegetables): in general, a "healthy" diet  , well balanced eats a variety of fruits and vegetables daily  Encouraged patient to increase daily water and healthy fluid intake.  Goals    . Patient Stated     Continue to exercise and eat healthy. I would like to start to do yoga to maintain strength in my core and use weights to strengthen my arms.        Fall Risk Fall Risk  07/07/2019 07/22/2018 07/18/2017 07/16/2017 03/11/2016  Falls in the past year? 0 1 No No No  Number falls in  past yr: 0 0 - - -  Injury with Fall? 0 1 - - -  Follow up Falls evaluation completed - - - -    Depression Screen PHQ 2/9 Scores 07/07/2019 07/22/2018 07/18/2017 07/16/2017  PHQ - 2 Score 0 1 0 0  PHQ- 9 Score 2 2 - 1     Cognitive Function       Ad8 score reviewed for issues:  Issues making decisions: no  Less interest in hobbies / activities: no  Repeats questions, stories (family complaining): no  Trouble using ordinary gadgets (microwave, computer, phone):no  Forgets the month or year: no  Mismanaging finances: no  Remembering appts: no  Daily problems with  thinking and/or memory: no Ad8 score is= 0  Immunization History  Administered Date(s) Administered  . Fluad Quad(high Dose 65+) 06/14/2019  . Influenza Inj Mdck Quad Pf 07/04/2017  . Influenza Split 07/10/2012  . Influenza Whole 08/31/2010, 05/20/2011  . Influenza, High Dose Seasonal PF 06/09/2018  . Influenza,inj,Quad PF,6+ Mos 07/22/2013, 08/25/2014  . Pneumococcal Conjugate-13 03/07/2016  . Pneumococcal Polysaccharide-23 07/16/2017  . Td 08/31/2010  . Zoster 11/01/2011   Screening Tests Health Maintenance  Topic Date Due  . DEXA SCAN  03/02/2016  . TETANUS/TDAP  08/31/2020  . MAMMOGRAM  09/07/2020  . COLONOSCOPY  11/07/2021  . INFLUENZA VACCINE  Completed  . Hepatitis C Screening  Completed  . PNA vac Low Risk Adult  Completed      Plan:     Reviewed health maintenance screenings with patient today and relevant education, vaccines, and/or referrals were provided.   Continue to eat heart healthy diet (full of fruits, vegetables, whole grains, lean protein, water--limit salt, fat, and sugar intake) and increase physical activity as tolerated.  Continue doing brain stimulating activities (puzzles, reading, adult coloring books, staying active) to keep memory sharp.   I have personally reviewed and noted the following in the patient's chart:   . Medical and social history . Use of alcohol,  tobacco or illicit drugs  . Current medications and supplements . Functional ability and status . Nutritional status . Physical activity . Advanced directives . List of other physicians . Screenings to include cognitive, depression, and falls . Referrals and appointments  In addition, I have reviewed and discussed with patient certain preventive protocols, quality metrics, and best practice recommendations. A written personalized care plan for preventive services as well as general preventive health recommendations were provided to patient.     Michiel Cowboy, RN  07/29/2019   Medical screening examination/treatment/procedure(s) were performed by non-physician practitioner and as supervising physician I was immediately available for consultation/collaboration. I agree with above. Scarlette Calico, MD

## 2019-08-11 DIAGNOSIS — M816 Localized osteoporosis [Lequesne]: Secondary | ICD-10-CM | POA: Diagnosis not present

## 2019-08-11 DIAGNOSIS — Z6825 Body mass index (BMI) 25.0-25.9, adult: Secondary | ICD-10-CM | POA: Diagnosis not present

## 2019-08-11 DIAGNOSIS — Z01419 Encounter for gynecological examination (general) (routine) without abnormal findings: Secondary | ICD-10-CM | POA: Diagnosis not present

## 2019-08-11 DIAGNOSIS — N958 Other specified menopausal and perimenopausal disorders: Secondary | ICD-10-CM | POA: Diagnosis not present

## 2019-08-17 ENCOUNTER — Other Ambulatory Visit: Payer: Self-pay

## 2019-08-17 ENCOUNTER — Encounter: Payer: Self-pay | Admitting: Nurse Practitioner

## 2019-08-17 ENCOUNTER — Ambulatory Visit: Payer: Medicare HMO | Admitting: Nurse Practitioner

## 2019-08-17 VITALS — BP 138/86 | HR 84 | Temp 97.3°F | Ht 63.75 in

## 2019-08-17 DIAGNOSIS — R499 Unspecified voice and resonance disorder: Secondary | ICD-10-CM

## 2019-08-17 DIAGNOSIS — K219 Gastro-esophageal reflux disease without esophagitis: Secondary | ICD-10-CM | POA: Diagnosis not present

## 2019-08-17 MED ORDER — PANTOPRAZOLE SODIUM 40 MG PO TBEC: 40.0000 mg | DELAYED_RELEASE_TABLET | Freq: Two times a day (BID) | ORAL | 2 refills | Status: DC

## 2019-08-17 NOTE — Progress Notes (Signed)
08/17/2019 Tracey Boyd [id number] October 08, 1950   HISTORY OF PRESENT ILLNESS: Patient is a 68 year old female with a past medical history of anxiety, asthma, hypertension, hypothyroidism, and GERD.  Past appendectomy.  She presents today with complaints of having a raspy voice for the past 2 years.  She can no longer seeing as well as she did in the past.  She denies having any dysphagia, heartburn or stomach pain.  She took omeprazole 40 mg twice daily for a brief period of time approximately 1-1/2 years ago without improvement.  She elected to continue the omeprazole 40 mg once daily since then.  She had some postnasal drainage and took Mucinex a few months ago which made her feel worse and created a cough at nighttime.  She stopped the Mucinex and she is no longer coughing.  She is passing a normal formed brown bowel movement daily.  No rectal bleeding or melena.  She has completed 2 colonoscopies by Dr. Maurene Capes in 2003 and 2013 which were normal, no evidence of colon polyps.  She was advised by Dr. Maurene Capes to repeat a colonoscopy in 10 years.  She reports her mother has a history of colon polyps, MS and lung cancer.  No family history of colorectal cancer.  She denies taking any NSAIDs.  She has osteoporosis and she started Fosamax 70 mg once weekly on 08/16/2019.  Rare alcohol use.  She is a non-smoker.  Colonoscopy by Dr. Elicia Lamp 11/08/2011: No polyps 10-year recall  Colonoscopy 10/13/2001: -No polyps  Past Medical History:  Diagnosis Date  . Anemia    as a child  . Anxiety   . Asthma   . Dermatitis herpetiformis   . GERD (gastroesophageal reflux disease)   . HTN (hypertension)   . Hypothyroidism    Past Surgical History:  Procedure Laterality Date  . APPENDECTOMY    . COLONOSCOPY    . CYSTECTOMY    . ORIF WRIST FRACTURE Left 11/08/2016   Procedure: OPEN REDUCTION INTERNAL FIXATION (ORIF) LEFT WRIST FRACTURE;  Surgeon: Roseanne Kaufman, MD;  Location: [facility];  Service:  Orthopedics;  Laterality: Left;    reports that she has never smoked. She has never used smokeless tobacco. She reports current alcohol use. She reports that she does not use drugs. family history includes Alcohol abuse in her father; Colon polyps in her mother; Dementia in her sister; Lung cancer in her mother; Multiple sclerosis in her mother, paternal aunt, and sister; Other in her grandchild. Allergies  Allergen Reactions  . Ace Inhibitors Cough    Reaction to lisinopril  . Penicillins Hives    Has patient had a PCN reaction causing immediate rash, facial/tongue/throat swelling, SOB or lightheadedness with hypotension: Yes Has patient had a PCN reaction causing severe rash involving mucus membranes or skin necrosis: No Has patient had a PCN reaction that required hospitalization No Has patient had a PCN reaction occurring within the last 10 years: No If all of the above answers are "NO", then may proceed with Cephalosporin use.  . Sulfa Antibiotics Hives  . Sulfonamide Derivatives Hives  . Tetracycline       Outpatient Encounter Medications as of 08/17/2019  Medication Sig  . albuterol (PROAIR HFA) 108 (90 Base) MCG/ACT inhaler Inhale 2 puffs into the lungs every 6 (six) hours as needed for wheezing or shortness of breath.  Marland Kitchen alendronate (FOSAMAX) 70 MG tablet Take 70 mg by mouth once a week.  Marland Kitchen amLODipine (NORVASC) 5 MG tablet Take 5 mg  by mouth as needed.  . Ascorbic Acid (VITAMIN C ER PO) Take 1 tablet by mouth.  . B Complex-C (SUPER B COMPLEX PO) Take 1 tablet by mouth daily.  . budesonide-formoterol (SYMBICORT) 160-4.5 MCG/ACT inhaler Inhale 2 puffs into the lungs 2 (two) times daily.  . Calcium Carb-Cholecalciferol (CALCIUM/VITAMIN D PO) Take 1 tablet by mouth daily.  Marland Kitchen levocetirizine (XYZAL) 5 MG tablet Take 1 tablet by mouth daily.   Marland Kitchen levothyroxine (SYNTHROID, LEVOTHROID) 50 MCG tablet Take 50 mcg by mouth daily before breakfast.  . Multiple Vitamin (MULTIVITAMIN WITH  MINERALS) TABS tablet Take 1 tablet by mouth daily. Centrum Silver  . Omega-3 Fatty Acids (FISH OIL CONCENTRATE PO) Take 1 tablet by mouth.  Marland Kitchen omeprazole (PRILOSEC) 40 MG capsule TAKE 1 CAPSULE DAILY  . Propylene Glycol (SYSTANE BALANCE OP) Apply 1 drop to eye.  Marland Kitchen Respiratory Therapy Supplies (FLUTTER) DEVI Use as directed  . [DISCONTINUED] guaiFENesin (MUCINEX) 600 MG 12 hr tablet Take 600 mg by mouth at bedtime.   No facility-administered encounter medications on file as of 08/17/2019.      REVIEW OF SYSTEMS  : All other systems reviewed and negative except where noted in the History of Present Illness.   PHYSICAL EXAM: BP 138/86 (BP Location: Left Arm, Patient Position: Sitting, Cuff Size: Normal)   Pulse 84   Temp (!) 97.3 F (36.3 C)   Ht 5' 3.75" (1.619 m)   BMI 25.95 kg/m  General: Well developed white female in no acute distress Head: Normocephalic and atraumatic Eyes:  Sclerae anicteric,conjunctive pink Ears: Normal auditory acuity Mouth: Dentition intact, no ulcers or lesions, posterior pharynx with clear postnasal drainage Neck: Supple, no masses Lungs: Clear throughout to auscultation Heart: Regular rate and rhythm, no murmurs  abdomen: Soft, nontender, non distended. No masses or hepatomegaly noted. Normal bowel sounds x 4 quadrants. Rectal: Deferred Musculoskeletal: Symmetrical with no gross deformities  Skin: No lesions on visible extremities Extremities: No edema  Neurological: Alert oriented x 4, grossly nonfocal Cervical Nodes:  No significant cervical adenopathy Psychological:  Alert and cooperative. Normal mood and affect  ASSESSMENT AND PLAN:  78.  68 year old female with a history of GERD presents with laryngeal reflux symptoms, voice hoarseness -ENT consultation recommended, patient will consider -EGD benefits and risk discussed including risk with sedation, bleeding, perforation and infection.  Patient will consider scheduling an EGD in the near  future, she is not prepared to schedule an EGD at this time. -Stop Omeprazole -Pantoprazole 40 mg 1 capsule p.o. twice daily for the next 3 months -Further follow-up to be determined after the above evaluations completed -Patient will call our office if her symptoms worsen  2.  Colon cancer screening.  Family history of colon polyps.  Colonoscopy in 2003 and 2013 were normal, no polyps. -I discussed potentially scheduling a colonoscopy at the time of her EGD if she wishes to combine these procedures.  She did not wish to pursue an earlier colonoscopy.  Recall colonoscopy due March 2023    CC:  Arvella Nigh, MD

## 2019-08-17 NOTE — Patient Instructions (Signed)
If you are age 68 or older, your body mass index should be between 23-30. Your Body mass index is 25.95 kg/m. If this is out of the aforementioned range listed, please consider follow up with your Primary Care Provider.  If you are age 1 or younger, your body mass index should be between 19-25. Your Body mass index is 25.95 kg/m. If this is out of the aformentioned range listed, please consider follow up with your Primary Care Provider.   We have sent the following medications to your pharmacy for you to pick up at your convenience:  1. Start Pantoprazole 40 mg twice daily.   Recommend follow up with Dr. Constance Holster at ENT.   It has been recommended to you by your physician that you have a(n) endoscopy completed. Per your request, we did not schedule the procedure(s) today. Please contact our office at [phone] should you decide to have the procedure completed. You will be scheduled for a pre-visit and procedure at that time.

## 2019-08-18 NOTE — Progress Notes (Signed)
____________________________________________________________  Attending physician addendum:  Thank you for sending this case to me. I have reviewed the entire note.  Without regurgitation or pyrosis, and lack of improvement with PPI, the throat symptoms seem unlikely to be reflux-related, though I expect ENT may say otherwise. If she elects to pursue EGD, it would need to be with Bravo pH testing while off PPI at least 5 days.(hospital endo case b/c Bravo device needed).  Wilfrid Lund, MD  ____________________________________________________________

## 2019-08-23 ENCOUNTER — Telehealth: Payer: Self-pay | Admitting: Nurse Practitioner

## 2019-08-23 DIAGNOSIS — R499 Unspecified voice and resonance disorder: Secondary | ICD-10-CM

## 2019-08-23 DIAGNOSIS — K219 Gastro-esophageal reflux disease without esophagitis: Secondary | ICD-10-CM

## 2019-08-23 NOTE — Telephone Encounter (Signed)
Left message for patient to call back to the office-patient needs to be scheduled with Dr. Loletha Carrow at the hospital for an EGD on his [facility] endo day (10/04/2019);

## 2019-08-23 NOTE — Telephone Encounter (Signed)
I called the patient and discussed recommendations from Dr. Loletha Carrow (refer to office consult 08/17/2019) as follows:  Without regurgitation or pyrosis, and lack of improvement with PPI, the throat symptoms seem unlikely to be reflux-related, though I expect ENT may say otherwise. If she elects to pursue EGD, it would need to be with Bravo pH testing while off PPI at least 5 days.(hospital endo case b/c Bravo device needed).  The patient would like to schedule an EGD after the holidays. She continues to having coughing episodes and voice hoarseness. She stated past ENT told her she had GERD. She will consider scheduling an EGD with Bravo PH testing in Jan. 2021.   Bre, pls contact the patient per her preference the first week of Jan. 2021 and schedule an EGD with Bravo Ph Testing at the hospital with Dr. Loletha Carrow.  thx

## 2019-08-23 NOTE — Telephone Encounter (Signed)
-----   Message from Doran Stabler, MD sent at 08/18/2019  8:03 AM EST -----   ----- Message ----- From: Noralyn Pick, NP Sent: 08/17/2019   5:04 PM EST To: Doran Stabler, MD

## 2019-08-24 ENCOUNTER — Other Ambulatory Visit: Payer: Self-pay

## 2019-08-24 DIAGNOSIS — R499 Unspecified voice and resonance disorder: Secondary | ICD-10-CM

## 2019-08-24 DIAGNOSIS — K219 Gastro-esophageal reflux disease without esophagitis: Secondary | ICD-10-CM

## 2019-08-24 NOTE — Telephone Encounter (Signed)
Patient is scheduled for EGD with bravo placement with MD on 10/04/2019 and it is in the instructions that patient should stop Protonix 5 days prior to procedure;

## 2019-08-24 NOTE — Telephone Encounter (Signed)
FYI,  patient scheduled EGD with Bravo ph placement with you on 1/21

## 2019-08-24 NOTE — Telephone Encounter (Signed)
Thank you for letting me know.  Briana,  Please be sure patient is instructed to stop pantoprazole 5 days before the procedure.

## 2019-08-24 NOTE — Telephone Encounter (Signed)
Patient returned call to the office- patient has been scheduled for her COVID screen on 09/30/2019 at 10:00 am; patient has also been scheduled for her EGD with bravo placement at [facility] on 10/04/2019 at 10:30 am; patient has been mailed instructions and advised to call back to the office should questions/concerns arise; Patient verbalized understanding of information/instructions;

## 2019-08-24 NOTE — Addendum Note (Signed)
Addended by: Mohammed Kindle on: 08/24/2019 10:53 AM   Modules accepted: Orders

## 2019-09-13 DIAGNOSIS — Z1231 Encounter for screening mammogram for malignant neoplasm of breast: Secondary | ICD-10-CM | POA: Diagnosis not present

## 2019-09-15 DIAGNOSIS — R69 Illness, unspecified: Secondary | ICD-10-CM | POA: Diagnosis not present

## 2019-09-17 ENCOUNTER — Telehealth: Payer: Self-pay | Admitting: Gastroenterology

## 2019-09-17 NOTE — Telephone Encounter (Signed)
Dr. Loletha Carrow per Barbera Setters in our meeting this am we are no longer able to schedule screening procedures after the 25th. Just wanted to clarify with you regarding cancelling this procedure.

## 2019-09-17 NOTE — Telephone Encounter (Signed)
This patient is scheduled to have an upper endoscopy with Bravo pH device placement in the [facility] long endoscopy lab on January 25.  Unfortunately, newly placed restrictions on elective outpatient endoscopic procedures mean this procedure must be canceled and rescheduled to a later date.  We do not yet know how long these restrictions will last, but perhaps a few months.  Please put her on our recall list for April of this year, I will put her on my similar list as well.

## 2019-09-20 NOTE — Telephone Encounter (Signed)
The restrictions would include the January 25 cases

## 2019-09-21 ENCOUNTER — Telehealth: Payer: Self-pay | Admitting: *Deleted

## 2019-09-21 NOTE — Telephone Encounter (Signed)
[facility] case cancelled due to [facility] procedure restrictions. COVID screening also cancelled.   Left detailed message on machine for patient.

## 2019-09-30 ENCOUNTER — Other Ambulatory Visit (HOSPITAL_COMMUNITY): Payer: Medicare HMO

## 2019-10-04 ENCOUNTER — Encounter (HOSPITAL_COMMUNITY): Admission: RE | Payer: Self-pay | Source: Home / Self Care

## 2019-10-04 ENCOUNTER — Ambulatory Visit (HOSPITAL_COMMUNITY): Admission: RE | Admit: 2019-10-04 | Payer: Medicare HMO | Source: Home / Self Care | Admitting: Gastroenterology

## 2019-10-04 SURGERY — ESOPHAGOGASTRODUODENOSCOPY (EGD) WITH PROPOFOL
Anesthesia: Monitor Anesthesia Care

## 2019-10-06 ENCOUNTER — Telehealth: Payer: Self-pay | Admitting: *Deleted

## 2019-10-06 ENCOUNTER — Encounter: Payer: Self-pay | Admitting: *Deleted

## 2019-10-06 ENCOUNTER — Other Ambulatory Visit: Payer: Self-pay | Admitting: *Deleted

## 2019-10-06 DIAGNOSIS — K219 Gastro-esophageal reflux disease without esophagitis: Secondary | ICD-10-CM

## 2019-10-06 DIAGNOSIS — R499 Unspecified voice and resonance disorder: Secondary | ICD-10-CM

## 2019-10-06 NOTE — Telephone Encounter (Signed)
Patient scheduled for EGD w/bravo. Following appts are as follows, patient aware:  -10/14/19 at 4:30 pm PV with nurse -10/28/19 at 10:10 am COVID screening -11/01/19 at 0915 [facility] EGD w/bravo   Patient aware to hold Pantoprazole for 5 days prior to the procedure.

## 2019-10-14 ENCOUNTER — Ambulatory Visit (AMBULATORY_SURGERY_CENTER): Payer: Self-pay

## 2019-10-14 ENCOUNTER — Other Ambulatory Visit: Payer: Self-pay

## 2019-10-14 VITALS — Ht 63.0 in | Wt 147.0 lb

## 2019-10-14 DIAGNOSIS — K219 Gastro-esophageal reflux disease without esophagitis: Secondary | ICD-10-CM

## 2019-10-14 NOTE — Progress Notes (Signed)
Denies allergies to eggs or soy products. Denies complication of anesthesia or sedation. Denies use of weight loss medication. Denies use of O2.   Emmi instructions given for colonoscopy.  

## 2019-10-18 ENCOUNTER — Encounter: Payer: Self-pay | Admitting: Gastroenterology

## 2019-10-28 ENCOUNTER — Other Ambulatory Visit (HOSPITAL_COMMUNITY): Payer: Medicare HMO

## 2019-10-30 ENCOUNTER — Other Ambulatory Visit (HOSPITAL_COMMUNITY)
Admission: RE | Admit: 2019-10-30 | Discharge: 2019-10-30 | Disposition: A | Discharge status: Home / Self Care | Payer: Medicare HMO | Source: Ambulatory Visit | Attending: Gastroenterology | Admitting: Gastroenterology

## 2019-10-30 DIAGNOSIS — Z20822 Contact with and (suspected) exposure to covid-19: Secondary | ICD-10-CM | POA: Diagnosis not present

## 2019-10-30 DIAGNOSIS — Z01812 Encounter for preprocedural laboratory examination: Secondary | ICD-10-CM | POA: Insufficient documentation

## 2019-10-30 LAB — SARS CORONAVIRUS 2 (TAT 6-24 HRS): SARS Coronavirus 2: NEGATIVE

## 2019-11-01 ENCOUNTER — Ambulatory Visit (HOSPITAL_COMMUNITY): Payer: Medicare HMO | Admitting: Anesthesiology

## 2019-11-01 ENCOUNTER — Encounter (HOSPITAL_COMMUNITY)
Admission: RE | Disposition: A | Discharge status: Home / Self Care | Payer: Self-pay | Source: Home / Self Care | Attending: Gastroenterology

## 2019-11-01 ENCOUNTER — Ambulatory Visit (HOSPITAL_COMMUNITY)
Admission: RE | Admit: 2019-11-01 | Discharge: 2019-11-01 | Disposition: A | Discharge status: Home / Self Care | Payer: Medicare HMO | Attending: Gastroenterology | Admitting: Gastroenterology

## 2019-11-01 ENCOUNTER — Encounter (HOSPITAL_COMMUNITY): Payer: Self-pay | Admitting: Gastroenterology

## 2019-11-01 ENCOUNTER — Other Ambulatory Visit: Payer: Self-pay

## 2019-11-01 DIAGNOSIS — K319 Disease of stomach and duodenum, unspecified: Secondary | ICD-10-CM | POA: Diagnosis not present

## 2019-11-01 DIAGNOSIS — J45909 Unspecified asthma, uncomplicated: Secondary | ICD-10-CM | POA: Diagnosis not present

## 2019-11-01 DIAGNOSIS — K219 Gastro-esophageal reflux disease without esophagitis: Secondary | ICD-10-CM

## 2019-11-01 DIAGNOSIS — E785 Hyperlipidemia, unspecified: Secondary | ICD-10-CM | POA: Diagnosis not present

## 2019-11-01 DIAGNOSIS — Z880 Allergy status to penicillin: Secondary | ICD-10-CM | POA: Insufficient documentation

## 2019-11-01 DIAGNOSIS — Z888 Allergy status to other drugs, medicaments and biological substances status: Secondary | ICD-10-CM | POA: Diagnosis not present

## 2019-11-01 DIAGNOSIS — R499 Unspecified voice and resonance disorder: Secondary | ICD-10-CM

## 2019-11-01 DIAGNOSIS — K2950 Unspecified chronic gastritis without bleeding: Secondary | ICD-10-CM | POA: Diagnosis not present

## 2019-11-01 DIAGNOSIS — K3189 Other diseases of stomach and duodenum: Secondary | ICD-10-CM

## 2019-11-01 DIAGNOSIS — Z882 Allergy status to sulfonamides status: Secondary | ICD-10-CM | POA: Diagnosis not present

## 2019-11-01 DIAGNOSIS — R05 Cough: Secondary | ICD-10-CM

## 2019-11-01 DIAGNOSIS — Z881 Allergy status to other antibiotic agents status: Secondary | ICD-10-CM | POA: Diagnosis not present

## 2019-11-01 DIAGNOSIS — I10 Essential (primary) hypertension: Secondary | ICD-10-CM | POA: Diagnosis not present

## 2019-11-01 HISTORY — PX: BRAVO PH STUDY: SHX5421

## 2019-11-01 HISTORY — PX: BIOPSY: SHX5522

## 2019-11-01 HISTORY — PX: ESOPHAGOGASTRODUODENOSCOPY (EGD) WITH PROPOFOL: SHX5813

## 2019-11-01 LAB — POCT I-STAT, CHEM 8
BUN: 25 mg/dL — ABNORMAL HIGH (ref 8–23)
Calcium, Ion: 1.14 mmol/L — ABNORMAL LOW (ref 1.15–1.40)
Chloride: 105 mmol/L (ref 98–111)
Creatinine, Ser: 0.8 mg/dL (ref 0.44–1.00)
Glucose, Bld: 92 mg/dL (ref 70–99)
HCT: 44 % (ref 36.0–46.0)
Hemoglobin: 15 g/dL (ref 12.0–15.0)
Potassium: 3.6 mmol/L (ref 3.5–5.1)
Sodium: 143 mmol/L (ref 135–145)
TCO2: 29 mmol/L (ref 22–32)

## 2019-11-01 SURGERY — ESOPHAGOGASTRODUODENOSCOPY (EGD) WITH PROPOFOL
Anesthesia: Monitor Anesthesia Care

## 2019-11-01 MED ORDER — LIDOCAINE 2% (20 MG/ML) 5 ML SYRINGE
INTRAMUSCULAR | Status: DC | PRN
  Administered 2019-11-01 (×2): 100 mg via INTRAVENOUS

## 2019-11-01 MED ORDER — PROPOFOL 10 MG/ML IV BOLUS
INTRAVENOUS | Status: AC
  Filled 2019-11-01: qty 20

## 2019-11-01 MED ORDER — PROPOFOL 500 MG/50ML IV EMUL
INTRAVENOUS | Status: DC | PRN
  Administered 2019-11-01: 120 ug/kg/min via INTRAVENOUS

## 2019-11-01 MED ORDER — SODIUM CHLORIDE 0.9 % IV SOLN: INTRAVENOUS | Status: DC

## 2019-11-01 MED ORDER — PROPOFOL 10 MG/ML IV BOLUS
INTRAVENOUS | Status: DC | PRN
  Administered 2019-11-01 (×4): 20 mg via INTRAVENOUS

## 2019-11-01 MED ORDER — LACTATED RINGERS IV SOLN
INTRAVENOUS | Status: DC
  Administered 2019-11-01: 1000 mL via INTRAVENOUS

## 2019-11-01 MED ORDER — PROPOFOL 500 MG/50ML IV EMUL
INTRAVENOUS | Status: AC
  Filled 2019-11-01: qty 50

## 2019-11-01 SURGICAL SUPPLY — 15 items

## 2019-11-01 NOTE — Transfer of Care (Signed)
Immediate Anesthesia Transfer of Care Note  Patient: Tracey Boyd  Procedure(s) Performed: ESOPHAGOGASTRODUODENOSCOPY (EGD) WITH PROPOFOL (N/A ) BRAVO PH STUDY (N/A ) BIOPSY  Patient Location: Endoscopy Unit  Anesthesia Type:MAC  Level of Consciousness: drowsy  Airway & Oxygen Therapy: Patient Spontanous Breathing and Patient connected to face mask oxygen  Post-op Assessment: Report given to RN and Post -op Vital signs reviewed and stable  Post vital signs: Reviewed and stable  Last Vitals:  Vitals Value Taken Time  BP    Temp    Pulse    Resp    SpO2      Last Pain:  Vitals:   11/01/19 0837  PainSc: 0-No pain         Complications: No apparent anesthesia complications

## 2019-11-01 NOTE — Op Note (Signed)
[facility] Patient Name: Tracey Boyd Procedure Date: 11/01/2019 MRN: [id number] Attending MD: Estill Cotta. Loletha Carrow , MD Date of Birth: 1951-07-03 CSN: [id number] Age: 69 Admit Type: Outpatient Procedure:                Upper GI endoscopy Indications:              Chronic cough (no typical GERD symptons, cough not                            improved with high dose PPI) - patient off PPI for                            this study Providers:                Mallie Mussel L. Loletha Carrow, MD, Cleda Daub, RN, Elspeth Cho Tech., Technician, Danley Danker, CRNA Referring MD:             Janith Lima, MD Medicines:                Monitored Anesthesia Care Complications:            No immediate complications. Estimated Blood Loss:     Estimated blood loss was minimal. Procedure:                Pre-Anesthesia Assessment:                           - Prior to the procedure, a History and Physical                            was performed, and patient medications and                            allergies were reviewed. The patient's tolerance of                            previous anesthesia was also reviewed. The risks                            and benefits of the procedure and the sedation                            options and risks were discussed with the patient.                            All questions were answered, and informed consent                            was obtained. Prior Anticoagulants: The patient has                            taken no previous anticoagulant or antiplatelet  agents. ASA Grade Assessment: II - A patient with                            mild systemic disease. After reviewing the risks                            and benefits, the patient was deemed in                            satisfactory condition to undergo the procedure.                           After obtaining informed consent, the endoscope was                          passed under direct vision. Throughout the                            procedure, the patient's blood pressure, pulse, and                            oxygen saturations were monitored continuously. The                            GIF-H190 ([id number]) Olympus gastroscope was                            introduced through the mouth, and advanced to the                            second part of duodenum. The upper GI endoscopy was                            accomplished without difficulty. The patient                            tolerated the procedure well. Scope In: Scope Out: Findings:      The larynx was normal.      The esophagus was normal. The BRAVO capsule with delivery system was       introduced through the mouth and advanced into the esophagus, such that       the BRAVO pH capsule was positioned 35 cm from the incisors, which was 6       cm proximal to the GE junction. The BRAVO pH capsule was then deployed       and attached to the esophageal mucosa. The delivery system was then       withdrawn. Endoscopy was utilized for probe placement and diagnostic       evaluation. The scope was reinserted to evaluate placement of the BRAVO       capsule. Visualization showed the BRAVO capsule to be in an appropriate       position.      There is no endoscopic evidence of Barrett's esophagus, esophagitis or       hiatal hernia in the entire esophagus.      Two localized  diminutive erosions with no stigmata of recent bleeding       were found on the greater curvature of the gastric body. Biopsies were       taken with a cold forceps for histology. (S[place] protocol).      The exam of the stomach was otherwise normal.      The cardia and gastric fundus were normal on retroflexion. (Hill grade 1)      The examined duodenum was normal. Impression:               - Normal larynx.                           - Normal esophagus.                           - Erosive gastropathy with  no stigmata of recent                            bleeding. Biopsied.                           - Normal examined duodenum.                           - The BRAVO pH capsule was deployed. Moderate Sedation:      MAC sedation used Recommendation:           - Patient has a contact number available for                            emergencies. The signs and symptoms of potential                            delayed complications were discussed with the                            patient. Return to normal activities tomorrow.                            Written discharge instructions were provided to the                            patient.                           - Resume previous diet.                           - Continue present medications.                           - Await pathology results.                           - Patient will be contacted with results of pH                            study. Procedure  Code(s):        --- Professional ---                           [phone], Esophagogastroduodenoscopy, flexible,                            transoral; with biopsy, single or multiple                           91035, Esophagus, gastroesophageal reflux test;                            with mucosal attached telemetry pH electrode                            placement, recording, analysis and interpretation Diagnosis Code(s):        --- Professional ---                           K31.89, Other diseases of stomach and duodenum                           R05, Cough CPT copyright 2019 [employer]. All rights reserved. The codes documented in this report are preliminary and upon coder review may  be revised to meet current compliance requirements. Wandra Babin L. Loletha Carrow, MD 11/01/2019 10:57:16 AM This report has been signed electronically. Number of Addenda: 0

## 2019-11-01 NOTE — Anesthesia Postprocedure Evaluation (Signed)
Anesthesia Post Note  Patient: Analisse Randle  Procedure(s) Performed: ESOPHAGOGASTRODUODENOSCOPY (EGD) WITH PROPOFOL (N/A ) BRAVO PH STUDY (N/A ) BIOPSY     Patient location during evaluation: Endoscopy Anesthesia Type: MAC Level of consciousness: awake Pain management: pain level controlled Vital Signs Assessment: post-procedure vital signs reviewed and stable Respiratory status: spontaneous breathing Cardiovascular status: stable Postop Assessment: no apparent nausea or vomiting Anesthetic complications: no    Last Vitals:  Vitals:   11/01/19 1115 11/01/19 1120  BP: 111/79 121/81  Pulse: 74 77  Resp: 17 17  Temp:    SpO2: 93% 91%    Last Pain:  Vitals:   11/01/19 1115  TempSrc:   PainSc: 0-No pain                 Mickelle Goupil

## 2019-11-01 NOTE — Interval H&P Note (Signed)
History and Physical Interval Note:  11/01/2019 9:22 AM  Tracey Boyd  has presented today for surgery, with the diagnosis of Chronic cough.  The various methods of treatment have been discussed with the patient and family. After consideration of risks, benefits and other options for treatment, the patient has consented to  Procedure(s): ESOPHAGOGASTRODUODENOSCOPY (EGD) WITH PROPOFOL (N/A) BRAVO PH STUDY (N/A) as a surgical intervention.  The patient's history has been reviewed, patient examined, no change in status, stable for surgery.  I have reviewed the patient's chart and labs.  Questions were answered to the patient's satisfaction.     Nelida Meuse III

## 2019-11-01 NOTE — Anesthesia Preprocedure Evaluation (Addendum)
Anesthesia Evaluation  Patient identified by MRN, date of birth, ID band  Reviewed: Allergy & Precautions, NPO status , Patient's Chart, lab work & pertinent test results  Airway Mallampati: II  TM Distance: >3 FB     Dental   Pulmonary asthma ,    breath sounds clear to auscultation       Cardiovascular hypertension,  Rhythm:Regular Rate:Normal     Neuro/Psych Anxiety    GI/Hepatic GERD  ,  Endo/Other  Hypothyroidism   Renal/GU      Musculoskeletal   Abdominal   Peds  Hematology  (+) anemia ,   Anesthesia Other Findings   Reproductive/Obstetrics                             Anesthesia Physical Anesthesia Plan  ASA: III  Anesthesia Plan: MAC   Post-op Pain Management:    Induction:   PONV Risk Score and Plan: 2 and Propofol infusion  Airway Management Planned: Nasal Cannula and Simple Face Mask  Additional Equipment:   Intra-op Plan:   Post-operative Plan:   Informed Consent: I have reviewed the patients History and Physical, chart, labs and discussed the procedure including the risks, benefits and alternatives for the proposed anesthesia with the patient or authorized representative who has indicated his/her understanding and acceptance.     Dental advisory given  Plan Discussed with: CRNA and Anesthesiologist  Anesthesia Plan Comments:         Anesthesia Quick Evaluation

## 2019-11-01 NOTE — Discharge Instructions (Signed)
YOU HAD AN ENDOSCOPIC PROCEDURE TODAY: Refer to the procedure report and other information in the discharge instructions given to you for any specific questions about what was found during the examination. If this information does not answer your questions, please call [facility] office at [phone] to clarify.  ° °YOU SHOULD EXPECT: Some feelings of bloating in the abdomen. Passage of more gas than usual. Walking can help get rid of the air that was put into your GI tract during the procedure and reduce the bloating. If you had a lower endoscopy (such as a colonoscopy or flexible sigmoidoscopy) you may notice spotting of blood in your stool or on the toilet paper. Some abdominal soreness may be present for a day or two, also. ° °DIET: Your first meal following the procedure should be a light meal and then it is ok to progress to your normal diet. A half-sandwich or bowl of soup is an example of a good first meal. Heavy or fried foods are harder to digest and may make you feel nauseous or bloated. Drink plenty of fluids but you should avoid alcoholic beverages for 24 hours. If you had a esophageal dilation, please see attached instructions for diet.   ° °ACTIVITY: Your care partner should take you home directly after the procedure. You should plan to take it easy, moving slowly for the rest of the day. You can resume normal activity the day after the procedure however YOU SHOULD NOT DRIVE, use power tools, machinery or perform tasks that involve climbing or major physical exertion for 24 hours (because of the sedation medicines used during the test).  ° °SYMPTOMS TO REPORT IMMEDIATELY: °A gastroenterologist can be reached at any hour. Please call [phone]  for any of the following symptoms:  °Following lower endoscopy (colonoscopy, flexible sigmoidoscopy) °Excessive amounts of blood in the stool  °Significant tenderness, worsening of abdominal pains  °Swelling of the abdomen that is new, acute  °Fever of 100° or  higher  °Following upper endoscopy (EGD, EUS, ERCP, esophageal dilation) °Vomiting of blood or coffee ground material  °New, significant abdominal pain  °New, significant chest pain or pain under the shoulder blades  °Painful or persistently difficult swallowing  °New shortness of breath  °Black, tarry-looking or red, bloody stools ° °FOLLOW UP:  °If any biopsies were taken you will be contacted by phone or by letter within the next 1-3 weeks. Call [phone]  if you have not heard about the biopsies in 3 weeks.  °Please also call with any specific questions about appointments or follow up tests. ° °

## 2019-11-01 NOTE — H&P (Signed)
History:  This patient presents for endoscopic testing for chronic cough.  Tracey Boyd Referring physician: Janith Lima, MD  Past Medical History: Past Medical History:  Diagnosis Date  . Allergy   . Anemia    as a child  . Anxiety   . Asthma   . Dermatitis herpetiformis   . GERD (gastroesophageal reflux disease)   . HTN (hypertension)   . Hypothyroidism   . Osteoporosis      Past Surgical History: Past Surgical History:  Procedure Laterality Date  . APPENDECTOMY    . COLONOSCOPY    . CYSTECTOMY    . ORIF WRIST FRACTURE Left 11/08/2016   Procedure: OPEN REDUCTION INTERNAL FIXATION (ORIF) LEFT WRIST FRACTURE;  Surgeon: Roseanne Kaufman, MD;  Location: [facility];  Service: Orthopedics;  Laterality: Left;    Allergies: Allergies  Allergen Reactions  . Ace Inhibitors Cough    Reaction to lisinopril  . Penicillins Hives    Has patient had a PCN reaction causing immediate rash, facial/tongue/throat swelling, SOB or lightheadedness with hypotension: Yes Has patient had a PCN reaction causing severe rash involving mucus membranes or skin necrosis: No Has patient had a PCN reaction that required hospitalization No Has patient had a PCN reaction occurring within the last 10 years: No If all of the above answers are "NO", then may proceed with Cephalosporin use.  . Sulfa Antibiotics Hives  . Sulfonamide Derivatives Hives  . Tetracycline     Outpatient Meds: Current Facility-Administered Medications  Medication Dose Route Frequency Provider Last Rate Last Admin  . 0.9 %  sodium chloride infusion   Intravenous Continuous Danis, Estill Cotta III, MD      . lactated ringers infusion   Intravenous Continuous Nelida Meuse III, MD 10 mL/hr at 11/01/19 0901 1,000 mL at 11/01/19 0901      ___________________________________________________________________ Objective   Exam:  BP (!) 152/93   Pulse 79   Resp 16   Ht 5\' 3"  (1.6 m)   Wt 63.5 kg   SpO2 99%   BMI 24.80 kg/m     CV: RRR without murmur, S1/S2, no JVD, no peripheral edema  Resp: clear to auscultation bilaterally, normal RR and effort noted  GI: soft, no tenderness, with active bowel sounds. No guarding or palpable organomegaly noted.  Neuro: awake, alert and oriented x 3. Normal gross motor function and fluent speech   Assessment:  Cough - ? GERD  Plan:  EGD with Bravo pH device placement   Nelida Meuse III

## 2019-11-01 NOTE — Anesthesia Procedure Notes (Signed)
Date/Time: 11/01/2019 10:30 AM Performed by: Sharlette Dense, CRNA Oxygen Delivery Method: Simple face mask

## 2019-11-03 LAB — SURGICAL PATHOLOGY

## 2019-11-04 ENCOUNTER — Encounter: Payer: Self-pay | Admitting: *Deleted

## 2019-11-12 ENCOUNTER — Other Ambulatory Visit: Payer: Self-pay | Admitting: Nurse Practitioner

## 2019-12-30 DIAGNOSIS — R69 Illness, unspecified: Secondary | ICD-10-CM | POA: Diagnosis not present

## 2020-01-05 DIAGNOSIS — R69 Illness, unspecified: Secondary | ICD-10-CM | POA: Diagnosis not present

## 2020-03-06 DIAGNOSIS — L720 Epidermal cyst: Secondary | ICD-10-CM | POA: Diagnosis not present

## 2020-03-06 DIAGNOSIS — D1801 Hemangioma of skin and subcutaneous tissue: Secondary | ICD-10-CM | POA: Diagnosis not present

## 2020-03-06 DIAGNOSIS — L821 Other seborrheic keratosis: Secondary | ICD-10-CM | POA: Diagnosis not present

## 2020-03-06 DIAGNOSIS — L718 Other rosacea: Secondary | ICD-10-CM | POA: Diagnosis not present

## 2020-04-12 DIAGNOSIS — R69 Illness, unspecified: Secondary | ICD-10-CM | POA: Diagnosis not present

## 2020-04-19 DIAGNOSIS — L720 Epidermal cyst: Secondary | ICD-10-CM | POA: Diagnosis not present

## 2020-04-19 DIAGNOSIS — L812 Freckles: Secondary | ICD-10-CM | POA: Diagnosis not present

## 2020-04-19 DIAGNOSIS — L821 Other seborrheic keratosis: Secondary | ICD-10-CM | POA: Diagnosis not present

## 2020-04-19 DIAGNOSIS — D1801 Hemangioma of skin and subcutaneous tissue: Secondary | ICD-10-CM | POA: Diagnosis not present

## 2020-04-27 DIAGNOSIS — R69 Illness, unspecified: Secondary | ICD-10-CM | POA: Diagnosis not present

## 2020-06-08 DIAGNOSIS — R69 Illness, unspecified: Secondary | ICD-10-CM | POA: Diagnosis not present

## 2020-08-07 ENCOUNTER — Other Ambulatory Visit: Payer: Self-pay | Admitting: Internal Medicine

## 2020-08-07 DIAGNOSIS — K219 Gastro-esophageal reflux disease without esophagitis: Secondary | ICD-10-CM

## 2020-09-14 DIAGNOSIS — Z01419 Encounter for gynecological examination (general) (routine) without abnormal findings: Secondary | ICD-10-CM | POA: Diagnosis not present

## 2020-09-14 DIAGNOSIS — Z1231 Encounter for screening mammogram for malignant neoplasm of breast: Secondary | ICD-10-CM | POA: Diagnosis not present

## 2020-09-14 DIAGNOSIS — Z6825 Body mass index (BMI) 25.0-25.9, adult: Secondary | ICD-10-CM | POA: Diagnosis not present

## 2020-10-02 ENCOUNTER — Telehealth: Payer: Self-pay | Admitting: Internal Medicine

## 2020-10-02 NOTE — Telephone Encounter (Signed)
I see the pt had one in 2013. Is she a candidate for Shingrix?

## 2020-10-02 NOTE — Telephone Encounter (Signed)
Patient called and was wondering if it was time for her to get the Shingles shot. Please call back at [phone]

## 2020-10-03 NOTE — Telephone Encounter (Signed)
yes

## 2020-10-03 NOTE — Telephone Encounter (Signed)
Called pt, LVM informing her that she can get the newer version of the shingles vaccine and to call the office to be scheduled for a nurse visit.

## 2020-10-12 ENCOUNTER — Telehealth: Payer: Self-pay | Admitting: Internal Medicine

## 2020-10-12 NOTE — Telephone Encounter (Signed)
LVM to schedule appt

## 2020-10-12 NOTE — Telephone Encounter (Signed)
Patient wondering if it was time for her shingles vaccine. [phone]

## 2020-12-12 ENCOUNTER — Telehealth: Payer: Self-pay | Admitting: Internal Medicine

## 2020-12-12 NOTE — Telephone Encounter (Signed)
Pt stated that she would like to start the Shingrix vaccine. I informed her that she is overdue for an OV. Her last one was in 2020. She stated that she would check her schedule and call back to make an appointment.

## 2020-12-12 NOTE — Telephone Encounter (Signed)
Patient wondering if she needs to get her shingles shot

## 2020-12-12 NOTE — Telephone Encounter (Signed)
Patient has questions about getting shingles vaccine. She is unsure if she got 1st in the office of local pharmacy  Seeking advice  Please call

## 2020-12-19 ENCOUNTER — Telehealth: Payer: Self-pay | Admitting: Internal Medicine

## 2020-12-19 NOTE — Telephone Encounter (Signed)
1.Medication Requested: albuterol (PROAIR HFA) 108 (90 Base) MCG/ACT inhaler  budesonide-formoterol (SYMBICORT) 160-4.5 MCG/ACT inhaler    2. Pharmacy (Name, [facility]): [facility] [place] [facility] [address] [place]  3. On Med List: yes   4. Last Visit with PCP: 07-07-19  5. Next visit date with PCP: 01-11-21   Agent: Please be advised that RX refills may take up to 3 business days. We ask that you follow-up with your pharmacy.

## 2020-12-21 NOTE — Telephone Encounter (Signed)
Denied. Pt needs an OV. PCP will not send in meds for pt he has not seen within a year.   She is also aware that she needed an OV from telephone encounter 12/12/20.

## 2020-12-21 NOTE — Telephone Encounter (Signed)
Attempted to call patient to make aware, left VM

## 2020-12-21 NOTE — Telephone Encounter (Signed)
Patient wondering if they can be sent in today they leave for a week cruise on saturday

## 2021-01-04 ENCOUNTER — Telehealth: Payer: Self-pay | Admitting: Internal Medicine

## 2021-01-04 NOTE — Telephone Encounter (Signed)
LVM for pt to rtn my call to schedule AWV with [facility]. Please schedule if pt calls the office.  ?

## 2021-01-10 ENCOUNTER — Other Ambulatory Visit: Payer: Self-pay

## 2021-01-11 ENCOUNTER — Encounter: Payer: Self-pay | Admitting: Internal Medicine

## 2021-01-11 ENCOUNTER — Ambulatory Visit (INDEPENDENT_AMBULATORY_CARE_PROVIDER_SITE_OTHER): Payer: Medicare Other | Admitting: Internal Medicine

## 2021-01-11 ENCOUNTER — Other Ambulatory Visit: Payer: Self-pay

## 2021-01-11 VITALS — BP 144/92 | HR 80 | Temp 97.9°F | Resp 16 | Ht 64.75 in | Wt 146.0 lb

## 2021-01-11 DIAGNOSIS — M8000XD Age-related osteoporosis with current pathological fracture, unspecified site, subsequent encounter for fracture with routine healing: Secondary | ICD-10-CM | POA: Diagnosis not present

## 2021-01-11 DIAGNOSIS — I10 Essential (primary) hypertension: Secondary | ICD-10-CM | POA: Diagnosis not present

## 2021-01-11 DIAGNOSIS — Z0000 Encounter for general adult medical examination without abnormal findings: Secondary | ICD-10-CM | POA: Diagnosis not present

## 2021-01-11 DIAGNOSIS — J4540 Moderate persistent asthma, uncomplicated: Secondary | ICD-10-CM | POA: Diagnosis not present

## 2021-01-11 DIAGNOSIS — E2839 Other primary ovarian failure: Secondary | ICD-10-CM | POA: Diagnosis not present

## 2021-01-11 DIAGNOSIS — E039 Hypothyroidism, unspecified: Secondary | ICD-10-CM | POA: Diagnosis not present

## 2021-01-11 DIAGNOSIS — Z1231 Encounter for screening mammogram for malignant neoplasm of breast: Secondary | ICD-10-CM

## 2021-01-11 DIAGNOSIS — K219 Gastro-esophageal reflux disease without esophagitis: Secondary | ICD-10-CM

## 2021-01-11 DIAGNOSIS — Z23 Encounter for immunization: Secondary | ICD-10-CM | POA: Diagnosis not present

## 2021-01-11 DIAGNOSIS — E785 Hyperlipidemia, unspecified: Secondary | ICD-10-CM

## 2021-01-11 LAB — VITAMIN D 25 HYDROXY (VIT D DEFICIENCY, FRACTURES): VITD: 60.38 ng/mL (ref 30.00–100.00)

## 2021-01-11 LAB — URINALYSIS, ROUTINE W REFLEX MICROSCOPIC
Bilirubin Urine: NEGATIVE
Hgb urine dipstick: NEGATIVE
Ketones, ur: NEGATIVE
Nitrite: NEGATIVE
RBC / HPF: NONE SEEN (ref 0–?)
Specific Gravity, Urine: <=1.005 — AB (ref 1.000–1.030)
Total Protein, Urine: NEGATIVE
Urine Glucose: NEGATIVE
Urobilinogen, UA: 0.2 (ref 0.0–1.0)
pH: 5.5 (ref 5.0–8.0)

## 2021-01-11 LAB — BASIC METABOLIC PANEL
BUN: 16 mg/dL (ref 6–23)
CO2: 29 mEq/L (ref 19–32)
Calcium: 9.5 mg/dL (ref 8.4–10.5)
Chloride: 105 mEq/L (ref 96–112)
Creatinine, Ser: 0.89 mg/dL (ref 0.40–1.20)
GFR: 65.95 mL/min (ref >60.00)
Glucose, Bld: 94 mg/dL (ref 70–99)
Potassium: 3.7 mEq/L (ref 3.5–5.1)
Sodium: 142 mEq/L (ref 135–145)

## 2021-01-11 LAB — LIPID PANEL
Cholesterol: 186 mg/dL (ref 0–200)
HDL: 61.6 mg/dL (ref >39.00)
LDL Cholesterol: 107 mg/dL — ABNORMAL HIGH (ref 0–99)
NonHDL: 124.51
Total CHOL/HDL Ratio: 3
Triglycerides: 90 mg/dL (ref 0.0–149.0)
VLDL: 18 mg/dL (ref 0.0–40.0)

## 2021-01-11 LAB — HEPATIC FUNCTION PANEL
ALT: 28 U/L (ref 0–35)
AST: 29 U/L (ref 0–37)
Albumin: 4.7 g/dL (ref 3.5–5.2)
Alkaline Phosphatase: 50 U/L (ref 39–117)
Bilirubin, Direct: 0.1 mg/dL (ref 0.0–0.3)
Total Bilirubin: 0.7 mg/dL (ref 0.2–1.2)
Total Protein: 7.9 g/dL (ref 6.0–8.3)

## 2021-01-11 LAB — CBC WITH DIFFERENTIAL/PLATELET
Basophils Absolute: 0.1 10*3/uL (ref 0.0–0.1)
Basophils Relative: 1.1 % (ref 0.0–3.0)
Eosinophils Absolute: 0.4 10*3/uL (ref 0.0–0.7)
Eosinophils Relative: 8 % — ABNORMAL HIGH (ref 0.0–5.0)
HCT: 43.7 % (ref 36.0–46.0)
Hemoglobin: 14.7 g/dL (ref 12.0–15.0)
Lymphocytes Relative: 27.1 % (ref 12.0–46.0)
Lymphs Abs: 1.4 10*3/uL (ref 0.7–4.0)
MCHC: 33.7 g/dL (ref 30.0–36.0)
MCV: 91.6 fl (ref 78.0–100.0)
Monocytes Absolute: 0.5 10*3/uL (ref 0.1–1.0)
Monocytes Relative: 8.9 % (ref 3.0–12.0)
Neutro Abs: 2.8 10*3/uL (ref 1.4–7.7)
Neutrophils Relative %: 54.9 % (ref 43.0–77.0)
Platelets: 230 10*3/uL (ref 150.0–400.0)
RBC: 4.77 Mil/uL (ref 3.87–5.11)
RDW: 13.5 % (ref 11.5–15.5)
WBC: 5.1 10*3/uL (ref 4.0–10.5)

## 2021-01-11 LAB — TSH: TSH: 5.31 u[IU]/mL — ABNORMAL HIGH (ref 0.35–4.50)

## 2021-01-11 MED ORDER — SHINGRIX 50 MCG/0.5ML IM SUSR: 0.5000 mL | Freq: Once | INTRAMUSCULAR | 1 refills | Status: AC

## 2021-01-11 MED ORDER — BUDESONIDE-FORMOTEROL FUMARATE 160-4.5 MCG/ACT IN AERO: 2.0000 | INHALATION_SPRAY | Freq: Two times a day (BID) | RESPIRATORY_TRACT | 1 refills | Status: DC

## 2021-01-11 NOTE — Progress Notes (Signed)
Subjective:  Patient ID: Tracey Boyd, female    DOB: 11-01-1950  Age: 70 y.o. MRN: [id number]  CC: Annual Exam (Patient needs refill of Symbicort), Hypothyroidism, Asthma, and Hypertension  This visit occurred during the SARS-CoV-2 public health emergency.  Safety protocols were in place, including screening questions prior to the visit, additional usage of staff PPE, and extensive cleaning of exam room while observing appropriate contact time as indicated for disinfecting solutions.    HPI Tracey Boyd presents for a CPX and f/up -   She has been using an outdated inhaler of Symbicort to treat her asthma.  She has rare nonproductive cough and wheezing.  She is active and denies any recent episodes of chest pain, dyspnea on exertion, diaphoresis, dizziness, or lightheadedness.  She is not taking any antihypertensives because she says when she does her blood pressure drops too low.  Outpatient Medications Prior to Visit  Medication Sig Dispense Refill  . albuterol (PROAIR HFA) 108 (90 Base) MCG/ACT inhaler Inhale 2 puffs into the lungs every 6 (six) hours as needed for wheezing or shortness of breath. 18 g 3  . alendronate (FOSAMAX) 70 MG tablet Take 70 mg by mouth once a week. Thursday morning    . B Complex-C (SUPER B COMPLEX PO) Take 1 tablet by mouth daily.    . Calcium Carb-Cholecalciferol (CALCIUM/VITAMIN D PO) Take 1,200 mg by mouth daily.     Marland Kitchen Fexofenadine HCl (MUCINEX ALLERGY PO) Take by mouth.    . Multiple Vitamin (MULTIVITAMIN WITH MINERALS) TABS tablet Take 1 tablet by mouth daily. Centrum Silver    . Omega-3 Fatty Acids (FISH OIL CONCENTRATE PO) Take 1,200 mg by mouth daily.     Marland Kitchen Propylene Glycol (SYSTANE BALANCE OP) Place 1 drop into both eyes daily.     Marland Kitchen Respiratory Therapy Supplies (FLUTTER) DEVI Use as directed 1 each 0  . Triamcinolone Acetonide (NASACORT ALLERGY 24HR NA) Place into the nose.    Marland Kitchen VITAMIN D PO Take by mouth.    . budesonide-formoterol (SYMBICORT)  160-4.5 MCG/ACT inhaler Inhale 2 puffs into the lungs 2 (two) times daily. (Patient taking differently: Inhale 2 puffs into the lungs daily.) 3 Inhaler 1  . diphenhydrAMINE HCl (ALLERGY MED PO) Take by mouth.    . levocetirizine (XYZAL) 5 MG tablet Take 1 tablet by mouth at bedtime.     Marland Kitchen levothyroxine (SYNTHROID, LEVOTHROID) 50 MCG tablet Take 50 mcg by mouth daily before breakfast.    . amLODipine (NORVASC) 5 MG tablet Take 5 mg by mouth daily as needed (high blood pressure).  (Patient not taking: Reported on 01/11/2021)     No facility-administered medications prior to visit.    ROS Review of Systems  Constitutional: Positive for fatigue. Negative for chills, diaphoresis and unexpected weight change.  HENT: Negative.   Eyes: Negative.   Respiratory: Positive for cough and wheezing. Negative for chest tightness and shortness of breath.        She has rare NP cough and wheezing  Cardiovascular: Negative for chest pain, palpitations and leg swelling.  Gastrointestinal: Negative for abdominal pain, constipation, diarrhea, nausea and vomiting.  Endocrine: Negative for cold intolerance and heat intolerance.  Genitourinary: Negative.  Negative for difficulty urinating.  Musculoskeletal: Negative.  Negative for arthralgias and myalgias.  Skin: Negative for color change, pallor and rash.  Neurological: Negative.  Negative for dizziness, weakness, light-headedness and headaches.  Hematological: Negative for adenopathy. Does not bruise/bleed easily.  Psychiatric/Behavioral: Negative.     Objective:  BP (!) 144/92 (BP Location: Left Arm, Patient Position: Sitting, Cuff Size: Large)   Pulse 80   Temp 97.9 F (36.6 C) (Oral)   Resp 16   Ht 5' 4.75" (1.645 m)   Wt 146 lb (66.2 kg) Comment: declined  SpO2 97%   BMI 24.48 kg/m   BP Readings from Last 3 Encounters:  01/11/21 (!) 144/92  11/01/19 121/81  08/17/19 138/86    Wt Readings from Last 3 Encounters:  01/11/21 146 lb (66.2 kg)   11/01/19 140 lb (63.5 kg)  10/14/19 147 lb (66.7 kg)    Physical Exam Vitals reviewed.  Constitutional:      Appearance: Normal appearance.  HENT:     Nose: Nose normal.     Mouth/Throat:     Mouth: Mucous membranes are moist.  Eyes:     General: No scleral icterus.    Conjunctiva/sclera: Conjunctivae normal.  Cardiovascular:     Rate and Rhythm: Normal rate and regular rhythm.     Heart sounds: No murmur heard.   Pulmonary:     Effort: Pulmonary effort is normal.     Breath sounds: No stridor. No wheezing, rhonchi or rales.  Abdominal:     General: Abdomen is flat. Bowel sounds are normal. There is no distension.     Palpations: Abdomen is soft. There is no hepatomegaly, splenomegaly or mass.     Tenderness: There is no abdominal tenderness. There is no guarding.  Musculoskeletal:        General: Normal range of motion.     Cervical back: Neck supple.     Right lower leg: No edema.     Left lower leg: No edema.  Lymphadenopathy:     Cervical: No cervical adenopathy.  Skin:    General: Skin is warm and dry.  Neurological:     General: No focal deficit present.     Mental Status: She is alert.     Lab Results  Component Value Date   WBC 5.1 01/11/2021   HGB 14.7 01/11/2021   HCT 43.7 01/11/2021   PLT 230.0 01/11/2021   GLUCOSE 94 01/11/2021   CHOL 186 01/11/2021   TRIG 90.0 01/11/2021   HDL 61.60 01/11/2021   LDLCALC 107 (H) 01/11/2021   ALT 28 01/11/2021   AST 29 01/11/2021   NA 142 01/11/2021   K 3.7 01/11/2021   CL 105 01/11/2021   CREATININE 0.89 01/11/2021   BUN 16 01/11/2021   CO2 29 01/11/2021   TSH 5.31 (H) 01/11/2021    No results found.  Assessment & Plan:   Tracey Boyd was seen today for annual exam, hypothyroidism, asthma and hypertension.  Diagnoses and all orders for this visit:  Essential hypertension- Her blood pressure is not adequately well controlled.  She is not willing to take an antihypertensive. -     CBC with  Differential/Platelet; Future -     Basic metabolic panel; Future -     VITAMIN D 25 Hydroxy (Vit-D Deficiency, Fractures); Future -     TSH; Future -     Urinalysis, Routine w reflex microscopic; Future -     Urinalysis, Routine w reflex microscopic -     TSH -     VITAMIN D 25 Hydroxy (Vit-D Deficiency, Fractures) -     Basic metabolic panel -     CBC with Differential/Platelet  Acquired hypothyroidism- Her TSH is elevated just above 5.  I recommended that she increase the levothyroxine dose to 50 mcg  alternating with 75 mcg. -     TSH; Future -     TSH -     levothyroxine (SYNTHROID) 50 MCG tablet; Take 1 tablet (50 mcg total) by mouth every other day. -     levothyroxine (SYNTHROID) 75 MCG tablet; Take 1 tablet (75 mcg total) by mouth every other day.  Gastroesophageal reflux disease without esophagitis- Her symptoms are well controlled with the PPI. -     CBC with Differential/Platelet; Future -     CBC with Differential/Platelet  Routine general medical examination at a health care facility-exam completed, labs reviewed, vaccines reviewed and updated, cancer screenings addressed, patient education was given.  Hyperlipidemia with target LDL less than 130- She does not have an elevated ASCVD risk score so I did not recommend a statin for CV risk reduction. -     Lipid panel; Future -     Hepatic function panel; Future -     Hepatic function panel -     Lipid panel  Visit for screening mammogram -     MM DIGITAL SCREENING BILATERAL; Future  Age-related osteoporosis with current pathological fracture with routine healing, subsequent encounter -     Basic metabolic panel; Future -     VITAMIN D 25 Hydroxy (Vit-D Deficiency, Fractures); Future -     DG Bone Density; Future -     VITAMIN D 25 Hydroxy (Vit-D Deficiency, Fractures) -     Basic metabolic panel  Asthma, moderate persistent, well-controlled -     budesonide-formoterol (SYMBICORT) 160-4.5 MCG/ACT inhaler; Inhale 2  puffs into the lungs 2 (two) times daily.  Estrogen deficiency -     DG Bone Density; Future  Need for shingles vaccine -     Zoster Vaccine Adjuvanted [facility]) injection; Inject 0.5 mLs into the muscle once for 1 dose.  Other orders -     Tdap vaccine greater than or equal to 7yo IM   I have discontinued Pamala [place]'s levocetirizine, amLODipine, and diphenhydrAMINE HCl (ALLERGY MED PO). I have also changed her levothyroxine. Additionally, I am having her start on Shingrix and levothyroxine. Lastly, I am having her maintain her Flutter, multivitamin with minerals, Propylene Glycol (SYSTANE BALANCE OP), Omega-3 Fatty Acids (FISH OIL CONCENTRATE PO), albuterol, alendronate, Calcium Carb-Cholecalciferol (CALCIUM/VITAMIN D PO), B Complex-C (SUPER B COMPLEX PO), Triamcinolone Acetonide (NASACORT ALLERGY 24HR NA), VITAMIN D PO, Fexofenadine HCl (MUCINEX ALLERGY PO), and budesonide-formoterol.  Meds ordered this encounter  Medications  . budesonide-formoterol (SYMBICORT) 160-4.5 MCG/ACT inhaler    Sig: Inhale 2 puffs into the lungs 2 (two) times daily.    Dispense:  3 each    Refill:  1  . Zoster Vaccine Adjuvanted [facility] injection    Sig: Inject 0.5 mLs into the muscle once for 1 dose.    Dispense:  0.5 mL    Refill:  1  . levothyroxine (SYNTHROID) 50 MCG tablet    Sig: Take 1 tablet (50 mcg total) by mouth every other day.    Dispense:  90 tablet    Refill:  0  . levothyroxine (SYNTHROID) 75 MCG tablet    Sig: Take 1 tablet (75 mcg total) by mouth every other day.    Dispense:  90 tablet    Refill:  0     Follow-up: Return in about 6 months (around 07/14/2021).  Scarlette Calico, MD

## 2021-01-11 NOTE — Progress Notes (Signed)
Pt has been informed that the recommended vaccine TDAP, may not be covered under their current Medicare insurance. Discussed that they will possibly incur a bill for the TDAP vaccine & administration fee.  Pt understands that if they want the TDAP vaccine, Medicare will be billed for an official decision on payment, which will be sent to them in a Medicare Summary Notice (MSN). They understand that if Medicare does not pay, they are responsible for payment.     

## 2021-01-11 NOTE — Patient Instructions (Signed)
Health Maintenance, Female Adopting a healthy lifestyle and getting preventive care are important in promoting health and wellness. Ask your health care provider about:  The right schedule for you to have regular tests and exams.  Things you can do on your own to prevent diseases and keep yourself healthy. What should I know about diet, weight, and exercise? Eat a healthy diet  Eat a diet that includes plenty of vegetables, fruits, low-fat dairy products, and lean protein.  Do not eat a lot of foods that are high in solid fats, added sugars, or sodium.   Maintain a healthy weight Body mass index (BMI) is used to identify weight problems. It estimates body fat based on height and weight. Your health care provider can help determine your BMI and help you achieve or maintain a healthy weight. Get regular exercise Get regular exercise. This is one of the most important things you can do for your health. Most adults should:  Exercise for at least 150 minutes each week. The exercise should increase your heart rate and make you sweat (moderate-intensity exercise).  Do strengthening exercises at least twice a week. This is in addition to the moderate-intensity exercise.  Spend less time sitting. Even light physical activity can be beneficial. Watch cholesterol and blood lipids Have your blood tested for lipids and cholesterol at 70 years of age, then have this test every 5 years. Have your cholesterol levels checked more often if:  Your lipid or cholesterol levels are high.  You are older than 70 years of age.  You are at high risk for heart disease. What should I know about cancer screening? Depending on your health history and family history, you may need to have cancer screening at various ages. This may include screening for:  Breast cancer.  Cervical cancer.  Colorectal cancer.  Skin cancer.  Lung cancer. What should I know about heart disease, diabetes, and high blood  pressure? Blood pressure and heart disease  High blood pressure causes heart disease and increases the risk of stroke. This is more likely to develop in people who have high blood pressure readings, are of African descent, or are overweight.  Have your blood pressure checked: ? Every 3-5 years if you are 18-39 years of age. ? Every year if you are 40 years old or older. Diabetes Have regular diabetes screenings. This checks your fasting blood sugar level. Have the screening done:  Once every three years after age 40 if you are at a normal weight and have a low risk for diabetes.  More often and at a younger age if you are overweight or have a high risk for diabetes. What should I know about preventing infection? Hepatitis B If you have a higher risk for hepatitis B, you should be screened for this virus. Talk with your health care provider to find out if you are at risk for hepatitis B infection. Hepatitis C Testing is recommended for:  Everyone born from 1945 through 1965.  Anyone with known risk factors for hepatitis C. Sexually transmitted infections (STIs)  Get screened for STIs, including gonorrhea and chlamydia, if: ? You are sexually active and are younger than 70 years of age. ? You are older than 70 years of age and your health care provider tells you that you are at risk for this type of infection. ? Your sexual activity has changed since you were last screened, and you are at increased risk for chlamydia or gonorrhea. Ask your health care provider   if you are at risk.  Ask your health care provider about whether you are at high risk for HIV. Your health care provider may recommend a prescription medicine to help prevent HIV infection. If you choose to take medicine to prevent HIV, you should first get tested for HIV. You should then be tested every 3 months for as long as you are taking the medicine. Pregnancy  If you are about to stop having your period (premenopausal) and  you may become pregnant, seek counseling before you get pregnant.  Take 400 to 800 micrograms (mcg) of folic acid every day if you become pregnant.  Ask for birth control (contraception) if you want to prevent pregnancy. Osteoporosis and menopause Osteoporosis is a disease in which the bones lose minerals and strength with aging. This can result in bone fractures. If you are 65 years old or older, or if you are at risk for osteoporosis and fractures, ask your health care provider if you should:  Be screened for bone loss.  Take a calcium or vitamin D supplement to lower your risk of fractures.  Be given hormone replacement therapy (HRT) to treat symptoms of menopause. Follow these instructions at home: Lifestyle  Do not use any products that contain nicotine or tobacco, such as cigarettes, e-cigarettes, and chewing tobacco. If you need help quitting, ask your health care provider.  Do not use street drugs.  Do not share needles.  Ask your health care provider for help if you need support or information about quitting drugs. Alcohol use  Do not drink alcohol if: ? Your health care provider tells you not to drink. ? You are pregnant, may be pregnant, or are planning to become pregnant.  If you drink alcohol: ? Limit how much you use to 0-1 drink a day. ? Limit intake if you are breastfeeding.  Be aware of how much alcohol is in your drink. In the [place] one drink equals one 12 oz bottle of beer (355 mL), one 5 oz glass of wine (148 mL), or one 1 oz glass of hard liquor (44 mL). General instructions  Schedule regular health, dental, and eye exams.  Stay current with your vaccines.  Tell your health care provider if: ? You often feel depressed. ? You have ever been abused or do not feel safe at home. Summary  Adopting a healthy lifestyle and getting preventive care are important in promoting health and wellness.  Follow your health care provider's instructions about healthy  diet, exercising, and getting tested or screened for diseases.  Follow your health care provider's instructions on monitoring your cholesterol and blood pressure. This information is not intended to replace advice given to you by your health care provider. Make sure you discuss any questions you have with your health care provider. Document Revised: 08/19/2018 Document Reviewed: 08/19/2018 Elsevier Patient Education  2021 Elsevier Inc.  

## 2021-01-12 ENCOUNTER — Encounter: Payer: Self-pay | Admitting: Internal Medicine

## 2021-01-12 MED ORDER — LEVOTHYROXINE SODIUM 75 MCG PO TABS
75.0000 ug | ORAL_TABLET | ORAL | 0 refills | Status: DC
Start: 2021-01-12 — End: 2021-07-18

## 2021-01-12 MED ORDER — LEVOTHYROXINE SODIUM 50 MCG PO TABS: 50.0000 ug | ORAL_TABLET | ORAL | 0 refills | Status: DC

## 2021-01-15 ENCOUNTER — Ambulatory Visit (INDEPENDENT_AMBULATORY_CARE_PROVIDER_SITE_OTHER)
Admission: RE | Admit: 2021-01-15 | Discharge: 2021-01-15 | Disposition: A | Discharge status: Home / Self Care | Payer: Medicare HMO | Source: Ambulatory Visit | Attending: Internal Medicine | Admitting: Internal Medicine

## 2021-01-15 ENCOUNTER — Other Ambulatory Visit: Payer: Self-pay

## 2021-01-15 DIAGNOSIS — M8000XD Age-related osteoporosis with current pathological fracture, unspecified site, subsequent encounter for fracture with routine healing: Secondary | ICD-10-CM

## 2021-01-15 DIAGNOSIS — E2839 Other primary ovarian failure: Secondary | ICD-10-CM | POA: Diagnosis not present

## 2021-02-20 ENCOUNTER — Telehealth: Payer: Self-pay

## 2021-02-20 NOTE — Telephone Encounter (Signed)
PA started with Cover My Meds:  Key: [id number]

## 2021-02-20 NOTE — Telephone Encounter (Signed)
PA Approved:  T"his approval authorizes your coverage from 09/09/2020 - 09/08/2021, unless we notify you  otherwise, and as long as the following conditions apply: ? you remain enrolled in our Medicare Part D prescription drug plan, ? your physician or other prescriber continues to prescribe the medication for you, and ? the medication continues to be safe for treating your condition.

## 2021-03-26 NOTE — Telephone Encounter (Signed)
Patient was not aware of her needing the Evenity, she got mail from [place] about it and wanted an update.  She would like to know if she needs to be on this medication and if someone can talk to her about this medication.  Wants to know if her bone density was worse this time and if this is why she is needing this medication.   Please advise- [phone]

## 2021-03-26 NOTE — Telephone Encounter (Signed)
° ° °  Please return call to patient °

## 2021-03-26 NOTE — Telephone Encounter (Signed)
Called pt, LVM.   

## 2021-03-26 NOTE — Telephone Encounter (Signed)
Pt has been informed and expressed understanding. She would like to know does the benefits outweigh the side effects and warnings she seen online? She also would like the Rx sent to [facility] on [address].

## 2021-03-26 NOTE — Telephone Encounter (Signed)
Please advise. I do not see any notations for the DEXA scan in May from PCP or an outgoing letter in regard.

## 2021-03-27 NOTE — Telephone Encounter (Signed)
   Please call patient, requesting status of prior auth

## 2021-04-02 ENCOUNTER — Other Ambulatory Visit: Payer: Self-pay | Admitting: Internal Medicine

## 2021-04-02 ENCOUNTER — Telehealth: Payer: Self-pay | Admitting: Internal Medicine

## 2021-04-02 DIAGNOSIS — M8000XD Age-related osteoporosis with current pathological fracture, unspecified site, subsequent encounter for fracture with routine healing: Secondary | ICD-10-CM

## 2021-04-02 NOTE — Telephone Encounter (Signed)
Please call Tracey Boyd's evenity prescription to   [name] [place] [address] [facility] [place] Phone:  [phone]  Fax:  [phone]     Patient states she is almost done with her fosamex  alendronate (FOSAMAX) 70 MG tablet  Please contact the patient and let her know when script has been called in so she can pick it up. Patient would like to get evenity injection this week if possible

## 2021-04-03 NOTE — Telephone Encounter (Signed)
Pt has been informed and scheduled a nurse visit.

## 2021-04-04 ENCOUNTER — Other Ambulatory Visit: Payer: Self-pay

## 2021-04-04 ENCOUNTER — Ambulatory Visit (INDEPENDENT_AMBULATORY_CARE_PROVIDER_SITE_OTHER): Payer: Medicare HMO

## 2021-04-04 DIAGNOSIS — M8000XD Age-related osteoporosis with current pathological fracture, unspecified site, subsequent encounter for fracture with routine healing: Secondary | ICD-10-CM | POA: Diagnosis not present

## 2021-04-04 MED ORDER — ROMOSOZUMAB-AQQG 105 MG/1.17ML ~~LOC~~ SOSY
210.0000 mg | PREFILLED_SYRINGE | SUBCUTANEOUS | Status: DC
  Administered 2021-04-04 – 2021-07-16 (×2): 210 mg via SUBCUTANEOUS

## 2021-04-04 NOTE — Progress Notes (Signed)
Pt here for monthly Evenity injection per Dr Ronnald Ramp.  Evenity '105mg'$  given subcutaneous in right arm and pt tolerated injection well.  Next Evenity injection scheduled for 05/07/21.

## 2021-04-19 DIAGNOSIS — D1801 Hemangioma of skin and subcutaneous tissue: Secondary | ICD-10-CM | POA: Diagnosis not present

## 2021-04-19 DIAGNOSIS — L821 Other seborrheic keratosis: Secondary | ICD-10-CM | POA: Diagnosis not present

## 2021-04-19 DIAGNOSIS — L820 Inflamed seborrheic keratosis: Secondary | ICD-10-CM | POA: Diagnosis not present

## 2021-04-19 DIAGNOSIS — L570 Actinic keratosis: Secondary | ICD-10-CM | POA: Diagnosis not present

## 2021-04-19 DIAGNOSIS — L304 Erythema intertrigo: Secondary | ICD-10-CM | POA: Diagnosis not present

## 2021-04-19 DIAGNOSIS — L812 Freckles: Secondary | ICD-10-CM | POA: Diagnosis not present

## 2021-05-07 ENCOUNTER — Ambulatory Visit: Payer: Medicare HMO

## 2021-05-10 ENCOUNTER — Ambulatory Visit (INDEPENDENT_AMBULATORY_CARE_PROVIDER_SITE_OTHER): Payer: Medicare HMO

## 2021-05-10 ENCOUNTER — Other Ambulatory Visit: Payer: Self-pay

## 2021-05-10 DIAGNOSIS — M8000XD Age-related osteoporosis with current pathological fracture, unspecified site, subsequent encounter for fracture with routine healing: Secondary | ICD-10-CM

## 2021-05-10 MED ORDER — ROMOSOZUMAB-AQQG 105 MG/1.17ML ~~LOC~~ SOSY
210.0000 mg | PREFILLED_SYRINGE | Freq: Once | SUBCUTANEOUS | Status: AC
Start: 2021-05-10 — End: 2021-05-10
  Administered 2021-05-10: 210 mg via SUBCUTANEOUS

## 2021-05-10 NOTE — Progress Notes (Signed)
Pt given evenity injection w/o ny complications.

## 2021-06-07 ENCOUNTER — Telehealth: Payer: Self-pay

## 2021-06-07 NOTE — Telephone Encounter (Signed)
Evenity VOB initiated via [email or web address] 

## 2021-06-13 ENCOUNTER — Ambulatory Visit (INDEPENDENT_AMBULATORY_CARE_PROVIDER_SITE_OTHER): Payer: Medicare HMO

## 2021-06-13 ENCOUNTER — Other Ambulatory Visit: Payer: Self-pay

## 2021-06-13 DIAGNOSIS — M8000XD Age-related osteoporosis with current pathological fracture, unspecified site, subsequent encounter for fracture with routine healing: Secondary | ICD-10-CM | POA: Diagnosis not present

## 2021-06-13 MED ORDER — ROMOSOZUMAB-AQQG 105 MG/1.17ML ~~LOC~~ SOSY
210.0000 mg | PREFILLED_SYRINGE | Freq: Once | SUBCUTANEOUS | Status: AC
  Administered 2021-06-13: 210 mg via SUBCUTANEOUS

## 2021-06-13 NOTE — Telephone Encounter (Signed)
Prior Auth required for [employer]   PA PROCESS DETAILS: PA is required. Please call [phone], or fax [phone].

## 2021-06-13 NOTE — Telephone Encounter (Signed)
Pt ready for scheduling on or after 06/10/21  Out-of-pocket cost due at time of visit: $425  Primary: [employer] Evenity co-insurance: 20% (approximately $390) Admin fee co-insurance: $35  Secondary: n/a Evenity co-insurance:  Admin fee co-insurance:   Deductible: does not apply  Prior Auth: APPROVED PA# Key: [id number] Valid: 09/09/20-09/08/21   ** This summary of benefits is an estimation of the patient's out-of-pocket cost. Exact cost may vary based on individual plan coverage.

## 2021-06-13 NOTE — Progress Notes (Signed)
Evenity injection w/o any complications.

## 2021-06-18 ENCOUNTER — Ambulatory Visit (INDEPENDENT_AMBULATORY_CARE_PROVIDER_SITE_OTHER): Payer: Medicare HMO

## 2021-06-18 ENCOUNTER — Ambulatory Visit: Payer: Medicare HMO | Admitting: Podiatry

## 2021-06-18 ENCOUNTER — Other Ambulatory Visit: Payer: Self-pay

## 2021-06-18 ENCOUNTER — Encounter: Payer: Self-pay | Admitting: Podiatry

## 2021-06-18 DIAGNOSIS — M2061 Acquired deformities of toe(s), unspecified, right foot: Secondary | ICD-10-CM | POA: Diagnosis not present

## 2021-06-18 DIAGNOSIS — M21611 Bunion of right foot: Secondary | ICD-10-CM

## 2021-06-18 NOTE — Progress Notes (Signed)
  Subjective:  Patient ID: Tracey Boyd, female    DOB: 1951/08/06,   MRN: [id number]  No chief complaint on file.   70 y.o. female presents for right foot bunion and callus on right big tow. States the toe has been moving. This has been present for several years and has been worsening, denies any pain. Wondering what she can do to prevent issues.   . Denies any other pedal complaints. Denies n/v/f/c.   Past Medical History:  Diagnosis Date   Allergy    Anemia    as a child   Anxiety    Asthma    Dermatitis herpetiformis    GERD (gastroesophageal reflux disease)    HTN (hypertension)    Hypothyroidism    Osteoporosis     Objective:  Physical Exam: Vascular: DP/PT pulses 2/4 bilateral. CFT <3 seconds. Normal hair growth on digits. No edema.  Skin. No lacerations or abrasions bilateral feet.  Musculoskeletal: MMT 5/5 bilateral lower extremities in DF, PF, Inversion and Eversion. Deceased ROM in DF of ankle joint. 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 No tenderness to palpation.  Neurological: Sensation intact to light touch.   Assessment:   1. Acquired deformity of right toe      Plan:  Patient was evaluated and treated and all questions answered. -Xrays reviewed. 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  -Discussed HAV and treatment options;conservative and surgical management; risks, benefits, alternatives discussed. All patient's questions answered. -Discussed padding and wide shoe gear.   -Recommend continue with good supportive shoes and inserts.  -Discussed surgical options, but as patient does not have pain do not recommend surgery at this time.  -Patient to return to office as needed or sooner if condition worsens.   Lorenda Peck, DPM

## 2021-06-21 DIAGNOSIS — J45909 Unspecified asthma, uncomplicated: Secondary | ICD-10-CM | POA: Diagnosis not present

## 2021-06-21 DIAGNOSIS — M810 Age-related osteoporosis without current pathological fracture: Secondary | ICD-10-CM | POA: Diagnosis not present

## 2021-06-21 DIAGNOSIS — Z7983 Long term (current) use of bisphosphonates: Secondary | ICD-10-CM | POA: Diagnosis not present

## 2021-06-21 DIAGNOSIS — Z880 Allergy status to penicillin: Secondary | ICD-10-CM | POA: Diagnosis not present

## 2021-06-21 DIAGNOSIS — R030 Elevated blood-pressure reading, without diagnosis of hypertension: Secondary | ICD-10-CM | POA: Diagnosis not present

## 2021-06-21 DIAGNOSIS — E039 Hypothyroidism, unspecified: Secondary | ICD-10-CM | POA: Diagnosis not present

## 2021-06-21 DIAGNOSIS — Z7722 Contact with and (suspected) exposure to environmental tobacco smoke (acute) (chronic): Secondary | ICD-10-CM | POA: Diagnosis not present

## 2021-07-16 ENCOUNTER — Other Ambulatory Visit: Payer: Self-pay

## 2021-07-16 ENCOUNTER — Encounter: Payer: Self-pay | Admitting: Internal Medicine

## 2021-07-16 ENCOUNTER — Ambulatory Visit (INDEPENDENT_AMBULATORY_CARE_PROVIDER_SITE_OTHER): Payer: Medicare HMO | Admitting: Internal Medicine

## 2021-07-16 VITALS — BP 124/82 | HR 74 | Temp 98.6°F | Resp 16 | Ht 64.75 in | Wt 140.0 lb

## 2021-07-16 DIAGNOSIS — E039 Hypothyroidism, unspecified: Secondary | ICD-10-CM | POA: Diagnosis not present

## 2021-07-16 DIAGNOSIS — Z1231 Encounter for screening mammogram for malignant neoplasm of breast: Secondary | ICD-10-CM

## 2021-07-16 DIAGNOSIS — I10 Essential (primary) hypertension: Secondary | ICD-10-CM

## 2021-07-16 DIAGNOSIS — M8000XD Age-related osteoporosis with current pathological fracture, unspecified site, subsequent encounter for fracture with routine healing: Secondary | ICD-10-CM

## 2021-07-16 LAB — BASIC METABOLIC PANEL
BUN: 23 mg/dL (ref 6–23)
CO2: 29 mEq/L (ref 19–32)
Calcium: 9.3 mg/dL (ref 8.4–10.5)
Chloride: 105 mEq/L (ref 96–112)
Creatinine, Ser: 0.89 mg/dL (ref 0.40–1.20)
GFR: 65.71 mL/min (ref >60.00)
Glucose, Bld: 107 mg/dL — ABNORMAL HIGH (ref 70–99)
Potassium: 4 mEq/L (ref 3.5–5.1)
Sodium: 141 mEq/L (ref 135–145)

## 2021-07-16 LAB — VITAMIN D 25 HYDROXY (VIT D DEFICIENCY, FRACTURES): VITD: 44.15 ng/mL (ref 30.00–100.00)

## 2021-07-16 LAB — TSH: TSH: 3.07 u[IU]/mL (ref 0.35–5.50)

## 2021-07-16 NOTE — Patient Instructions (Signed)

## 2021-07-16 NOTE — Progress Notes (Signed)
Subjective:  Patient ID: Tracey Boyd, female    DOB: 10-17-1950  Age: 70 y.o. MRN: [id number]  CC: Hypothyroidism  This visit occurred during the SARS-CoV-2 public health emergency.  Safety protocols were in place, including screening questions prior to the visit, additional usage of staff PPE, and extensive cleaning of exam room while observing appropriate contact time as indicated for disinfecting solutions.    HPI Tracey Boyd presents for f/up -   She has rare wheezing. She denies CP, DOE, diaphoresis, edema, fatigue.  Outpatient Medications Prior to Visit  Medication Sig Dispense Refill   albuterol (PROAIR HFA) 108 (90 Base) MCG/ACT inhaler Inhale 2 puffs into the lungs every 6 (six) hours as needed for wheezing or shortness of breath. 18 g 3   B Complex-C (SUPER B COMPLEX PO) Take 1 tablet by mouth daily.     budesonide-formoterol (SYMBICORT) 160-4.5 MCG/ACT inhaler Inhale 2 puffs into the lungs 2 (two) times daily. 3 each 1   Calcium Carb-Cholecalciferol (CALCIUM/VITAMIN D PO) Take 1,200 mg by mouth daily.      calcium carbonate (OSCAL) 1500 (600 Ca) MG TABS tablet Take by mouth.     Fexofenadine HCl (MUCINEX ALLERGY PO) Take by mouth.     ketoconazole (NIZORAL) 2 % cream ketoconazole 2 % topical cream  APPLY TO THE AFFECTED AREA(S) BY TOPICAL ROUTE ONCE DAILY     mometasone (ELOCON) 0.1 % cream Apply topically.     Multiple Vitamin (MULTIVITAMIN WITH MINERALS) TABS tablet Take 1 tablet by mouth daily. Centrum Silver     Omega-3 Fatty Acids (FISH OIL CONCENTRATE PO) Take 1,200 mg by mouth daily.      Propylene Glycol (SYSTANE BALANCE OP) Place 1 drop into both eyes daily.      Respiratory Therapy Supplies (FLUTTER) DEVI Use as directed 1 each 0   SHINGRIX injection      Triamcinolone Acetonide (NASACORT ALLERGY 24HR NA) Place into the nose.     VITAMIN D PO Take by mouth.     alendronate (FOSAMAX) 70 MG tablet Take 70 mg by mouth once a week. Thursday morning      levothyroxine (SYNTHROID) 50 MCG tablet Take 1 tablet (50 mcg total) by mouth every other day. 90 tablet 0   levothyroxine (SYNTHROID) 75 MCG tablet Take 1 tablet (75 mcg total) by mouth every other day. 90 tablet 0   Romosozumab-aqqg (EVENITY) 105 MG/1.17ML injection 210 mg      No facility-administered medications prior to visit.    ROS Review of Systems  Constitutional:  Negative for chills, diaphoresis and fatigue.  HENT: Negative.    Eyes: Negative.   Respiratory:  Positive for wheezing. Negative for cough, choking and shortness of breath.   Cardiovascular:  Negative for chest pain, palpitations and leg swelling.  Gastrointestinal:  Negative for abdominal pain, constipation, diarrhea, nausea and vomiting.  Endocrine: Negative for cold intolerance and heat intolerance.  Genitourinary: Negative.  Negative for difficulty urinating and hematuria.  Musculoskeletal:  Positive for arthralgias. Negative for myalgias.  Skin: Negative.   Neurological:  Negative for dizziness, weakness, light-headedness and numbness.  Hematological:  Negative for adenopathy. Does not bruise/bleed easily.  Psychiatric/Behavioral: Negative.     Objective:  BP 124/82 (BP Location: Right Arm, Patient Position: Sitting, Cuff Size: Large)   Pulse 74   Temp 98.6 F (37 C) (Oral)   Resp 16   Ht 5' 4.75" (1.645 m)   Wt 140 lb (63.5 kg) Comment: declined  SpO2 95%  BMI 23.48 kg/m   BP Readings from Last 3 Encounters:  07/16/21 124/82  01/11/21 (!) 144/92  11/01/19 121/81    Wt Readings from Last 3 Encounters:  07/16/21 140 lb (63.5 kg)  01/11/21 146 lb (66.2 kg)  11/01/19 140 lb (63.5 kg)    Physical Exam Vitals reviewed.  Constitutional:      Appearance: Normal appearance.  HENT:     Nose: Nose normal.     Mouth/Throat:     Mouth: Mucous membranes are moist.  Eyes:     Conjunctiva/sclera: Conjunctivae normal.  Cardiovascular:     Rate and Rhythm: Normal rate and regular rhythm.     Heart  sounds: No murmur heard. Pulmonary:     Effort: Pulmonary effort is normal.     Breath sounds: No stridor. No wheezing, rhonchi or rales.  Abdominal:     General: Abdomen is flat. Bowel sounds are normal. There is no distension.     Palpations: Abdomen is soft. There is no hepatomegaly, splenomegaly or mass.     Tenderness: There is no abdominal tenderness.  Musculoskeletal:        General: Normal range of motion.     Cervical back: Neck supple.     Right lower leg: No edema.     Left lower leg: No edema.  Lymphadenopathy:     Cervical: No cervical adenopathy.  Skin:    General: Skin is warm and dry.  Neurological:     General: No focal deficit present.     Mental Status: She is alert.  Psychiatric:        Mood and Affect: Mood normal.        Behavior: Behavior normal.    Lab Results  Component Value Date   WBC 5.1 01/11/2021   HGB 14.7 01/11/2021   HCT 43.7 01/11/2021   PLT 230.0 01/11/2021   GLUCOSE 107 (H) 07/16/2021   CHOL 186 01/11/2021   TRIG 90.0 01/11/2021   HDL 61.60 01/11/2021   LDLCALC 107 (H) 01/11/2021   ALT 28 01/11/2021   AST 29 01/11/2021   NA 141 07/16/2021   K 4.0 07/16/2021   CL 105 07/16/2021   CREATININE 0.89 07/16/2021   BUN 23 07/16/2021   CO2 29 07/16/2021   TSH 3.07 07/16/2021    DG Bone Density  Result Date: 01/21/2021 Date of study: 01/15/21 Exam: DUAL X-RAY ABSORPTIOMETRY (DXA) FOR BONE MINERAL DENSITY (BMD) Instrument: [employer] Provider: PCP Indication: screening for osteoporosis Comparison: none (please note that it is not possible to compare data from different instruments) Clinical data: Pt is a 70 y.o. female with history of fracture. Currently taking alendronate Results:  Lumbar spine L1-L4 Femoral neck (FN) 33% distal radius T-score -1.7 RFN: -2.6 LFN: -2.7 n/a Change in BMD from previous DXA test (%) n/a n/a n/a (*) statistically significant Assessment: Patient has OSTEOPOROSIS according to the [facility] classification  for osteoporosis (see below). Fracture risk: high Comments: the technical quality of the study is good  L3 is excluded from the spine score due to degenerative change Evaluation for secondary causes should be considered if clinically indicated. Recommend optimizing calcium (1200 mg/day) and vitamin D (800 IU/day). Treatment is indicated. Followup: Repeat BMD is appropriate after 2 years or after 1-2 years if starting treatment. WHO criteria for diagnosis of osteoporosis in postmenopausal women and in men 4 y/o or older: - normal: T-score -1.0 to + 1.0 - osteopenia/low bone density: T-score between -2.5 and -1.0 - osteoporosis:  T-score below -2.5 - severe osteoporosis: T-score below -2.5 with history of fragility fracture Note: although not part of the WHO classification, the presence of a fragility fracture, regardless of the T-score, should be considered diagnostic of osteoporosis, provided other causes for the fracture have been excluded. Treatment: The National Osteoporosis Foundation recommends that treatment be considered in postmenopausal women and men age 29 or older with: 1. Hip or vertebral (clinical or morphometric) fracture 2. T-score of - 2.5 or lower at the spine or hip 3. 10-year fracture probability by FRAX of at least 20% for a major osteoporotic fracture and 3% for a hip fracture Loura Pardon MD    Assessment & Plan:   Tracey Boyd was seen today for hypothyroidism.  Diagnoses and all orders for this visit:  Essential hypertension- Her blood pressure is adequately well controlled. -     Basic metabolic panel; Future -     Basic metabolic panel  Acquired hypothyroidism- Her TSH is in the normal range.  She will stay on the current T4 dosage. -     TSH; Future -     TSH -     levothyroxine (SYNTHROID) 50 MCG tablet; Take 1 tablet (50 mcg total) by mouth every other day. -     levothyroxine (SYNTHROID) 75 MCG tablet; Take 1 tablet (75 mcg total) by mouth every other day.  Age-related  osteoporosis with current pathological fracture with routine healing, subsequent encounter- Her renal function, calcium level, and vitamin D are normal.  Will continue monthly Evenity injections. -     Basic metabolic panel; Future -     VITAMIN D 25 Hydroxy (Vit-D Deficiency, Fractures); Future -     VITAMIN D 25 Hydroxy (Vit-D Deficiency, Fractures) -     Basic metabolic panel  Visit for screening mammogram -     MM DIGITAL SCREENING BILATERAL; Future  I have discontinued Gabriela Gawron's alendronate. I am also having her maintain her Flutter, multivitamin with minerals, Propylene Glycol (SYSTANE BALANCE OP), Omega-3 Fatty Acids (FISH OIL CONCENTRATE PO), albuterol, Calcium Carb-Cholecalciferol (CALCIUM/VITAMIN D PO), B Complex-C (SUPER B COMPLEX PO), Triamcinolone Acetonide (NASACORT ALLERGY 24HR NA), VITAMIN D PO, Fexofenadine HCl (MUCINEX ALLERGY PO), budesonide-formoterol, calcium carbonate, ketoconazole, mometasone, Shingrix, levothyroxine, and levothyroxine. We will stop administering Romosozumab-aqqg. Additionally, we administered Romosozumab-aqqg.  Meds ordered this encounter  Medications   levothyroxine (SYNTHROID) 50 MCG tablet    Sig: Take 1 tablet (50 mcg total) by mouth every other day.    Dispense:  90 tablet    Refill:  0   levothyroxine (SYNTHROID) 75 MCG tablet    Sig: Take 1 tablet (75 mcg total) by mouth every other day.    Dispense:  90 tablet    Refill:  0      Follow-up: Return in about 6 months (around 01/13/2022).  Scarlette Calico, MD

## 2021-07-17 ENCOUNTER — Telehealth: Payer: Self-pay | Admitting: Internal Medicine

## 2021-07-17 NOTE — Telephone Encounter (Signed)
Patient calling in  Patient reviewing over her MyChart & there are 3 immunizations that are missing that she would like to update  Shingrix 1 01/11/21 Shingrix 2 03/27/21 ([facility])  Mammogram 09/14/20  [place] 05/28/21 ([facility])

## 2021-07-18 MED ORDER — LEVOTHYROXINE SODIUM 75 MCG PO TABS
75.0000 ug | ORAL_TABLET | ORAL | 0 refills | Status: DC
Start: 2021-07-18 — End: 2021-07-23

## 2021-07-18 MED ORDER — LEVOTHYROXINE SODIUM 50 MCG PO TABS: 50.0000 ug | ORAL_TABLET | ORAL | 0 refills | Status: DC

## 2021-07-19 NOTE — Telephone Encounter (Signed)
Pt past due for Evenity inj #3/12, due 07/16/21.  Please contact pt to schedule.

## 2021-07-20 MED ORDER — ROMOSOZUMAB-AQQG 105 MG/1.17ML ~~LOC~~ SOSY
210.0000 mg | PREFILLED_SYRINGE | Freq: Once | SUBCUTANEOUS | Status: AC
  Administered 2021-07-20: 210 mg via SUBCUTANEOUS

## 2021-07-20 NOTE — Addendum Note (Signed)
Addended by: Hinda Kehr on: 07/20/2021 09:16 AM   Modules accepted: Orders

## 2021-07-23 ENCOUNTER — Other Ambulatory Visit: Payer: Self-pay | Admitting: Internal Medicine

## 2021-07-23 ENCOUNTER — Telehealth: Payer: Self-pay | Admitting: Internal Medicine

## 2021-07-23 DIAGNOSIS — E039 Hypothyroidism, unspecified: Secondary | ICD-10-CM

## 2021-07-23 MED ORDER — LEVOTHYROXINE SODIUM 50 MCG PO TABS
50.0000 ug | ORAL_TABLET | Freq: Every day | ORAL | 1 refills | Status: DC
Start: 2021-07-23 — End: 2021-12-10

## 2021-07-23 NOTE — Telephone Encounter (Signed)
Patient requesting a call back to discuss why provider changed rx to levothyroxine (SYNTHROID) 75 MCG tablet

## 2021-07-23 NOTE — Telephone Encounter (Signed)
Pt stated that she was not alternating levothyroxine. She was only taking 70mcg daily. Today was the first time she heard that she should be alternating doses. PT would like to know should she continue taking 41mcg daily since her lab work was in range or start alternating? Please advise.

## 2021-07-23 NOTE — Telephone Encounter (Signed)
Pt has been informed and expressed understanding.  

## 2021-08-15 ENCOUNTER — Other Ambulatory Visit: Payer: Self-pay

## 2021-08-15 ENCOUNTER — Ambulatory Visit (INDEPENDENT_AMBULATORY_CARE_PROVIDER_SITE_OTHER): Payer: Medicare HMO

## 2021-08-15 DIAGNOSIS — M8000XD Age-related osteoporosis with current pathological fracture, unspecified site, subsequent encounter for fracture with routine healing: Secondary | ICD-10-CM | POA: Diagnosis not present

## 2021-08-15 MED ORDER — ROMOSOZUMAB-AQQG 105 MG/1.17ML ~~LOC~~ SOSY
210.0000 mg | PREFILLED_SYRINGE | Freq: Once | SUBCUTANEOUS | Status: AC
  Administered 2021-08-15: 210 mg via SUBCUTANEOUS

## 2021-08-15 NOTE — Progress Notes (Signed)
Pt was given Evenity injections w/o any complications.

## 2021-08-16 NOTE — Telephone Encounter (Signed)
Evenity Injection Schedule Inj #1  Inj #2  Inj #3 - 08/15/21 Inj #4 - scheduled 09/19/21 Inj #5  Inj #6  Inj #7  Inj #8  Inj #9  Inj #10  Inj #11  Inj #12

## 2021-09-03 ENCOUNTER — Other Ambulatory Visit: Payer: Self-pay | Admitting: Internal Medicine

## 2021-09-03 DIAGNOSIS — J4540 Moderate persistent asthma, uncomplicated: Secondary | ICD-10-CM

## 2021-09-12 ENCOUNTER — Ambulatory Visit: Payer: Medicare HMO

## 2021-09-19 ENCOUNTER — Other Ambulatory Visit: Payer: Self-pay

## 2021-09-19 ENCOUNTER — Ambulatory Visit (INDEPENDENT_AMBULATORY_CARE_PROVIDER_SITE_OTHER): Payer: Medicare HMO

## 2021-09-19 DIAGNOSIS — M8000XD Age-related osteoporosis with current pathological fracture, unspecified site, subsequent encounter for fracture with routine healing: Secondary | ICD-10-CM

## 2021-09-19 MED ORDER — ROMOSOZUMAB-AQQG 105 MG/1.17ML ~~LOC~~ SOSY
210.0000 mg | PREFILLED_SYRINGE | Freq: Once | SUBCUTANEOUS | Status: AC
  Administered 2021-09-19: 210 mg via SUBCUTANEOUS

## 2021-09-19 NOTE — Progress Notes (Signed)
Pt given Evenity injection w/o any complications.

## 2021-09-25 NOTE — Telephone Encounter (Signed)
Prior auth required for [employer]  PA PROCESS DETAILS: PA is required and is currently not on file. Please call Medical Review at [phone] to initiate PA.

## 2021-10-06 ENCOUNTER — Telehealth: Payer: Medicare HMO | Admitting: Emergency Medicine

## 2021-10-06 DIAGNOSIS — U071 COVID-19: Secondary | ICD-10-CM | POA: Diagnosis not present

## 2021-10-06 MED ORDER — NIRMATRELVIR/RITONAVIR (PAXLOVID)TABLET: 3.0000 | ORAL_TABLET | Freq: Two times a day (BID) | ORAL | 0 refills | Status: AC

## 2021-10-06 NOTE — Patient Instructions (Signed)
Peterson Ao, thank you for joining [name], PA-C for today's virtual visit.  While this provider is not your primary care provider (PCP), if your PCP is located in our provider database this encounter information will be shared with them immediately following your visit.  Consent: (Patient) Tracey Boyd provided verbal consent for this virtual visit at the beginning of the encounter.  Current Medications:  Current Outpatient Medications:    nirmatrelvir/ritonavir EUA (PAXLOVID) 20 x 150 MG & 10 x 100MG  TABS, Take 3 tablets by mouth 2 (two) times daily for 5 days. (Take nirmatrelvir 150 mg two tablets twice daily for 5 days and ritonavir 100 mg one tablet twice daily for 5 days) Patient GFR is 65, Disp: 30 tablet, Rfl: 0   albuterol (PROAIR HFA) 108 (90 Base) MCG/ACT inhaler, Inhale 2 puffs into the lungs every 6 (six) hours as needed for wheezing or shortness of breath., Disp: 18 g, Rfl: 3   B Complex-C (SUPER B COMPLEX PO), Take 1 tablet by mouth daily., Disp: , Rfl:    Calcium Carb-Cholecalciferol (CALCIUM/VITAMIN D PO), Take 1,200 mg by mouth daily. , Disp: , Rfl:    calcium carbonate (OSCAL) 1500 (600 Ca) MG TABS tablet, Take by mouth., Disp: , Rfl:    Fexofenadine HCl (MUCINEX ALLERGY PO), Take by mouth., Disp: , Rfl:    ketoconazole (NIZORAL) 2 % cream, ketoconazole 2 % topical cream  APPLY TO THE AFFECTED AREA(S) BY TOPICAL ROUTE ONCE DAILY, Disp: , Rfl:    levothyroxine (SYNTHROID) 50 MCG tablet, Take 1 tablet (50 mcg total) by mouth daily before breakfast., Disp: 90 tablet, Rfl: 1   mometasone (ELOCON) 0.1 % cream, Apply topically., Disp: , Rfl:    Multiple Vitamin (MULTIVITAMIN WITH MINERALS) TABS tablet, Take 1 tablet by mouth daily. Centrum Silver, Disp: , Rfl:    Omega-3 Fatty Acids (FISH OIL CONCENTRATE PO), Take 1,200 mg by mouth daily. , Disp: , Rfl:    Propylene Glycol (SYSTANE BALANCE OP), Place 1 drop into both eyes daily. , Disp: , Rfl:    Respiratory Therapy  Supplies (FLUTTER) DEVI, Use as directed, Disp: 1 each, Rfl: 0   SHINGRIX injection, , Disp: , Rfl:    SYMBICORT 160-4.5 MCG/ACT inhaler, INHALE 2 PUFFS INTO THE LUNGS TWICE DAILY, Disp: 30.6 g, Rfl: 1   Triamcinolone Acetonide (NASACORT ALLERGY 24HR NA), Place into the nose., Disp: , Rfl:    VITAMIN D PO, Take by mouth., Disp: , Rfl:    Medications ordered in this encounter:  Meds ordered this encounter  Medications   nirmatrelvir/ritonavir EUA (PAXLOVID) 20 x 150 MG & 10 x 100MG  TABS    Sig: Take 3 tablets by mouth 2 (two) times daily for 5 days. (Take nirmatrelvir 150 mg two tablets twice daily for 5 days and ritonavir 100 mg one tablet twice daily for 5 days) Patient GFR is 65    Dispense:  30 tablet    Refill:  0    Order Specific Question:   Supervising Provider    Answer:   Noemi Chapel [[id number]]     *If you need refills on other medications prior to your next appointment, please contact your pharmacy*  Follow-Up: Call back or seek an in-person evaluation if the symptoms worsen or if the condition fails to improve as anticipated.  Other Instructions COVID test was positive You should remain isolated in your home for 5 days from symptom onset AND greater than 72 hours after symptoms resolution (absence of fever without  the use of fever-reducing medication and improvement in respiratory symptoms), whichever is longer Get plenty of rest and push fluids Use zyrtec for nasal congestion, runny nose, and/or sore throat Use flonase for nasal congestion and runny nose Use medications daily for symptom relief Paxlovid prescribed.  Take as directed and to completion Use OTC medications like ibuprofen or tylenol as needed fever or pain Follow up with PCP in 1-2 days via phone or e-visit for recheck and to ensure symptoms are improving Call or go to the ED if you have any new or worsening symptoms such as fever, worsening cough, shortness of breath, chest tightness, chest pain, turning blue,  changes in mental status, etc...     If you have been instructed to have an in-person evaluation today at a local Urgent Care facility, please use the link below. It will take you to a list of all of our available [facility], including address, phone number and hours of operation. Please do not delay care.  [facility]  If you or a family member do not have a primary care provider, use the link below to schedule a visit and establish care. When you choose a [facility] primary care physician or advanced practice provider, you gain a long-term partner in health. Find a Primary Care Provider  Learn more about [facility] [facility]'s in-office and virtual care options: [facility]

## 2021-10-06 NOTE — Progress Notes (Signed)
Virtual Visit Consent   Tracey Boyd, you are scheduled for a virtual visit with a [facility] provider today.     Just as with appointments in the office, your consent must be obtained to participate.  Your consent will be active for this visit and any virtual visit you Tracey have with one of our providers in the next 365 days.     If you have a MyChart account, a copy of this consent can be sent to you electronically.  All virtual visits are billed to your insurance company just like a traditional visit in the office.    As this is a virtual visit, video technology does not allow for your provider to perform a traditional examination.  This Tracey limit your provider's ability to fully assess your condition.  If your provider identifies any concerns that need to be evaluated in person or the need to arrange testing (such as labs, EKG, etc.), we will make arrangements to do so.     Although advances in technology are sophisticated, we cannot ensure that it will always work on either your end or our end.  If the connection with a video visit is poor, the visit Tracey have to be switched to a telephone visit.  With either a video or telephone visit, we are not always able to ensure that we have a secure connection.     I need to obtain your verbal consent now.   Are you willing to proceed with your visit today? yes   Tracey Boyd has provided verbal consent on 10/06/2021 for a virtual visit (video or telephone).   Tracey Boyd, [place]   Date: 10/06/2021 11:13 AM   Virtual Visit via Video Note   I, Tracey Boyd, connected with  Tracey Boyd, Tracey Boyd, 1952) on 10/06/21 at 11:30 AM EST by a video-enabled telemedicine application and verified that I am speaking with the correct person using two identifiers.  Location: Patient: Virtual Visit Location Patient: Home Provider: Virtual Visit Location Provider: Home Office   I discussed the limitations of evaluation and management by  telemedicine and the availability of in person appointments. The patient expressed understanding and agreed to proceed.    History of Present Illness: Tracey Boyd is a 71 y.o. who identifies as a female who was assigned female at birth, and is being seen today for fatigue, congestion, cough and headache that started last night.  Husband has covid.  Patient had a positive home covid test this morning.  Has NOT tried OTC medications.  Denies aggravating factors.  Denies previous covid infection in the past.   Denies fever, chills, rhinorrhea, sore throat, SOB, wheezing, chest pain, nausea, changes in bowel or bladder habits.    ROS: As per HPI.  All other pertinent ROS negative.        HPI: HPI  Problems:  Patient Active Problem List   Diagnosis Date Noted   Estrogen deficiency 07/16/2017   Age-related osteoporosis with current pathological fracture 07/16/2017   Visit for screening mammogram 07/16/2017   Acquired hypothyroidism 07/16/2017   Seasonal allergic rhinitis due to pollen 07/16/2017   Routine general medical examination at a health care facility 03/11/2016   Upper airway cough syndrome 01/24/2015   Hyperlipidemia with target LDL less than 130 08/25/2014   Chronic sinusitis 05/09/2014   Dysphonia 12/21/2013   Laryngopharyngeal reflux 12/21/2013   Nasal polyp, unspecified 12/21/2013   Vocal cord atrophy 12/21/2013   Asthma, moderate persistent, well-controlled 02/25/2011   Essential hypertension  11/04/2007   GERD 11/04/2007    Allergies:  Allergies  Allergen Reactions   Ace Inhibitors Cough    Reaction to lisinopril   Penicillins Hives    Has patient had a PCN reaction causing immediate rash, facial/tongue/throat swelling, SOB or lightheadedness with hypotension: Yes Has patient had a PCN reaction causing severe rash involving mucus membranes or skin necrosis: No Has patient had a PCN reaction that required hospitalization No Has patient had a PCN reaction occurring  within the last 10 years: No If all of the above answers are "NO", then Tracey proceed with Cephalosporin use.   Sulfa Antibiotics Hives   Sulfonamide Derivatives Hives   Tetracycline    Medications:  Current Outpatient Medications:    nirmatrelvir/ritonavir EUA (PAXLOVID) 20 x 150 MG & 10 x 100MG  TABS, Take 3 tablets by mouth 2 (two) times daily for 5 days. (Take nirmatrelvir 150 mg two tablets twice daily for 5 days and ritonavir 100 mg one tablet twice daily for 5 days) Patient GFR is 65, Disp: 30 tablet, Rfl: 0   albuterol (PROAIR HFA) 108 (90 Base) MCG/ACT inhaler, Inhale 2 puffs into the lungs every 6 (six) hours as needed for wheezing or shortness of breath., Disp: 18 g, Rfl: 3   B Complex-C (SUPER B COMPLEX PO), Take 1 tablet by mouth daily., Disp: , Rfl:    Calcium Carb-Cholecalciferol (CALCIUM/VITAMIN D PO), Take 1,200 mg by mouth daily. , Disp: , Rfl:    calcium carbonate (OSCAL) 1500 (600 Ca) MG TABS tablet, Take by mouth., Disp: , Rfl:    Fexofenadine HCl (MUCINEX ALLERGY PO), Take by mouth., Disp: , Rfl:    ketoconazole (NIZORAL) 2 % cream, ketoconazole 2 % topical cream  APPLY TO THE AFFECTED AREA(S) BY TOPICAL ROUTE ONCE DAILY, Disp: , Rfl:    levothyroxine (SYNTHROID) 50 MCG tablet, Take 1 tablet (50 mcg total) by mouth daily before breakfast., Disp: 90 tablet, Rfl: 1   mometasone (ELOCON) 0.1 % cream, Apply topically., Disp: , Rfl:    Multiple Vitamin (MULTIVITAMIN WITH MINERALS) TABS tablet, Take 1 tablet by mouth daily. Centrum Silver, Disp: , Rfl:    Omega-3 Fatty Acids (FISH OIL CONCENTRATE PO), Take 1,200 mg by mouth daily. , Disp: , Rfl:    Propylene Glycol (SYSTANE BALANCE OP), Place 1 drop into both eyes daily. , Disp: , Rfl:    Respiratory Therapy Supplies (FLUTTER) DEVI, Use as directed, Disp: 1 each, Rfl: 0   SHINGRIX injection, , Disp: , Rfl:    SYMBICORT 160-4.5 MCG/ACT inhaler, INHALE 2 PUFFS INTO THE LUNGS TWICE DAILY, Disp: 30.6 g, Rfl: 1   Triamcinolone  Acetonide (NASACORT ALLERGY 24HR NA), Place into the nose., Disp: , Rfl:    VITAMIN D PO, Take by mouth., Disp: , Rfl:   Observations/Objective: Patient is well-developed, well-nourished in no acute distress.  Resting comfortably  at home. Mildly fatigued, but nontoxic Head is normocephalic, atraumatic.  No labored breathing. Speaking in full sentences and tolerating own secretions Speech is clear and coherent with logical content.  Patient is alert and oriented at baseline.   Assessment and Plan: 1. COVID-19 virus infection  COVID test was positive You should remain isolated in your home for 5 days from symptom onset AND greater than 72 hours after symptoms resolution (absence of fever without the use of fever-reducing medication and improvement in respiratory symptoms), whichever is longer Get plenty of rest and push fluids Use zyrtec for nasal congestion, runny nose, and/or sore throat Use  flonase for nasal congestion and runny nose Use medications daily for symptom relief Paxlovid prescribed.  Take as directed and to completion Use OTC medications like ibuprofen or tylenol as needed fever or pain Follow up with PCP in 1-2 days via phone or e-visit for recheck and to ensure symptoms are improving Call or go to the ED if you have any new or worsening symptoms such as fever, worsening cough, shortness of breath, chest tightness, chest pain, turning blue, changes in mental status, etc...    Follow Up Instructions: I discussed the assessment and treatment plan with the patient. The patient was provided an opportunity to ask questions and all were answered. The patient agreed with the plan and demonstrated an understanding of the instructions.  A copy of instructions were sent to the patient via MyChart unless otherwise noted below.   The patient was advised to call back or seek an in-person evaluation if the symptoms worsen or if the condition fails to improve as anticipated.  Time:  I  spent 5-10 minutes with the patient via telehealth technology discussing the above problems/concerns.    Tracey Box, PA-C

## 2021-10-11 NOTE — Telephone Encounter (Signed)
Prior auth pending clinical review Authorization Number : [id number]

## 2021-10-16 DIAGNOSIS — Z20822 Contact with and (suspected) exposure to covid-19: Secondary | ICD-10-CM | POA: Diagnosis not present

## 2021-10-16 DIAGNOSIS — U071 COVID-19: Secondary | ICD-10-CM | POA: Diagnosis not present

## 2021-10-17 NOTE — Telephone Encounter (Signed)
Prior auth for Tracey Boyd

## 2021-10-17 NOTE — Telephone Encounter (Addendum)
Evenity Injection Schedule Inj #1  Inj #2  Inj #3 - 08/15/21 Inj #4 - 09/19/21 Inj #5 - 11/01/21 Inj #6 - 11/29/21 Inj #7 - 01/03/22 Inj #8 - 02/07/22 Inj #9 - scheduled 03/19/22 Inj #10  Inj #11  Inj #12

## 2021-10-22 ENCOUNTER — Ambulatory Visit: Payer: Medicare HMO

## 2021-11-01 ENCOUNTER — Ambulatory Visit: Payer: Medicare HMO

## 2021-11-01 ENCOUNTER — Other Ambulatory Visit: Payer: Self-pay

## 2021-11-29 ENCOUNTER — Ambulatory Visit: Payer: Medicare HMO

## 2021-11-30 ENCOUNTER — Ambulatory Visit (INDEPENDENT_AMBULATORY_CARE_PROVIDER_SITE_OTHER): Payer: Medicare HMO

## 2021-11-30 ENCOUNTER — Other Ambulatory Visit: Payer: Self-pay

## 2021-11-30 DIAGNOSIS — M8000XD Age-related osteoporosis with current pathological fracture, unspecified site, subsequent encounter for fracture with routine healing: Secondary | ICD-10-CM | POA: Diagnosis not present

## 2021-11-30 MED ORDER — ROMOSOZUMAB-AQQG 105 MG/1.17ML ~~LOC~~ SOSY
210.0000 mg | PREFILLED_SYRINGE | Freq: Once | SUBCUTANEOUS | Status: AC
  Administered 2021-11-30: 210 mg via SUBCUTANEOUS

## 2021-11-30 NOTE — Progress Notes (Signed)
Pt here for monthly [place] ? ?B12 1067mg given IM, and pt tolerated injection well. ? ?Next B12 injection scheduled for 12/31/21 ? ?

## 2021-12-08 ENCOUNTER — Other Ambulatory Visit: Payer: Self-pay | Admitting: Internal Medicine

## 2021-12-08 DIAGNOSIS — E039 Hypothyroidism, unspecified: Secondary | ICD-10-CM

## 2021-12-10 ENCOUNTER — Other Ambulatory Visit: Payer: Self-pay | Admitting: Internal Medicine

## 2021-12-10 ENCOUNTER — Telehealth: Payer: Self-pay | Admitting: Internal Medicine

## 2021-12-10 DIAGNOSIS — E039 Hypothyroidism, unspecified: Secondary | ICD-10-CM

## 2021-12-10 MED ORDER — LEVOTHYROXINE SODIUM 75 MCG PO TABS: 75.0000 ug | ORAL_TABLET | Freq: Every day | ORAL | 0 refills | Status: DC

## 2021-12-10 NOTE — Telephone Encounter (Signed)
1.Medication Requested: levothyroxine 75 mcg tablet  ? ?2. Pharmacy (Name, Street, [place]): [facility], [place] to Registered [facility] Sites ? ?3. On Med List: N ? ?4. Last Visit with PCP: 07-16-2021 ? ?5. Next visit date with PCP: n/a ? ?Pt states she was switched to levothyroxine 75 mcg tablet on 07-23-2021 ? ?Advised pt I could only locate levothyroxine 50 mcg tablet  ? ? ?

## 2021-12-10 NOTE — Telephone Encounter (Signed)
Pt has been scheduled for 7/11 @ 10.40am.  ? ?

## 2021-12-12 ENCOUNTER — Emergency Department (HOSPITAL_BASED_OUTPATIENT_CLINIC_OR_DEPARTMENT_OTHER)
Admission: EM | Admit: 2021-12-12 | Discharge: 2021-12-12 | Disposition: A | Discharge status: Home / Self Care | Payer: Medicare HMO | Attending: Emergency Medicine | Admitting: Emergency Medicine

## 2021-12-12 ENCOUNTER — Encounter (HOSPITAL_BASED_OUTPATIENT_CLINIC_OR_DEPARTMENT_OTHER): Payer: Self-pay | Admitting: Emergency Medicine

## 2021-12-12 ENCOUNTER — Other Ambulatory Visit: Payer: Self-pay

## 2021-12-12 ENCOUNTER — Emergency Department (HOSPITAL_BASED_OUTPATIENT_CLINIC_OR_DEPARTMENT_OTHER): Payer: Medicare HMO

## 2021-12-12 DIAGNOSIS — W231XXA Caught, crushed, jammed, or pinched between stationary objects, initial encounter: Secondary | ICD-10-CM | POA: Insufficient documentation

## 2021-12-12 DIAGNOSIS — S8992XA Unspecified injury of left lower leg, initial encounter: Secondary | ICD-10-CM | POA: Diagnosis not present

## 2021-12-12 DIAGNOSIS — Y9339 Activity, other involving climbing, rappelling and jumping off: Secondary | ICD-10-CM | POA: Diagnosis not present

## 2021-12-12 DIAGNOSIS — S81812A Laceration without foreign body, left lower leg, initial encounter: Secondary | ICD-10-CM | POA: Diagnosis not present

## 2021-12-12 DIAGNOSIS — M79662 Pain in left lower leg: Secondary | ICD-10-CM | POA: Diagnosis not present

## 2021-12-12 MED ORDER — CEPHALEXIN 500 MG PO CAPS: 500.0000 mg | ORAL_CAPSULE | Freq: Two times a day (BID) | ORAL | 0 refills | Status: DC

## 2021-12-12 MED ORDER — LIDOCAINE-EPINEPHRINE 2 %-1:100000 IJ SOLN
30.0000 mL | Freq: Once | INTRAMUSCULAR | Status: DC
  Filled 2021-12-12: qty 30.6

## 2021-12-12 MED ORDER — ACETAMINOPHEN 500 MG PO TABS
1000.0000 mg | ORAL_TABLET | Freq: Once | ORAL | Status: AC
  Administered 2021-12-12: 1000 mg via ORAL
  Filled 2021-12-12: qty 2

## 2021-12-12 MED ORDER — LIDOCAINE-EPINEPHRINE 1 %-1:100000 IJ SOLN
30.0000 mL | Freq: Once | INTRAMUSCULAR | Status: AC
  Administered 2021-12-12: 30 mL
  Filled 2021-12-12: qty 1

## 2021-12-12 NOTE — ED Triage Notes (Signed)
Pt scraped her left shin on metal ladder while climbing. Pt had tetanus shot last year. Bleeding is controlled with compression from towel.  ?

## 2021-12-12 NOTE — ED Provider Notes (Signed)
?[facility] EMERGENCY DEPT ?Provider Note ? ? ?CSN: [id number] ?Arrival date & time: 12/12/21  1159 ? ?  ? ?History ? ?Chief Complaint  ?Patient presents with  ? Leg Injury  ? ? ?Tracey Boyd is a 71 y.o. female. ? ?Patient presents with left shin injury that caught on a metal ladder while climbing.  Tetanus shot is up-to-date last year.  Bleeding controlled pressure.  No neurologic symptoms.  No other injuries.  Pain with walking but patient is walking on it without difficulty. ? ? ?  ? ?Home Medications ?Prior to Admission medications   ?Medication Sig Start Date End Date Taking? Authorizing Provider  ?cephALEXin (KEFLEX) 500 MG capsule Take 1 capsule (500 mg total) by mouth 2 (two) times daily. 12/12/21  Yes Elnora Morrison, MD  ?albuterol (PROAIR HFA) 108 (90 Base) MCG/ACT inhaler Inhale 2 puffs into the lungs every 6 (six) hours as needed for wheezing or shortness of breath. 12/15/18   Janith Lima, MD  ?B Complex-C (SUPER B COMPLEX PO) Take 1 tablet by mouth daily.    [provider]  ?Calcium Carb-Cholecalciferol (CALCIUM/VITAMIN D PO) Take 1,200 mg by mouth daily.     [provider]  ?calcium carbonate (OSCAL) 1500 (600 Ca) MG TABS tablet Take by mouth.    [provider]  ?Fexofenadine HCl (MUCINEX ALLERGY PO) Take by mouth.    [provider]  ?ketoconazole (NIZORAL) 2 % cream ketoconazole 2 % topical cream ? APPLY TO THE AFFECTED AREA(S) BY TOPICAL ROUTE ONCE DAILY    [provider]  ?levothyroxine (SYNTHROID) 75 MCG tablet Take 1 tablet (75 mcg total) by mouth daily. 12/10/21   Janith Lima, MD  ?mometasone (ELOCON) 0.1 % cream Apply topically. 04/19/21   [provider]  ?Multiple Vitamin (MULTIVITAMIN WITH MINERALS) TABS tablet Take 1 tablet by mouth daily. Centrum Silver    [provider]  ?Omega-3 Fatty Acids (FISH OIL CONCENTRATE PO) Take 1,200 mg by mouth daily.     [provider]  ?Propylene Glycol (SYSTANE  BALANCE OP) Place 1 drop into both eyes daily.     [provider]  ?Respiratory Therapy Supplies (FLUTTER) DEVI Use as directed 01/23/15   Tanda Rockers, MD  ?[facility] injection  01/11/21   [provider]  ?Dellis Anes 160-4.5 MCG/ACT inhaler INHALE 2 PUFFS INTO THE LUNGS TWICE DAILY 09/03/21   Janith Lima, MD  ?Triamcinolone Acetonide (NASACORT ALLERGY 24HR NA) Place into the nose.    [provider]  ?VITAMIN D PO Take by mouth.    [provider]  ?   ? ?Allergies    ?Ace inhibitors, Penicillins, Sulfa antibiotics, Sulfonamide derivatives, and Tetracycline   ? ?Review of Systems   ?Review of Systems  ?Constitutional:  Negative for chills and fever.  ?HENT:  Negative for congestion.   ?Eyes:  Negative for visual disturbance.  ?Respiratory:  Negative for shortness of breath.   ?Cardiovascular:  Negative for chest pain.  ?Gastrointestinal:  Negative for abdominal pain and vomiting.  ?Genitourinary:  Negative for dysuria and flank pain.  ?Musculoskeletal:  Negative for back pain, neck pain and neck stiffness.  ?Skin:  Positive for wound. Negative for rash.  ?Neurological:  Negative for light-headedness and headaches.  ? ?Physical Exam ?Updated Vital Signs ?BP (!) 164/99   Pulse 72   Temp 98 ?F (36.7 ?C)   Resp 20   SpO2 100%  ?Physical Exam ?Vitals and nursing note reviewed.  ?Constitutional:   ?  General: She is not in acute distress. ?   Appearance: She is well-developed.  ?HENT:  ?   Head: Normocephalic and atraumatic.  ?   Mouth/Throat:  ?   Mouth: Mucous membranes are moist.  ?Eyes:  ?   General:     ?   Right eye: No discharge.     ?   Left eye: No discharge.  ?   Conjunctiva/sclera: Conjunctivae normal.  ?Neck:  ?   Trachea: No tracheal deviation.  ?Cardiovascular:  ?   Rate and Rhythm: Normal rate.  ?   Heart sounds: No murmur heard. ?Pulmonary:  ?   Effort: Pulmonary effort is normal.  ?Abdominal:  ?   General: There is no distension.  ?Musculoskeletal:     ?    General: Swelling and tenderness present.  ?   Cervical back: Normal range of motion and neck supple.  ?Skin: ?   General: Skin is warm.  ?   Capillary Refill: Capillary refill takes less than 2 seconds.  ?   Comments: Patient has large V-shaped laceration left anterior mid tibia with mild bleeding controlled with pressure, thin skin at distal aspect of laceration, irregular wound.  No obvious fractures externally.  Bone visualized.  Laceration approximate 20 cm.  ?Neurological:  ?   General: No focal deficit present.  ?   Mental Status: She is alert.  ?   Cranial Nerves: No cranial nerve deficit.  ?Psychiatric:     ?   Mood and Affect: Mood normal.  ? ? ?ED Results / Procedures / Treatments   ?Labs ?(all labs ordered are listed, but only abnormal results are displayed) ?Labs Reviewed - No data to display ? ?EKG ?None ? ?Radiology ?DG Tibia/Fibula Left ? ?Result Date: 12/12/2021 ?CLINICAL DATA:  Trauma, pain EXAM: LEFT TIBIA AND FIBULA - 2 VIEW COMPARISON:  None. FINDINGS: No displaced fracture or dislocation is seen. There are no opaque foreign bodies. IMPRESSION: No fracture or dislocation is seen in the left tibia and fibula. Electronically Signed   By: Elmer Picker M.D.   On: 12/12/2021 16:10   ? ?Procedures ?Marland Kitchen.Laceration Repair ? ?Date/Time: 12/12/2021 4:11 PM ?Performed by: Elnora Morrison, MD ?Authorized by: Elnora Morrison, MD  ? ?Consent:  ?  Consent obtained:  Verbal ?  Consent given by:  Patient ?  Risks, benefits, and alternatives were discussed: yes   ?  Risks discussed:  Infection, need for additional repair, poor cosmetic result, poor wound healing, nerve damage and pain ?Universal protocol:  ?  Procedure explained and questions answered to patient or proxy's satisfaction: yes   ?  Patient identity confirmed:  Verbally with patient ?Anesthesia:  ?  Anesthesia method:  Local infiltration ?  Local anesthetic:  Lidocaine 1% WITH epi ?Laceration details:  ?  Location:  Leg ?  Leg location:  L lower leg ?   Length (cm):  20 ?  Depth (mm):  5 ?Pre-procedure details:  ?  Preparation:  Patient was prepped and draped in usual sterile fashion and imaging obtained to evaluate for foreign bodies ?Exploration:  ?  Limited defect created (wound extended): no   ?  Hemostasis achieved with:  Direct pressure ?  Imaging obtained: x-ray   ?  Wound extent: areolar tissue violated and fascia violated   ?  Wound extent: no muscle damage noted and no tendon damage noted   ?  Contaminated: no   ?Treatment:  ?  Area cleansed with:  Saline ?  Amount of  cleaning:  Extensive ?  Irrigation solution:  Sterile saline ?  Irrigation volume:  100 ?  Irrigation method:  Pressure wash ?  Visualized foreign bodies/material removed: no   ?  Debridement:  Minimal ?  Undermining:  Minimal ?  Scar revision: no   ?Skin repair:  ?  Repair method:  Sutures ?  Suture size:  4-0 ?  Suture material:  Prolene ?  Suture technique:  Simple interrupted ?  Number of sutures:  20 ?Approximation:  ?  Approximation:  Close ?Repair type:  ?  Repair type:  Complex ?Post-procedure details:  ?  Dressing:  Non-adherent dressing ?  Procedure completion:  Tolerated  ? ? ?Medications Ordered in ED ?Medications  ?acetaminophen (TYLENOL) tablet 1,000 mg (1,000 mg Oral Given 12/12/21 1436)  ?lidocaine-EPINEPHrine (XYLOCAINE W/EPI) 1 %-1:100000 (with pres) injection 30 mL (30 mLs Other Given 12/12/21 1437)  ? ? ?ED Course/ Medical Decision Making/ A&P ?  ?                        ?Medical Decision Making ?Amount and/or Complexity of Data Reviewed ?Radiology: ordered. ? ?Risk ?OTC drugs. ?Prescription drug management. ? ? ?Patient presents with complex laceration.  Significant tension, able to repair with nonabsorbable sutures.  X-ray pending to ensure no fracture, less likely given no direct significant trauma.  Patient tetanus up-to-date.  Prescription for antibiotics and close outpatient follow-up with plastic surgery within skin margins.  Patient and her husband comfortable with  this plan.  Tylenol given in the ER.  Crutches to minimize walking, wound care/nonstick dressing from nursing. ? ? ? ? ? ? ? ?Final Clinical Impression(s) / ED Diagnoses ?Final diagnoses:  ?Laceration of left l

## 2021-12-12 NOTE — Discharge Instructions (Addendum)
Your x-ray showed no fracture. ? ?Keep clean with gentle soap and water.   ?Minimize excessive use with the lower leg to support its healing. ?Use Tylenol every 4 hours as needed for pain.  Return for signs of infection such as fevers, spreading redness or pus draining.  Take antibiotics as directed for 5 days. ?

## 2021-12-13 ENCOUNTER — Telehealth (HOSPITAL_BASED_OUTPATIENT_CLINIC_OR_DEPARTMENT_OTHER): Payer: Self-pay | Admitting: Emergency Medicine

## 2021-12-13 ENCOUNTER — Other Ambulatory Visit (HOSPITAL_BASED_OUTPATIENT_CLINIC_OR_DEPARTMENT_OTHER): Payer: Self-pay

## 2021-12-13 MED ORDER — CEPHALEXIN 500 MG PO CAPS
500.0000 mg | ORAL_CAPSULE | Freq: Two times a day (BID) | ORAL | 0 refills | Status: DC
  Filled 2021-12-13: qty 10, 5d supply, fill #0

## 2021-12-13 MED ORDER — CEPHALEXIN 500 MG PO CAPS: 500.0000 mg | ORAL_CAPSULE | Freq: Two times a day (BID) | ORAL | 0 refills | Status: DC

## 2021-12-21 ENCOUNTER — Ambulatory Visit: Payer: Medicare HMO | Admitting: Plastic Surgery

## 2021-12-21 ENCOUNTER — Encounter: Payer: Self-pay | Admitting: Plastic Surgery

## 2021-12-21 DIAGNOSIS — S81812A Laceration without foreign body, left lower leg, initial encounter: Secondary | ICD-10-CM | POA: Diagnosis not present

## 2021-12-21 NOTE — Progress Notes (Signed)
? ?  Patient ID: Tracey Boyd, female    DOB: [birth year], 71 y.o.   MRN: [id number] ? ? ?Chief Complaint  ?Patient presents with  ? Consult  ?   ?  ? ? ?The patient is a 71 year old female here with her husband for evaluation of her left leg.  She fell on a metal ladder and lacerated her left anterior shin area.  She had a stellate laceration which was repaired in the emergency room a week and a half ago.  Her tetanus was up-to-date.  The laceration was approximately 20 cm.  It was repaired in the ER with nylon interrupted sutures.  Today she has a fair bit of swelling.  The bruising has improved.  She was using antibiotic ointment until it ran out.  She denies any other injuries. ? ? ?Review of Systems  ?Constitutional:  Positive for activity change. Negative for appetite change.  ?Eyes: Negative.   ?Respiratory:  Negative for chest tightness and shortness of breath.   ?Cardiovascular:  Positive for leg swelling.  ?Genitourinary: Negative.   ?Musculoskeletal: Negative.   ?Skin:  Positive for color change and wound. Negative for pallor.  ? ?Past Medical History:  ?Diagnosis Date  ? Allergy   ? Anemia   ? as a child  ? Anxiety   ? Asthma   ? Dermatitis herpetiformis   ? GERD (gastroesophageal reflux disease)   ? HTN (hypertension)   ? Hypothyroidism   ? Osteoporosis   ?  ?Past Surgical History:  ?Procedure Laterality Date  ? APPENDECTOMY    ? BIOPSY  11/01/2019  ? Procedure: BIOPSY;  Surgeon: Doran Stabler, MD;  Location: [facility];  Service: Gastroenterology;;  ? BRAVO [place] STUDY N/A 11/01/2019  ? Procedure: BRAVO [place];  Surgeon: Doran Stabler, MD;  Location: [facility];  Service: Gastroenterology;  Laterality: N/A;  ? COLONOSCOPY    ? CYSTECTOMY    ? ESOPHAGOGASTRODUODENOSCOPY (EGD) WITH PROPOFOL N/A 11/01/2019  ? Procedure: ESOPHAGOGASTRODUODENOSCOPY (EGD) WITH PROPOFOL;  Surgeon: Doran Stabler, MD;  Location: [facility];  Service: Gastroenterology;  Laterality: N/A;  ? ORIF WRIST FRACTURE  Left 11/08/2016  ? Procedure: OPEN REDUCTION INTERNAL FIXATION (ORIF) LEFT WRIST FRACTURE;  Surgeon: Roseanne Kaufman, MD;  Location: [facility];  Service: Orthopedics;  Laterality: Left;  ?  ? ? ?Current Outpatient Medications:  ?  albuterol (PROAIR HFA) 108 (90 Base) MCG/ACT inhaler, Inhale 2 puffs into the lungs every 6 (six) hours as needed for wheezing or shortness of breath., Disp: 18 g, Rfl: 3 ?  B Complex-C (SUPER B COMPLEX PO), Take 1 tablet by mouth daily., Disp: , Rfl:  ?  Calcium Carb-Cholecalciferol (CALCIUM/VITAMIN D PO), Take 1,200 mg by mouth daily. , Disp: , Rfl:  ?  calcium carbonate (OSCAL) 1500 (600 Ca) MG TABS tablet, Take by mouth., Disp: , Rfl:  ?  cephALEXin (KEFLEX) 500 MG capsule, Take 1 capsule (500 mg total) by mouth 2 (two) times daily., Disp: 10 capsule, Rfl: 0 ?  Fexofenadine HCl (MUCINEX ALLERGY PO), Take by mouth., Disp: , Rfl:  ?  ketoconazole (NIZORAL) 2 % cream, ketoconazole 2 % topical cream  APPLY TO THE AFFECTED AREA(S) BY TOPICAL ROUTE ONCE DAILY, Disp: , Rfl:  ?  levothyroxine (SYNTHROID) 75 MCG tablet, Take 1 tablet (75 mcg total) by mouth daily. (Patient taking differently: Take 75 mcg by mouth every other day.), Disp: 90 tablet, Rfl: 0 ?  mometasone (ELOCON) 0.1 % cream, Apply topically., Disp: ,  Rfl:  ?  Multiple Vitamin (MULTIVITAMIN WITH MINERALS) TABS tablet, Take 1 tablet by mouth daily. Centrum Silver, Disp: , Rfl:  ?  Omega-3 Fatty Acids (FISH OIL CONCENTRATE PO), Take 1,200 mg by mouth daily. , Disp: , Rfl:  ?  Propylene Glycol (SYSTANE BALANCE OP), Place 1 drop into both eyes daily. , Disp: , Rfl:  ?  Respiratory Therapy Supplies (FLUTTER) DEVI, Use as directed, Disp: 1 each, Rfl: 0 ?  SHINGRIX injection, , Disp: , Rfl:  ?  SYMBICORT 160-4.5 MCG/ACT inhaler, INHALE 2 PUFFS INTO THE LUNGS TWICE DAILY, Disp: 30.6 g, Rfl: 1 ?  Triamcinolone Acetonide (NASACORT ALLERGY 24HR NA), Place into the nose., Disp: , Rfl:  ?  VITAMIN D PO, Take by mouth., Disp: , Rfl:   ? ?Objective:   ? ?Vitals:  ? 12/21/21 0942  ?BP: (!) 143/87  ?Pulse: 78  ?SpO2: 97%  ? ? ?Physical Exam ?Vitals reviewed.  ?Constitutional:   ?   Appearance: Normal appearance.  ?HENT:  ?   Head: Normocephalic and atraumatic.  ?Cardiovascular:  ?   Rate and Rhythm: Normal rate.  ?   Pulses: Normal pulses.  ?Pulmonary:  ?   Effort: Pulmonary effort is normal.  ?Musculoskeletal:     ?   General: Swelling present.  ?     Legs: ? ?Skin: ?   General: Skin is warm.  ?   Capillary Refill: Capillary refill takes less than 2 seconds.  ?   Coloration: Skin is not jaundiced.  ?   Findings: Bruising and lesion present.  ?Neurological:  ?   Mental Status: She is alert and oriented to person, place, and time.  ?Psychiatric:     ?   Mood and Affect: Mood normal.     ?   Behavior: Behavior normal.     ?   Judgment: Judgment normal.  ? ? ?Assessment & Plan:  ?Laceration of left lower extremity, initial encounter ? ?The area should be dressed with Vaseline.  She needs to keep it elevated.  Can shower.  Highly recommend compression stockings.  We will take every other suture out in a week and then 1 week later the remaining sutures provided everything looks good. ? ?Pictures were obtained of the patient and placed in the chart with the patient's or guardian's permission. ? ? ?Loel Lofty Daegan Arizmendi, DO ?

## 2021-12-25 ENCOUNTER — Encounter: Payer: Self-pay | Admitting: Plastic Surgery

## 2021-12-25 ENCOUNTER — Telehealth: Payer: Self-pay | Admitting: Plastic Surgery

## 2021-12-25 MED ORDER — CEPHALEXIN 500 MG PO CAPS: 500.0000 mg | ORAL_CAPSULE | Freq: Four times a day (QID) | ORAL | 0 refills | Status: AC

## 2021-12-25 NOTE — Telephone Encounter (Signed)
Pt is calling in stating that her L leg is swollen, red, painful warm to the touch and the area is oozing yellowish fluids. Pt has an appointment to come in on Thursday but wanted to see if she can come in sooner.  Pt has been elevating her leg and putting vaseline on it and no good results from it.  Pt would like to have a call back to let her know what she needs to do. ?

## 2021-12-25 NOTE — Telephone Encounter (Signed)
Message addressed by way of [place].  Per Matthew,PA-C:Good afternoon Pamala Hurry, ?  ?We can start you on an antibiotic (Keflex - Cephalexin), which was also prescribed for you in the emergency room. Did you tolerate that fine?  ?  ?I would keep your appointment for 12/27/21. If the area worsens, let us know. ?  ?Matt ? ?Patient agreed.//AB/CMA ?

## 2021-12-27 ENCOUNTER — Ambulatory Visit: Payer: Medicare HMO | Admitting: Physician Assistant

## 2021-12-27 ENCOUNTER — Encounter: Payer: Self-pay | Admitting: Physician Assistant

## 2021-12-27 DIAGNOSIS — S81812D Laceration without foreign body, left lower leg, subsequent encounter: Secondary | ICD-10-CM | POA: Diagnosis not present

## 2021-12-27 MED ORDER — DOXYCYCLINE HYCLATE 100 MG PO TABS: 100.0000 mg | ORAL_TABLET | Freq: Two times a day (BID) | ORAL | 0 refills | Status: AC

## 2021-12-27 NOTE — Progress Notes (Signed)
? ?Referring Provider ?Janith Lima, MD ?[place],  [place] [address]  ? ?CC:  ?Chief Complaint  ?Patient presents with  ? Follow-up  ?   ? ?Tracey Boyd is an 71 y.o. female.  ?HPI: Patient is a 71 year old female who was seen in the ER after sustaining injury to left shin on a metal ladder 12/12/2021 who presents to our clinic for follow-up. ? ?Reviewed chart and she had a V-shaped laceration along left anterior mid tibia.  Bone was visualized on exam.  Plain films obtained were negative for fracture or other bony abnormality.  Laceration was repaired with simple interrupted 4-0 Prolene.  She was then seen in our office for initial consult on 12/21/2021.  At that time, swelling and bruising was noted.  However, no wound dehiscence.  Plan was to elevate her leg whenever possible and to remove every other suture at subsequent encounter and then remaining sutures 1 week later. ? ?Today, patient is doing okay.  She states that she feels as though the swelling is starting to improve, slowly.  She does admit that the redness has gotten worse since her last encounter.  She denies any increase in pain, but does state that it is tender around the laceration site.  She has had mild thin yellowish drainage.  She tells me that this occurred after scraping it alongside a boat ladder. ? ?She denies any fevers, inability to ambulate, cloudy purulent drainage, numbness, or other symptoms. ? ? ?Allergies  ?Allergen Reactions  ? Ace Inhibitors Cough  ?  Reaction to lisinopril  ? Penicillins Hives  ?  Has patient had a PCN reaction causing immediate rash, facial/tongue/throat swelling, SOB or lightheadedness with hypotension: Yes ?Has patient had a PCN reaction causing severe rash involving mucus membranes or skin necrosis: No ?Has patient had a PCN reaction that required hospitalization No ?Has patient had a PCN reaction occurring within the last 10 years: No ?If all of the above answers are "NO", then may proceed  with Cephalosporin use.  ? Sulfa Antibiotics Hives  ? Sulfonamide Derivatives Hives  ? Tetracycline   ? ? ?Outpatient Encounter Medications as of 12/27/2021  ?Medication Sig  ? albuterol (PROAIR HFA) 108 (90 Base) MCG/ACT inhaler Inhale 2 puffs into the lungs every 6 (six) hours as needed for wheezing or shortness of breath.  ? B Complex-C (SUPER B COMPLEX PO) Take 1 tablet by mouth daily.  ? Calcium Carb-Cholecalciferol (CALCIUM/VITAMIN D PO) Take 1,200 mg by mouth daily.   ? calcium carbonate (OSCAL) 1500 (600 Ca) MG TABS tablet Take by mouth.  ? cephALEXin (KEFLEX) 500 MG capsule Take 1 capsule (500 mg total) by mouth 4 (four) times daily for 3 days.  ? Fexofenadine HCl (MUCINEX ALLERGY PO) Take by mouth.  ? ketoconazole (NIZORAL) 2 % cream ketoconazole 2 % topical cream ? APPLY TO THE AFFECTED AREA(S) BY TOPICAL ROUTE ONCE DAILY  ? levothyroxine (SYNTHROID) 75 MCG tablet Take 1 tablet (75 mcg total) by mouth daily. (Patient taking differently: Take 75 mcg by mouth every other day.)  ? mometasone (ELOCON) 0.1 % cream Apply topically.  ? Multiple Vitamin (MULTIVITAMIN WITH MINERALS) TABS tablet Take 1 tablet by mouth daily. Centrum Silver  ? Omega-3 Fatty Acids (FISH OIL CONCENTRATE PO) Take 1,200 mg by mouth daily.   ? Propylene Glycol (SYSTANE BALANCE OP) Place 1 drop into both eyes daily.   ? Respiratory Therapy Supplies (FLUTTER) DEVI Use as directed  ? SHINGRIX injection   ?  SYMBICORT 160-4.5 MCG/ACT inhaler INHALE 2 PUFFS INTO THE LUNGS TWICE DAILY  ? Triamcinolone Acetonide (NASACORT ALLERGY 24HR NA) Place into the nose.  ? VITAMIN D PO Take by mouth.  ? ?No facility-administered encounter medications on file as of 12/27/2021.  ?  ? ?Past Medical History:  ?Diagnosis Date  ? Allergy   ? Anemia   ? as a child  ? Anxiety   ? Asthma   ? Dermatitis herpetiformis   ? GERD (gastroesophageal reflux disease)   ? HTN (hypertension)   ? Hypothyroidism   ? Osteoporosis   ? ? ?Past Surgical History:  ?Procedure  Laterality Date  ? APPENDECTOMY    ? BIOPSY  11/01/2019  ? Procedure: BIOPSY;  Surgeon: Doran Stabler, MD;  Location: [facility];  Service: Gastroenterology;;  ? BRAVO [place] STUDY N/A 11/01/2019  ? Procedure: BRAVO [place];  Surgeon: Doran Stabler, MD;  Location: [facility];  Service: Gastroenterology;  Laterality: N/A;  ? COLONOSCOPY    ? CYSTECTOMY    ? ESOPHAGOGASTRODUODENOSCOPY (EGD) WITH PROPOFOL N/A 11/01/2019  ? Procedure: ESOPHAGOGASTRODUODENOSCOPY (EGD) WITH PROPOFOL;  Surgeon: Doran Stabler, MD;  Location: [facility];  Service: Gastroenterology;  Laterality: N/A;  ? ORIF WRIST FRACTURE Left 11/08/2016  ? Procedure: OPEN REDUCTION INTERNAL FIXATION (ORIF) LEFT WRIST FRACTURE;  Surgeon: Roseanne Kaufman, MD;  Location: [facility];  Service: Orthopedics;  Laterality: Left;  ? ? ?Family History  ?Problem Relation Age of Onset  ? Multiple sclerosis Mother   ? Lung cancer Mother   ? Colon polyps Mother   ? Alcohol abuse Father   ? Multiple sclerosis Sister   ? Multiple sclerosis Paternal Aunt   ? Dementia Sister   ? Other Grandchild   ?     special needs  ? Colon cancer Neg Hx   ? Esophageal cancer Neg Hx   ? Rectal cancer Neg Hx   ? Stomach cancer Neg Hx   ? ? ?Social History  ? ?Social History Narrative  ? Not on file  ?  ? ?Review of Systems ?General: Denies fevers or chills ?MSK: Denies difficulty ambulating ?Skin: Endorses increased redness, denies purulent drainage ? ?Physical Exam ? ?  12/21/2021  ?  9:42 AM 12/12/2021  ?  5:02 PM 12/12/2021  ? 12:18 PM  ?Vitals with BMI  ?Height '5\' 4"'$     ?Weight 140 lbs    ?BMI 24.02    ?Systolic 462 703 500  ?Diastolic 87 84 99  ?Pulse 78 74 72  ?  ?General:  No acute distress, nontoxic appearing  ?Respiratory: No increased work of breathing ?Neuro: Alert and oriented ?Psychiatric: Normal mood and affect ?Skin: 14 cm length V-shaped laceration s/p repair.  Counted 19 stitches in total, removed 9 today in clinic without complication or difficulty.  Surrounding  erythema that is tender to the touch.  Appears mildly swollen.  Peripheral pulse intact, no significant lower extremity edema.  No crepitus appreciated around the wound.  No malodor. ? ?Assessment/Plan ? ?Patient with worsening erythema around the laceration compared to pictures obtained last encounter.  She had been prescribed Keflex, but will change to doxycycline for broader coverage.  She will discontinue the Keflex and begin the doxycycline today.  She reports that she has multiple allergies listed in her chart that are inaccurate and from her childhood.  She understands that she will need to discontinue the doxycycline immediately if she develops any reactions given that tetracyclines (in addition to penicillins  and sulfa drugs) is listed as allergy with no listed reactions. ? ?Removed half of the sutures.  Recommending applying cool compress twice daily.  There is very localized swelling around the laceration and she describes thin yellowish drainage.  Applying compresses may help facilitate continued drainage.  She also tells me that she had been applying Neosporin to the area before her last clinic visit which may be contributing to her worsening redness.  She is functionally neurovascularly intact. ? ?Return in 1 week for removal of her residual sutures and reevaluation of wound site. ? ? ?Krista Blue ?12/27/2021, 12:31 PM  ? ? ?  ? ?

## 2022-01-01 NOTE — Progress Notes (Signed)
? ?Referring Provider ?Janith Lima, MD ?[place],  [place] [address]  ? ?CC:  ?Chief Complaint  ?Patient presents with  ? Follow-up  ?   ? ?Tracey Boyd is an 71 y.o. female.  ?HPI: Patient is a 71 year old female who was seen in the ER after sustaining injury to left shin on a metal ladder 12/12/2021 who presents to our clinic for follow-up.  Reviewed chart and she had a V-shaped laceration along left anterior mid tibia.  Bone was visualized on exam.  Plain films obtained were negative for fracture or other bony abnormality.  Laceration was repaired with simple interrupted 4-0 Prolene.   ? ?She was seen most recently here in the office on 12/27/2021.  At that time, she reported that the swelling had started to improve, but did endorse worsening redness since previous encounter.  Denied any associated increase in pain, but reports that tender around the laceration site.  She had had mild thin yellowish drainage.  No crepitus was appreciated on exam.  No malodor or purulent drainage.  However, given worsening redness, discontinue the Keflex and changed to doxycycline for broader coverage.  The redness could have been response to Neosporin she had been previously applying.  Half of her sutures were removed.  Also recommended that she apply cool compresses twice daily to help facilitate continued drainage.  She was otherwise well-appearing.  Plan was for return in 1 week for removal of residual sutures. ? ?Today, patient is frustrated with the chronicity of which it is taking for her wound to heal.  She states that she has been elevating her leg and applying the cool compresses, as we discussed.  She has had mild thin yellow drainage intermittently.  She states that she has had some dryness around the laceration repair site.  However, she states that overall she feels as though it is getting better, slowly.  She reports less distal edema.  She still endorses some tenderness.  Denies any worsening  redness, worsening swelling, foul-smelling purulent drainage, fevers, or other symptoms. ? ? ? ?Allergies  ?Allergen Reactions  ? Ace Inhibitors Cough  ?  Reaction to lisinopril  ? Penicillins Hives  ?  Has patient had a PCN reaction causing immediate rash, facial/tongue/throat swelling, SOB or lightheadedness with hypotension: Yes ?Has patient had a PCN reaction causing severe rash involving mucus membranes or skin necrosis: No ?Has patient had a PCN reaction that required hospitalization No ?Has patient had a PCN reaction occurring within the last 10 years: No ?If all of the above answers are "NO", then may proceed with Cephalosporin use.  ? Sulfa Antibiotics Hives  ? Sulfonamide Derivatives Hives  ? Tetracycline   ? ? ?Outpatient Encounter Medications as of 01/03/2022  ?Medication Sig  ? albuterol (PROAIR HFA) 108 (90 Base) MCG/ACT inhaler Inhale 2 puffs into the lungs every 6 (six) hours as needed for wheezing or shortness of breath.  ? B Complex-C (SUPER B COMPLEX PO) Take 1 tablet by mouth daily.  ? Calcium Carb-Cholecalciferol (CALCIUM/VITAMIN D PO) Take 1,200 mg by mouth daily.   ? calcium carbonate (OSCAL) 1500 (600 Ca) MG TABS tablet Take by mouth.  ? doxycycline (VIBRA-TABS) 100 MG tablet Take 1 tablet (100 mg total) by mouth 2 (two) times daily for 7 days.  ? Fexofenadine HCl (MUCINEX ALLERGY PO) Take by mouth.  ? ketoconazole (NIZORAL) 2 % cream ketoconazole 2 % topical cream ? APPLY TO THE AFFECTED AREA(S) BY TOPICAL ROUTE ONCE DAILY  ?  levothyroxine (SYNTHROID) 75 MCG tablet Take 1 tablet (75 mcg total) by mouth daily. (Patient taking differently: Take 75 mcg by mouth every other day.)  ? mometasone (ELOCON) 0.1 % cream Apply topically.  ? Multiple Vitamin (MULTIVITAMIN WITH MINERALS) TABS tablet Take 1 tablet by mouth daily. Centrum Silver  ? Omega-3 Fatty Acids (FISH OIL CONCENTRATE PO) Take 1,200 mg by mouth daily.   ? Propylene Glycol (SYSTANE BALANCE OP) Place 1 drop into both eyes daily.   ?  Respiratory Therapy Supplies (FLUTTER) DEVI Use as directed  ? SHINGRIX injection   ? SYMBICORT 160-4.5 MCG/ACT inhaler INHALE 2 PUFFS INTO THE LUNGS TWICE DAILY  ? Triamcinolone Acetonide (NASACORT ALLERGY 24HR NA) Place into the nose.  ? VITAMIN D PO Take by mouth.  ? ?No facility-administered encounter medications on file as of 01/03/2022.  ?  ? ?Past Medical History:  ?Diagnosis Date  ? Allergy   ? Anemia   ? as a child  ? Anxiety   ? Asthma   ? Dermatitis herpetiformis   ? GERD (gastroesophageal reflux disease)   ? HTN (hypertension)   ? Hypothyroidism   ? Osteoporosis   ? ? ?Past Surgical History:  ?Procedure Laterality Date  ? APPENDECTOMY    ? BIOPSY  11/01/2019  ? Procedure: BIOPSY;  Surgeon: Doran Stabler, MD;  Location: [facility];  Service: Gastroenterology;;  ? BRAVO [place] STUDY N/A 11/01/2019  ? Procedure: BRAVO [place];  Surgeon: Doran Stabler, MD;  Location: [facility];  Service: Gastroenterology;  Laterality: N/A;  ? COLONOSCOPY    ? CYSTECTOMY    ? ESOPHAGOGASTRODUODENOSCOPY (EGD) WITH PROPOFOL N/A 11/01/2019  ? Procedure: ESOPHAGOGASTRODUODENOSCOPY (EGD) WITH PROPOFOL;  Surgeon: Doran Stabler, MD;  Location: [facility];  Service: Gastroenterology;  Laterality: N/A;  ? ORIF WRIST FRACTURE Left 11/08/2016  ? Procedure: OPEN REDUCTION INTERNAL FIXATION (ORIF) LEFT WRIST FRACTURE;  Surgeon: Roseanne Kaufman, MD;  Location: [facility];  Service: Orthopedics;  Laterality: Left;  ? ? ?Family History  ?Problem Relation Age of Onset  ? Multiple sclerosis Mother   ? Lung cancer Mother   ? Colon polyps Mother   ? Alcohol abuse Father   ? Multiple sclerosis Sister   ? Multiple sclerosis Paternal Aunt   ? Dementia Sister   ? Other Grandchild   ?     special needs  ? Colon cancer Neg Hx   ? Esophageal cancer Neg Hx   ? Rectal cancer Neg Hx   ? Stomach cancer Neg Hx   ? ? ?Social History  ? ?Social History Narrative  ? Not on file  ?  ? ?Review of Systems ?General: Denies fevers or chills ?MSK: Denies  difficulty ambulating ?Skin: Endorses mild thin yellow drainage and persistent redness, improved swelling ? ?Physical Exam ? ?  12/21/2021  ?  9:42 AM 12/12/2021  ?  5:02 PM 12/12/2021  ? 12:18 PM  ?Vitals with BMI  ?Height '5\' 4"'$     ?Weight 140 lbs    ?BMI 24.02    ?Systolic 465 035 465  ?Diastolic 87 84 99  ?Pulse 78 74 72  ?  ?General:  No acute distress, nontoxic appearing  ?Respiratory: No increased work of breathing ?Neuro: Alert and oriented ?Psychiatric: Normal mood and affect  ?Skin: Mild scabbing throughout laceration repair site.  Mild amount of thin yellow drainage expressed, no cloudiness or gross purulence.  Her surrounding erythema has improved, mildly.  Improved swelling.  Still tender along laceration.  No fluctuance or crepitus. ?MSK: Ambulatory.  Good capillary refill distally ? ?Assessment/Plan ? ?Patient's wound appears to be slightly improved.  Slow healing and persistent redness is not entirely unusual given location of injury.  Does not appear to be grossly infected and do not feel as though she requires additional antibiotics at this time. ? ?That said, we will continue to abstain from removing the remainder sutures at this time and instead asked that she return to see me again next week.  Discussed scarring related to leaving sutures in place, patient understands.  She will continue to avoid Neosporin, but recommending Vaseline daily to the lacerations followed by nonstick adhesive pad and secured with Medipore tape.  She may continue cool compresses between dressing changes. ? ?Picture(s) obtained of the patient and placed in the chart were with the patient's or guardian's permission. ? ? ?Krista Blue ?01/03/2022, 12:45 PM  ? ? ?  ? ?

## 2022-01-02 ENCOUNTER — Ambulatory Visit: Payer: Medicare HMO

## 2022-01-03 ENCOUNTER — Ambulatory Visit (INDEPENDENT_AMBULATORY_CARE_PROVIDER_SITE_OTHER): Payer: Medicare HMO

## 2022-01-03 ENCOUNTER — Ambulatory Visit (INDEPENDENT_AMBULATORY_CARE_PROVIDER_SITE_OTHER): Payer: Medicare HMO | Admitting: Physician Assistant

## 2022-01-03 DIAGNOSIS — S81812D Laceration without foreign body, left lower leg, subsequent encounter: Secondary | ICD-10-CM

## 2022-01-03 DIAGNOSIS — M8000XD Age-related osteoporosis with current pathological fracture, unspecified site, subsequent encounter for fracture with routine healing: Secondary | ICD-10-CM | POA: Diagnosis not present

## 2022-01-03 MED ORDER — ROMOSOZUMAB-AQQG 105 MG/1.17ML ~~LOC~~ SOSY
210.0000 mg | PREFILLED_SYRINGE | Freq: Once | SUBCUTANEOUS | Status: AC
  Administered 2022-01-03: 210 mg via SUBCUTANEOUS

## 2022-01-03 NOTE — Progress Notes (Signed)
Patient seen in office today and given 9th Evenity shot today. ?

## 2022-01-11 ENCOUNTER — Encounter: Payer: Self-pay | Admitting: Plastic Surgery

## 2022-01-11 ENCOUNTER — Other Ambulatory Visit (INDEPENDENT_AMBULATORY_CARE_PROVIDER_SITE_OTHER): Payer: Medicare HMO | Admitting: Physician Assistant

## 2022-01-11 ENCOUNTER — Telehealth: Payer: Self-pay | Admitting: Plastic Surgery

## 2022-01-11 DIAGNOSIS — S81812D Laceration without foreign body, left lower leg, subsequent encounter: Secondary | ICD-10-CM

## 2022-01-11 MED ORDER — CIPROFLOXACIN HCL 500 MG PO TABS: 500.0000 mg | ORAL_TABLET | Freq: Two times a day (BID) | ORAL | 0 refills | Status: AC

## 2022-01-11 NOTE — Telephone Encounter (Signed)
Patient called to report issues with incision site; states it's oozing puss and sometimes blood. Patient asked if she needs to be on antibiotics; not currently taking any. ?Patient will sent photos through [facility]. ? ?If antibiotics needed, patient requesting for script to be sent to [facility] in [place], [place]. ? ?Pharmacy information confirmed as ? ?[place] [address] ?Store [phone] ?[phone]. ? ?Please advise at [phone]. ?

## 2022-01-11 NOTE — Telephone Encounter (Signed)
Called, na, left vm with call back #.  ?

## 2022-01-11 NOTE — Progress Notes (Signed)
Patient with ongoing drainage from her subacute lower leg wound.  She had previously been treated with antibiotics without significant improvement.  No systemic symptoms at last visit, but persistent erythema and drainage.  Prescribed doxycycline last time.  Options are limited due to patient's allergies to various antibiotics.  We will cover for Pseudomonas with prescription for ciprofloxacin.  Called cell phone but went to straight to voicemail.  She has follow-up on Monday. ?

## 2022-01-14 ENCOUNTER — Ambulatory Visit: Payer: Medicare HMO | Admitting: Physician Assistant

## 2022-01-14 ENCOUNTER — Encounter: Payer: Self-pay | Admitting: Physician Assistant

## 2022-01-14 DIAGNOSIS — S81812D Laceration without foreign body, left lower leg, subsequent encounter: Secondary | ICD-10-CM | POA: Diagnosis not present

## 2022-01-14 NOTE — Progress Notes (Signed)
? ?Referring Provider ?Janith Lima, MD ?[place],  [place] [address]  ? ?CC:  ?Chief Complaint  ?Patient presents with  ? Follow-up  ?   ? ?Tracey Boyd is an 71 y.o. female.  ?HPI: Patient is a 71 year old female who was seen in the ER after sustaining injury to left shin on a metal ladder 12/12/2021 who presents to our clinic for follow-up.  Reviewed chart and she had a V-shaped laceration along left anterior mid tibia.  Bone was visualized on exam.  Plain films obtained were negative for fracture or other bony abnormality.  Laceration was repaired with simple interrupted 4-0 Prolene.   ? ?Patient has been seen in our office for multiple visits following her injury.  Unfortunately, there has been no significant improvement in wound healing.  She has had persistent erythema and tenderness.  She was last seen here in the office on 01/03/2022 and at that time she was frustrated with the lack of progress.  She has been having persistent thin yellow drainage not improved with cool compresses or antibiotics.  She was without any fevers or overt purulence.  No obvious evidence of infection at that time, but she then called and a week later with concerns that it was feeling warm and tender to the touch so ciprofloxacin was called into her pharmacy.  She had already been treated with doxycycline and she is limited given her antibiotic allergies. ? ?Today, patient reports that she is feeling improved, albeit still frustrated with the slow progress.  Patient reports that the pain and tenderness has improved as well as the swelling and redness.  However, she is worried that it will scar and is hopeful that it can be reapproximated here.  She will continue to have mild clearish yellow drainage, but no overt purulence. ? ? ? ?Allergies  ?Allergen Reactions  ? Ace Inhibitors Cough  ?  Reaction to lisinopril  ? Penicillins Hives  ?  Has patient had a PCN reaction causing immediate rash, facial/tongue/throat  swelling, SOB or lightheadedness with hypotension: Yes ?Has patient had a PCN reaction causing severe rash involving mucus membranes or skin necrosis: No ?Has patient had a PCN reaction that required hospitalization No ?Has patient had a PCN reaction occurring within the last 10 years: No ?If all of the above answers are "NO", then may proceed with Cephalosporin use.  ? Sulfa Antibiotics Hives  ? Sulfonamide Derivatives Hives  ? Tetracycline   ? ? ?Outpatient Encounter Medications as of 01/14/2022  ?Medication Sig  ? albuterol (PROAIR HFA) 108 (90 Base) MCG/ACT inhaler Inhale 2 puffs into the lungs every 6 (six) hours as needed for wheezing or shortness of breath.  ? B Complex-C (SUPER B COMPLEX PO) Take 1 tablet by mouth daily.  ? Calcium Carb-Cholecalciferol (CALCIUM/VITAMIN D PO) Take 1,200 mg by mouth daily.   ? calcium carbonate (OSCAL) 1500 (600 Ca) MG TABS tablet Take by mouth.  ? ciprofloxacin (CIPRO) 500 MG tablet Take 1 tablet (500 mg total) by mouth 2 (two) times daily for 10 days.  ? Fexofenadine HCl (MUCINEX ALLERGY PO) Take by mouth.  ? ketoconazole (NIZORAL) 2 % cream ketoconazole 2 % topical cream ? APPLY TO THE AFFECTED AREA(S) BY TOPICAL ROUTE ONCE DAILY  ? levothyroxine (SYNTHROID) 75 MCG tablet Take 1 tablet (75 mcg total) by mouth daily. (Patient taking differently: Take 75 mcg by mouth every other day.)  ? mometasone (ELOCON) 0.1 % cream Apply topically.  ? Multiple Vitamin (MULTIVITAMIN  WITH MINERALS) TABS tablet Take 1 tablet by mouth daily. Centrum Silver  ? Omega-3 Fatty Acids (FISH OIL CONCENTRATE PO) Take 1,200 mg by mouth daily.   ? Propylene Glycol (SYSTANE BALANCE OP) Place 1 drop into both eyes daily.   ? Respiratory Therapy Supplies (FLUTTER) DEVI Use as directed  ? SHINGRIX injection   ? SYMBICORT 160-4.5 MCG/ACT inhaler INHALE 2 PUFFS INTO THE LUNGS TWICE DAILY  ? Triamcinolone Acetonide (NASACORT ALLERGY 24HR NA) Place into the nose.  ? VITAMIN D PO Take by mouth.  ? ?No  facility-administered encounter medications on file as of 01/14/2022.  ?  ? ?Past Medical History:  ?Diagnosis Date  ? Allergy   ? Anemia   ? as a child  ? Anxiety   ? Asthma   ? Dermatitis herpetiformis   ? GERD (gastroesophageal reflux disease)   ? HTN (hypertension)   ? Hypothyroidism   ? Osteoporosis   ? ? ?Past Surgical History:  ?Procedure Laterality Date  ? APPENDECTOMY    ? BIOPSY  11/01/2019  ? Procedure: BIOPSY;  Surgeon: Doran Stabler, MD;  Location: [facility];  Service: Gastroenterology;;  ? BRAVO [place] STUDY N/A 11/01/2019  ? Procedure: BRAVO [place];  Surgeon: Doran Stabler, MD;  Location: [facility];  Service: Gastroenterology;  Laterality: N/A;  ? COLONOSCOPY    ? CYSTECTOMY    ? ESOPHAGOGASTRODUODENOSCOPY (EGD) WITH PROPOFOL N/A 11/01/2019  ? Procedure: ESOPHAGOGASTRODUODENOSCOPY (EGD) WITH PROPOFOL;  Surgeon: Doran Stabler, MD;  Location: [facility];  Service: Gastroenterology;  Laterality: N/A;  ? ORIF WRIST FRACTURE Left 11/08/2016  ? Procedure: OPEN REDUCTION INTERNAL FIXATION (ORIF) LEFT WRIST FRACTURE;  Surgeon: Roseanne Kaufman, MD;  Location: [facility];  Service: Orthopedics;  Laterality: Left;  ? ? ?Family History  ?Problem Relation Age of Onset  ? Multiple sclerosis Mother   ? Lung cancer Mother   ? Colon polyps Mother   ? Alcohol abuse Father   ? Multiple sclerosis Sister   ? Multiple sclerosis Paternal Aunt   ? Dementia Sister   ? Other Grandchild   ?     special needs  ? Colon cancer Neg Hx   ? Esophageal cancer Neg Hx   ? Rectal cancer Neg Hx   ? Stomach cancer Neg Hx   ? ? ?Social History  ? ?Social History Narrative  ? Not on file  ?  ? ?Review of Systems ?General: Denies fevers or chills ?MSK: Denies difficulty walking ?Skin: Endorses improved redness and swelling ? ?Physical Exam ? ?  12/21/2021  ?  9:42 AM 12/12/2021  ?  5:02 PM 12/12/2021  ? 12:18 PM  ?Vitals with BMI  ?Height '5\' 4"'$     ?Weight 140 lbs    ?BMI 24.02    ?Systolic 342 876 811  ?Diastolic 87 84 99  ?Pulse 78 74 72   ?  ?General:  No acute distress, nontoxic appearing  ?Respiratory: No increased work of breathing ?Neuro: Alert and oriented ?Psychiatric: Normal mood and affect  ?MSK: Ambulatory.  Pedal pulse intact.  Good range of motion. ?Skin: Left anterior lower leg: Dehiscence noted centrally.  Good wound healing distally where the laceration turned medially.  Mild surrounding erythema, but improved compared to prior encounters.  Swelling and tenderness has also improved.  No drainage on exam.  Wound edges have already healed. ? ?Assessment/Plan ? ?Left anterior lower leg wound: Appears improved compared to prior encounters.  She inquired about reapproximation with either sutures  or Steri-Strips, but advised against it.  Her wound edges have already healed and would need to be excised for reapproximation.  However, at this point, past option for her is to allow for it to heal by secondary intent.  The few residual sutures that remain in place were removed here today without complication or difficulty.  Informed patient that her wound may appear to open up slightly in the next couple of days, but that it will have to heal from the bottom up.  She understands that there will be a scar.  Plans for her to keep it clean and covered.  Recommending continued Vaseline and gauze. ? ?She may continue taking the ciprofloxacin, but no evidence of infection at this time.  Appears to be healing appropriately.  Unfortunately, given location, delayed wound healing is not uncommon.  She understands and is agreeable to the plan.  We will have her follow-up in 3 weeks. ? ?Picture(s) obtained of the patient and placed in the chart were with the patient's or guardian's permission. ? ? ?Krista Blue ?01/14/2022, 12:58 PM  ? ? ?  ? ?

## 2022-02-07 ENCOUNTER — Ambulatory Visit (INDEPENDENT_AMBULATORY_CARE_PROVIDER_SITE_OTHER): Payer: Medicare HMO

## 2022-02-07 DIAGNOSIS — M8000XD Age-related osteoporosis with current pathological fracture, unspecified site, subsequent encounter for fracture with routine healing: Secondary | ICD-10-CM

## 2022-02-07 MED ORDER — ROMOSOZUMAB-AQQG 105 MG/1.17ML ~~LOC~~ SOSY
210.0000 mg | PREFILLED_SYRINGE | Freq: Once | SUBCUTANEOUS | Status: AC
  Administered 2022-02-07: 210 mg via SUBCUTANEOUS

## 2022-02-07 NOTE — Progress Notes (Signed)
Referring Provider Janith Lima, MD [address] [name],  [place] [address]   CC:  Chief Complaint  Patient presents with   Follow-up      Tracey Boyd is an 71 y.o. female.  HPI: Patient is a pleasant 71 year old female who was seen in the ER after sustaining injury to left shin on a metal ladder 12/12/2021 who presents to our clinic for follow-up.  Reviewed chart and she had a V-shaped laceration along left anterior mid tibia.  Bone was visualized on exam.  Plain films obtained were negative for fracture or other bony abnormality.  Laceration was repaired with simple interrupted 4-0 Prolene.    She was last seen here in clinic on 01/14/2022.  Unfortunately, there has been slow improvement since she sustained her injury back in April.  She has required multiple rounds of oral antibiotics in addition to cool compresses.  Unfortunately, the location of her injury is commonly slow to heal.  The few residual sutures that had remained in place were removed without complication.  Informed patient that the wound may appear to separate slightly in the following days, but that it would have to heal from the bottom by secondary intent.  If she is concerned about the scar in the future, can always be revised.  Plan is for her to return in 3 weeks for reevaluation.  Today, patient is accompanied by her grandson.  She states that she no longer has pain with ambulation.  She continues to be mildly sore over the tibia around site of her injury, but that it has improved.  She also reports persistent discoloration, but that her wound has finally healed.  She inquires about scar mitigation options.   Allergies  Allergen Reactions   Ace Inhibitors Cough    Reaction to lisinopril   Penicillins Hives    Has patient had a PCN reaction causing immediate rash, facial/tongue/throat swelling, SOB or lightheadedness with hypotension: Yes Has patient had a PCN reaction causing severe rash involving mucus  membranes or skin necrosis: No Has patient had a PCN reaction that required hospitalization No Has patient had a PCN reaction occurring within the last 10 years: No If all of the above answers are "NO", then may proceed with Cephalosporin use.   Sulfa Antibiotics Hives   Sulfonamide Derivatives Hives   Tetracycline     Outpatient Encounter Medications as of 02/11/2022  Medication Sig   albuterol (PROAIR HFA) 108 (90 Base) MCG/ACT inhaler Inhale 2 puffs into the lungs every 6 (six) hours as needed for wheezing or shortness of breath.   B Complex-C (SUPER B COMPLEX PO) Take 1 tablet by mouth daily.   Calcium Carb-Cholecalciferol (CALCIUM/VITAMIN D PO) Take 1,200 mg by mouth daily.    calcium carbonate (OSCAL) 1500 (600 Ca) MG TABS tablet Take by mouth.   Fexofenadine HCl (MUCINEX ALLERGY PO) Take by mouth.   ketoconazole (NIZORAL) 2 % cream ketoconazole 2 % topical cream  APPLY TO THE AFFECTED AREA(S) BY TOPICAL ROUTE ONCE DAILY   levothyroxine (SYNTHROID) 75 MCG tablet Take 1 tablet (75 mcg total) by mouth daily. (Patient taking differently: Take 75 mcg by mouth every other day.)   mometasone (ELOCON) 0.1 % cream Apply topically.   Multiple Vitamin (MULTIVITAMIN WITH MINERALS) TABS tablet Take 1 tablet by mouth daily. Centrum Silver   Omega-3 Fatty Acids (FISH OIL CONCENTRATE PO) Take 1,200 mg by mouth daily.    Propylene Glycol (SYSTANE BALANCE OP) Place 1 drop into both eyes  daily.    Respiratory Therapy Supplies (FLUTTER) DEVI Use as directed   SHINGRIX injection    SYMBICORT 160-4.5 MCG/ACT inhaler INHALE 2 PUFFS INTO THE LUNGS TWICE DAILY   Triamcinolone Acetonide (NASACORT ALLERGY 24HR NA) Place into the nose.   VITAMIN D PO Take by mouth.   No facility-administered encounter medications on file as of 02/11/2022.     Past Medical History:  Diagnosis Date   Allergy    Anemia    as a child   Anxiety    Asthma    Dermatitis herpetiformis    GERD (gastroesophageal reflux disease)     HTN (hypertension)    Hypothyroidism    Osteoporosis     Past Surgical History:  Procedure Laterality Date   APPENDECTOMY     BIOPSY  11/01/2019   Procedure: BIOPSY;  Surgeon: Doran Stabler, MD;  Location: [facility];  Service: Gastroenterology;;   [facility] STUDY N/A 11/01/2019   Procedure: [facility] [place];  Surgeon: Doran Stabler, MD;  Location: [facility];  Service: Gastroenterology;  Laterality: N/A;   COLONOSCOPY     CYSTECTOMY     ESOPHAGOGASTRODUODENOSCOPY (EGD) WITH PROPOFOL N/A 11/01/2019   Procedure: ESOPHAGOGASTRODUODENOSCOPY (EGD) WITH PROPOFOL;  Surgeon: Doran Stabler, MD;  Location: [facility];  Service: Gastroenterology;  Laterality: N/A;   ORIF WRIST FRACTURE Left 11/08/2016   Procedure: OPEN REDUCTION INTERNAL FIXATION (ORIF) LEFT WRIST FRACTURE;  Surgeon: Roseanne Kaufman, MD;  Location: [facility];  Service: Orthopedics;  Laterality: Left;    Family History  Problem Relation Age of Onset   Multiple sclerosis Mother    Lung cancer Mother    Colon polyps Mother    Alcohol abuse Father    Multiple sclerosis Sister    Multiple sclerosis Paternal Aunt    Dementia Sister    Other Grandchild        special needs   Colon cancer Neg Hx    Esophageal cancer Neg Hx    Rectal cancer Neg Hx    Stomach cancer Neg Hx     Social History   Social History Narrative   Not on file     Review of Systems General: Denies fevers or chills Skin: Endorses wound healing, denies drainage  Physical Exam    12/21/2021    9:42 AM 12/12/2021    5:02 PM 12/12/2021   12:18 PM  Vitals with BMI  Height '5\' 4"'$     Weight 140 lbs    BMI 48.18    Systolic 563 149 702  Diastolic 87 84 99  Pulse 78 74 72    General:  No acute distress, nontoxic appearing  Respiratory: No increased work of breathing Neuro: Alert and oriented Psychiatric: Normal mood and affect  MSK: Ambulatory, good ROM, no bony step-off over tibia, good perfusion distally Skin: Checkmark-shaped  laceration appears to have fully healed/epithelialized.  Train track scarring noted.  Assessment/Plan  Left tibial laceration with delayed healing: Appears to have finally healed with good epithelialization.  Minimal surrounding discoloration, significantly improved compared to previous encounters.  No cellulitic changes.  Train tract scarring noted, patient states that she is not bothered.  Regardless, discussed silicone-based scar mitigation creams and sheets.  She is functionally and neurovascularly intact.  No specific follow-up needed, but she can certainly return should she have any questions or concerns. Picture(s) obtained of the patient and placed in the chart were with the patient's or guardian's permission.   Krista Blue 02/11/2022, 4:16  PM

## 2022-02-07 NOTE — Progress Notes (Signed)
After obtaining consent, and per orders of Dr. Ronnald Ramp, injection of Evenity given by Max Sane. Patient tolerated injection well in right arm and was informed to report any adverse reaction to me immediately.

## 2022-02-11 ENCOUNTER — Encounter: Payer: Self-pay | Admitting: Physician Assistant

## 2022-02-11 ENCOUNTER — Ambulatory Visit (INDEPENDENT_AMBULATORY_CARE_PROVIDER_SITE_OTHER): Payer: Medicare HMO | Admitting: Physician Assistant

## 2022-02-11 DIAGNOSIS — S81812D Laceration without foreign body, left lower leg, subsequent encounter: Secondary | ICD-10-CM | POA: Diagnosis not present

## 2022-03-19 ENCOUNTER — Ambulatory Visit (INDEPENDENT_AMBULATORY_CARE_PROVIDER_SITE_OTHER): Payer: Medicare HMO

## 2022-03-19 ENCOUNTER — Telehealth: Payer: Self-pay | Admitting: Internal Medicine

## 2022-03-19 ENCOUNTER — Encounter: Payer: Self-pay | Admitting: Internal Medicine

## 2022-03-19 ENCOUNTER — Ambulatory Visit (INDEPENDENT_AMBULATORY_CARE_PROVIDER_SITE_OTHER): Payer: Medicare HMO | Admitting: Internal Medicine

## 2022-03-19 VITALS — BP 132/78 | HR 70 | Temp 98.0°F | Resp 16 | Ht 64.0 in | Wt 152.0 lb

## 2022-03-19 DIAGNOSIS — Z0000 Encounter for general adult medical examination without abnormal findings: Secondary | ICD-10-CM | POA: Diagnosis not present

## 2022-03-19 DIAGNOSIS — Z1231 Encounter for screening mammogram for malignant neoplasm of breast: Secondary | ICD-10-CM

## 2022-03-19 DIAGNOSIS — M810 Age-related osteoporosis without current pathological fracture: Secondary | ICD-10-CM

## 2022-03-19 DIAGNOSIS — R058 Other specified cough: Secondary | ICD-10-CM | POA: Diagnosis not present

## 2022-03-19 DIAGNOSIS — I10 Essential (primary) hypertension: Secondary | ICD-10-CM | POA: Diagnosis not present

## 2022-03-19 DIAGNOSIS — R911 Solitary pulmonary nodule: Secondary | ICD-10-CM | POA: Diagnosis not present

## 2022-03-19 DIAGNOSIS — E039 Hypothyroidism, unspecified: Secondary | ICD-10-CM | POA: Diagnosis not present

## 2022-03-19 DIAGNOSIS — Z1211 Encounter for screening for malignant neoplasm of colon: Secondary | ICD-10-CM | POA: Insufficient documentation

## 2022-03-19 DIAGNOSIS — J4540 Moderate persistent asthma, uncomplicated: Secondary | ICD-10-CM | POA: Diagnosis not present

## 2022-03-19 DIAGNOSIS — E785 Hyperlipidemia, unspecified: Secondary | ICD-10-CM | POA: Diagnosis not present

## 2022-03-19 DIAGNOSIS — R918 Other nonspecific abnormal finding of lung field: Secondary | ICD-10-CM

## 2022-03-19 DIAGNOSIS — R059 Cough, unspecified: Secondary | ICD-10-CM | POA: Diagnosis not present

## 2022-03-19 LAB — CBC WITH DIFFERENTIAL/PLATELET
Basophils Absolute: 0 10*3/uL (ref 0.0–0.1)
Basophils Relative: 0.8 % (ref 0.0–3.0)
Eosinophils Absolute: 0.3 10*3/uL (ref 0.0–0.7)
Eosinophils Relative: 4.3 % (ref 0.0–5.0)
HCT: 43.7 % (ref 36.0–46.0)
Hemoglobin: 14.6 g/dL (ref 12.0–15.0)
Lymphocytes Relative: 23 % (ref 12.0–46.0)
Lymphs Abs: 1.4 10*3/uL (ref 0.7–4.0)
MCHC: 33.3 g/dL (ref 30.0–36.0)
MCV: 91.6 fl (ref 78.0–100.0)
Monocytes Absolute: 0.6 10*3/uL (ref 0.1–1.0)
Monocytes Relative: 9 % (ref 3.0–12.0)
Neutro Abs: 3.9 10*3/uL (ref 1.4–7.7)
Neutrophils Relative %: 62.9 % (ref 43.0–77.0)
Platelets: 249 10*3/uL (ref 150.0–400.0)
RBC: 4.77 Mil/uL (ref 3.87–5.11)
RDW: 13.6 % (ref 11.5–15.5)
WBC: 6.2 10*3/uL (ref 4.0–10.5)

## 2022-03-19 LAB — LIPID PANEL
Cholesterol: 208 mg/dL — ABNORMAL HIGH (ref 0–200)
HDL: 60.9 mg/dL (ref >39.00)
LDL Cholesterol: 127 mg/dL — ABNORMAL HIGH (ref 0–99)
NonHDL: 146.71
Total CHOL/HDL Ratio: 3
Triglycerides: 101 mg/dL (ref 0.0–149.0)
VLDL: 20.2 mg/dL (ref 0.0–40.0)

## 2022-03-19 LAB — HEPATIC FUNCTION PANEL
ALT: 24 U/L (ref 0–35)
AST: 29 U/L (ref 0–37)
Albumin: 4.9 g/dL (ref 3.5–5.2)
Alkaline Phosphatase: 60 U/L (ref 39–117)
Bilirubin, Direct: 0.2 mg/dL (ref 0.0–0.3)
Total Bilirubin: 0.8 mg/dL (ref 0.2–1.2)
Total Protein: 8.2 g/dL (ref 6.0–8.3)

## 2022-03-19 LAB — BASIC METABOLIC PANEL
BUN: 17 mg/dL (ref 6–23)
CO2: 29 mEq/L (ref 19–32)
Calcium: 10.1 mg/dL (ref 8.4–10.5)
Chloride: 102 mEq/L (ref 96–112)
Creatinine, Ser: 0.82 mg/dL (ref 0.40–1.20)
GFR: 72.15 mL/min (ref >60.00)
Glucose, Bld: 100 mg/dL — ABNORMAL HIGH (ref 70–99)
Potassium: 3.5 mEq/L (ref 3.5–5.1)
Sodium: 141 mEq/L (ref 135–145)

## 2022-03-19 LAB — VITAMIN D 25 HYDROXY (VIT D DEFICIENCY, FRACTURES): VITD: 63.56 ng/mL (ref 30.00–100.00)

## 2022-03-19 LAB — TSH: TSH: 2.72 u[IU]/mL (ref 0.35–5.50)

## 2022-03-19 MED ORDER — BUDESONIDE-FORMOTEROL FUMARATE 160-4.5 MCG/ACT IN AERO: 2.0000 | INHALATION_SPRAY | Freq: Two times a day (BID) | RESPIRATORY_TRACT | 1 refills | Status: DC

## 2022-03-19 NOTE — Patient Instructions (Signed)

## 2022-03-19 NOTE — Progress Notes (Signed)
Subjective:  Patient ID: Tracey Boyd, female    DOB: [birth year]  Age: 71 y.o. MRN: [id number]  CC: Annual Exam, Cough, Hypothyroidism, and Asthma   HPI [name] presents for a CPX and f/up -   She complains of a several month history of cough productive of yellow phlegm.  She has had some shortness of breath but denies night sweats, fever, chills, loss of appetite, or weight loss.  She has stopped using Evenity because she was not comfortable with how is being billed.  Outpatient Medications Prior to Visit  Medication Sig Dispense Refill   albuterol (PROAIR HFA) 108 (90 Base) MCG/ACT inhaler Inhale 2 puffs into the lungs every 6 (six) hours as needed for wheezing or shortness of breath. 18 g 3   B Complex-C (SUPER B COMPLEX PO) Take 1 tablet by mouth daily.     Calcium Carb-Cholecalciferol (CALCIUM/VITAMIN D PO) Take 1,200 mg by mouth daily.      calcium carbonate (OSCAL) 1500 (600 Ca) MG TABS tablet Take by mouth.     Fexofenadine HCl (MUCINEX ALLERGY PO) Take by mouth.     ketoconazole (NIZORAL) 2 % cream ketoconazole 2 % topical cream  APPLY TO THE AFFECTED AREA(S) BY TOPICAL ROUTE ONCE DAILY     levothyroxine (SYNTHROID) 75 MCG tablet Take 1 tablet (75 mcg total) by mouth daily. (Patient taking differently: Take 75 mcg by mouth every other day.) 90 tablet 0   mometasone (ELOCON) 0.1 % cream Apply topically.     Multiple Vitamin (MULTIVITAMIN WITH MINERALS) TABS tablet Take 1 tablet by mouth daily. Centrum Silver     Omega-3 Fatty Acids (FISH OIL CONCENTRATE PO) Take 1,200 mg by mouth daily.      Propylene Glycol (SYSTANE BALANCE OP) Place 1 drop into both eyes daily.      Respiratory Therapy Supplies (FLUTTER) DEVI Use as directed 1 each 0   SHINGRIX injection      Triamcinolone Acetonide (NASACORT ALLERGY 24HR NA) Place into the nose.     VITAMIN D PO Take by mouth.     SYMBICORT 160-4.5 MCG/ACT inhaler INHALE 2 PUFFS INTO THE LUNGS TWICE DAILY 30.6 g 1   No  facility-administered medications prior to visit.    ROS Review of Systems  Constitutional:  Positive for unexpected weight change (wt gain). Negative for appetite change, chills, diaphoresis and fatigue.  HENT: Negative.    Respiratory:  Positive for cough, shortness of breath and wheezing. Negative for choking, chest tightness and stridor.   Cardiovascular:  Negative for chest pain, palpitations and leg swelling.  Gastrointestinal:  Negative for abdominal pain, constipation, diarrhea, nausea and vomiting.  Endocrine: Negative.   Genitourinary: Negative.  Negative for difficulty urinating.  Musculoskeletal: Negative.  Negative for arthralgias and myalgias.  Skin: Negative.  Negative for color change.  Neurological: Negative.  Negative for dizziness, weakness, light-headedness and headaches.  Hematological:  Negative for adenopathy. Does not bruise/bleed easily.  Psychiatric/Behavioral: Negative.      Objective:  BP 132/78 (BP Location: Left Arm, Patient Position: Sitting, Cuff Size: Large)   Pulse 70   Temp 98 F (36.7 C) (Oral)   Resp 16   Ht '5\' 4"'$  (1.626 m)   Wt 152 lb (68.9 kg)   SpO2 95%   BMI 26.09 kg/m   BP Readings from Last 3 Encounters:  03/19/22 132/78  12/21/21 (!) 143/87  12/12/21 (!) 150/84    Wt Readings from Last 3 Encounters:  03/19/22 152 lb (68.9 kg)  12/21/21 140 lb (63.5 kg)  07/16/21 140 lb (63.5 kg)    Physical Exam Vitals reviewed.  Constitutional:      General: She is not in acute distress.    Appearance: Normal appearance. She is not ill-appearing, toxic-appearing or diaphoretic.  HENT:     Nose: Nose normal.     Mouth/Throat:     Mouth: Mucous membranes are moist.  Eyes:     General: No scleral icterus.    Conjunctiva/sclera: Conjunctivae normal.  Cardiovascular:     Rate and Rhythm: Normal rate and regular rhythm.     Heart sounds: No murmur heard. Pulmonary:     Effort: Pulmonary effort is normal.     Breath sounds: No stridor.  No wheezing, rhonchi or rales.  Abdominal:     General: Abdomen is flat.     Palpations: There is no mass.     Tenderness: There is no abdominal tenderness. There is no guarding or rebound.     Hernia: No hernia is present.  Musculoskeletal:        General: Normal range of motion.     Cervical back: Neck supple.     Right lower leg: No edema.     Left lower leg: No edema.  Lymphadenopathy:     Cervical: No cervical adenopathy.  Skin:    General: Skin is warm and dry.     Coloration: Skin is not pale.     Findings: No rash.  Neurological:     General: No focal deficit present.     Mental Status: She is alert. Mental status is at baseline.  Psychiatric:        Mood and Affect: Mood normal.        Behavior: Behavior normal.     Lab Results  Component Value Date   WBC 6.2 03/19/2022   HGB 14.6 03/19/2022   HCT 43.7 03/19/2022   PLT 249.0 03/19/2022   GLUCOSE 100 (H) 03/19/2022   CHOL 208 (H) 03/19/2022   TRIG 101.0 03/19/2022   HDL 60.90 03/19/2022   LDLCALC 127 (H) 03/19/2022   ALT 24 03/19/2022   AST 29 03/19/2022   NA 141 03/19/2022   K 3.5 03/19/2022   CL 102 03/19/2022   CREATININE 0.82 03/19/2022   BUN 17 03/19/2022   CO2 29 03/19/2022   TSH 2.72 03/19/2022    DG Tibia/Fibula Left  Result Date: 12/12/2021 CLINICAL DATA:  Trauma, pain EXAM: LEFT TIBIA AND FIBULA - 2 VIEW COMPARISON:  None. FINDINGS: No displaced fracture or dislocation is seen. There are no opaque foreign bodies. IMPRESSION: No fracture or dislocation is seen in the left tibia and fibula. Electronically Signed   By: Elmer Picker M.D.   On: 12/12/2021 16:10   DG Chest 2 View  Result Date: 03/19/2022 CLINICAL DATA:  Cough EXAM: CHEST - 2 VIEW COMPARISON:  October 2020 FINDINGS: The heart size and mediastinal contours are within normal limits. Small opacity right middle lobe probably reflects atelectasis. No pleural effusion. The visualized skeletal structures are unremarkable. IMPRESSION:  Small right middle lobe opacity probably reflects atelectasis. Consider chest CT to evaluate for obstructing endobronchial lesion if symptoms are of longer duration or progressive. Electronically Signed   By: Macy Mis M.D.   On: 03/19/2022 11:45     CT Chest W Contrast  Result Date: 03/27/2022 CLINICAL DATA:  Lung nodule EXAM: CT CHEST WITH CONTRAST TECHNIQUE: Multidetector CT imaging of the chest was performed during intravenous contrast administration. RADIATION DOSE  REDUCTION: This exam was performed according to the departmental dose-optimization program which includes automated exposure control, adjustment of the mA and/or kV according to patient size and/or use of iterative reconstruction technique. CONTRAST:  12m OMNIPAQUE IOHEXOL 300 MG/ML  SOLN COMPARISON:  Previous studies including the chest radiographs done on 03/19/2022 FINDINGS: Cardiovascular: There is homogeneous enhancement in thoracic aorta. There is ectasia of the ascending thoracic aorta measuring 3.4 cm. There are no intraluminal filling defects seen central pulmonary artery branches. Heart is enlarged in size. Mediastinum/Nodes: No significant lymphadenopathy is seen. Lungs/Pleura: There are linear densities in medial segment of right middle lobe and lingula. There is 2 mm calcification in the medial segment of right middle lobe suggesting a granuloma. No focal pulmonary consolidation is seen. There are no discrete lung nodules. There is no pleural effusion or pneumothorax. Upper Abdomen: There is fatty infiltration liver. Musculoskeletal: Hemangioma is seen in the body of T10 vertebra. IMPRESSION: There are linear densities in the lower lung fields, especially both cardiophrenic angles suggesting scarring or subsegmental atelectasis. There is no focal pulmonary consolidation. There are no discrete lung nodules. There is no significant lymphadenopathy. There is mild ectasia of ascending thoracic aorta measuring 3.4 cm. Fatty liver.  Electronically Signed   By: PElmer PickerM.D.   On: 03/27/2022 13:53     Assessment & Plan:   BTrinidiwas seen today for annual exam, cough, hypothyroidism and asthma.  Diagnoses and all orders for this visit:  Essential hypertension- Her BP is well controlled. -     Basic metabolic panel; Future -     CBC with Differential/Platelet; Future -     TSH; Future -     Hepatic function panel; Future -     Hepatic function panel -     TSH -     CBC with Differential/Platelet -     Basic metabolic panel  Asthma, moderate persistent, well-controlled- Will continue the LABA/ICS inhaler. -     CBC with Differential/Platelet; Future -     CBC with Differential/Platelet -     budesonide-formoterol (SYMBICORT) 160-4.5 MCG/ACT inhaler; Inhale 2 puffs into the lungs 2 (two) times daily.  Acquired hypothyroidism- She is euthyroid. -     TSH; Future -     TSH  Hyperlipidemia with target LDL less than 130- Statin not indicated. -     Lipid panel; Future -     Lipid panel  Routine general medical examination at a health care facility- Exam completed, labs reviewed, vaccines are up-to-date, cancer screenings addressed, pt ed was given.  Age-related osteoporosis without current pathological fracture- Will change to Prolia.   -     Basic metabolic panel; Future -     Hepatic function panel; Future -     VITAMIN D 25 Hydroxy (Vit-D Deficiency, Fractures); Future -     VITAMIN D 25 Hydroxy (Vit-D Deficiency, Fractures) -     Hepatic function panel -     Basic metabolic panel  Cough productive of yellow sputum- Her chest x-ray is positive for new nodule on the right side.  Will get a CT scan with contrast to screen for infection/malignancy. -     DG Chest 2 View; Future -     CT Chest W Contrast; Future  Screen for colon cancer -     Ambulatory referral to Gastroenterology  Visit for screening mammogram -     MM DIGITAL SCREENING BILATERAL; Future  Right middle lobe pulmonary  nodule -  CT Chest W Contrast; Future  Abnormal findings on diagnostic imaging of lung -     CT Chest W Contrast; Future   I have changed Arvie Peragine's Symbicort to budesonide-formoterol. I am also having her maintain her Flutter, multivitamin with minerals, Propylene Glycol (SYSTANE BALANCE OP), Omega-3 Fatty Acids (FISH OIL CONCENTRATE PO), albuterol, Calcium Carb-Cholecalciferol (CALCIUM/VITAMIN D PO), B Complex-C (SUPER B COMPLEX PO), Triamcinolone Acetonide (NASACORT ALLERGY 24HR NA), VITAMIN D PO, Fexofenadine HCl (MUCINEX ALLERGY PO), calcium carbonate, ketoconazole, mometasone, Shingrix, and levothyroxine.  Meds ordered this encounter  Medications   budesonide-formoterol (SYMBICORT) 160-4.5 MCG/ACT inhaler    Sig: Inhale 2 puffs into the lungs 2 (two) times daily.    Dispense:  30.6 g    Refill:  1   In addition to time spent on CPE, I spent 50 minutes in preparing to see the patient by review of recent labs and images obtaining and reviewing separately obtained history, communicating with the patient , ordering medications and xrays, and documenting clinical information in the EHR including the differential Dx, treatment, and any further evaluation and management of multiple complex medical issues.     Follow-up: Return in about 3 months (around 06/19/2022).  Scarlette Calico, MD

## 2022-03-19 NOTE — Telephone Encounter (Signed)
Caller & Relationship to patient: Tracey Boyd  Call back number: [phone]  Date of last office visit: 03/19/22  Date of next office visit:   Medication(s) to be refilled:  SYMBICORT 160-4.5 MCG/ACT inhaler      Preferred Pharmacy:  [id number] [place] [place] [place] [address]:  [phone] Fax:  [phone]

## 2022-03-21 NOTE — Telephone Encounter (Signed)
Prolia VOB initiated via [email or web address]  Transition from [place] (discontinued)

## 2022-03-21 NOTE — Telephone Encounter (Signed)
Tracey Lima, MD  [name], CMA She has stopped evenity  Can we set her up for prolia?

## 2022-03-24 NOTE — Telephone Encounter (Signed)
Prior auth required for PROLIA  PA PROCESS DETAILS: Precertification is required. Call [phone] or complete the Precertification form available at [email or web address] 

## 2022-03-26 MED ORDER — LEVOTHYROXINE SODIUM 75 MCG PO TABS: 75.0000 ug | ORAL_TABLET | ORAL | 1 refills | Status: DC

## 2022-03-27 ENCOUNTER — Ambulatory Visit (HOSPITAL_BASED_OUTPATIENT_CLINIC_OR_DEPARTMENT_OTHER)
Admission: RE | Admit: 2022-03-27 | Discharge: 2022-03-27 | Disposition: A | Discharge status: Home / Self Care | Payer: Medicare HMO | Source: Ambulatory Visit | Attending: Internal Medicine | Admitting: Internal Medicine

## 2022-03-27 DIAGNOSIS — R918 Other nonspecific abnormal finding of lung field: Secondary | ICD-10-CM | POA: Diagnosis not present

## 2022-03-27 DIAGNOSIS — R911 Solitary pulmonary nodule: Secondary | ICD-10-CM | POA: Insufficient documentation

## 2022-03-27 DIAGNOSIS — J9811 Atelectasis: Secondary | ICD-10-CM | POA: Diagnosis not present

## 2022-03-27 DIAGNOSIS — R058 Other specified cough: Secondary | ICD-10-CM | POA: Diagnosis not present

## 2022-03-27 MED ORDER — IOHEXOL 300 MG/ML  SOLN
100.0000 mL | Freq: Once | INTRAMUSCULAR | Status: AC | PRN
  Administered 2022-03-27: 75 mL via INTRAVENOUS

## 2022-03-28 NOTE — Telephone Encounter (Signed)
Prior Authorization initiated for [facility] via [employer] Case ID: [id number]

## 2022-03-29 DIAGNOSIS — H2513 Age-related nuclear cataract, bilateral: Secondary | ICD-10-CM | POA: Diagnosis not present

## 2022-03-29 DIAGNOSIS — H5203 Hypermetropia, bilateral: Secondary | ICD-10-CM | POA: Diagnosis not present

## 2022-04-01 ENCOUNTER — Telehealth: Payer: Self-pay | Admitting: Internal Medicine

## 2022-04-01 DIAGNOSIS — Z01419 Encounter for gynecological examination (general) (routine) without abnormal findings: Secondary | ICD-10-CM | POA: Diagnosis not present

## 2022-04-01 DIAGNOSIS — Z1231 Encounter for screening mammogram for malignant neoplasm of breast: Secondary | ICD-10-CM | POA: Diagnosis not present

## 2022-04-01 DIAGNOSIS — Z6826 Body mass index (BMI) 26.0-26.9, adult: Secondary | ICD-10-CM | POA: Diagnosis not present

## 2022-04-01 NOTE — Telephone Encounter (Signed)
LVM to discuss

## 2022-04-01 NOTE — Telephone Encounter (Signed)
Pt called in and states she has been on Evenity for 1x month for 1 year, and now she received an Emergency planning/management officer and states she has been approved to take Prolia.   She states this was not mentioned at her last visit.   Requesting call from assistant as to why/ how long she will have to take these injections.

## 2022-04-03 DIAGNOSIS — Z0100 Encounter for examination of eyes and vision without abnormal findings: Secondary | ICD-10-CM | POA: Diagnosis not present

## 2022-04-03 NOTE — Telephone Encounter (Signed)
PA# [id number] Valid: 03/28/22-03/29/23

## 2022-04-03 NOTE — Telephone Encounter (Signed)
Pt ready for scheduling on or after 03/28/22  Out-of-pocket cost due at time of visit: $301  Primary: A[employer]na Medicare Adv Prolia co-insurance: 20% (approximately $276) Admin fee co-insurance: 20% (approximately $25)  Secondary: n/a Prolia co-insurance:  Admin fee co-insurance:   Deductible: does not apply  Prior Auth: APPROVED PA# [id number] Valid: 03/28/22-03/29/23  ** This summary of benefits is an estimation of the patient's out-of-pocket cost. Exact cost may vary based on individual plan coverage.

## 2022-05-06 DIAGNOSIS — D1801 Hemangioma of skin and subcutaneous tissue: Secondary | ICD-10-CM | POA: Diagnosis not present

## 2022-05-06 DIAGNOSIS — L821 Other seborrheic keratosis: Secondary | ICD-10-CM | POA: Diagnosis not present

## 2022-05-06 DIAGNOSIS — L812 Freckles: Secondary | ICD-10-CM | POA: Diagnosis not present

## 2022-05-06 DIAGNOSIS — L720 Epidermal cyst: Secondary | ICD-10-CM | POA: Diagnosis not present

## 2022-06-12 ENCOUNTER — Ambulatory Visit (AMBULATORY_SURGERY_CENTER): Payer: Self-pay | Admitting: *Deleted

## 2022-06-12 VITALS — Ht 63.0 in | Wt 157.0 lb

## 2022-06-12 DIAGNOSIS — Z1211 Encounter for screening for malignant neoplasm of colon: Secondary | ICD-10-CM

## 2022-06-12 MED ORDER — PEG 3350-KCL-NA BICARB-NACL 420 G PO SOLR: 4000.0000 mL | Freq: Once | ORAL | 0 refills | Status: AC

## 2022-06-12 NOTE — Progress Notes (Signed)
No egg or soy allergy known to patient  No issues known to pt with past sedation with any surgeries or procedures Patient denies ever being told they had issues or difficulty with intubation  No FH of Malignant Hyperthermia Pt is not on diet pills Pt is not on home 02  Pt is not on blood thinners  Pt denies issues with constipation  No A fib or A flutter Have any cardiac testing pending--NO Pt instructed to use [email or web address] or GoodRx for a price reduction on prep   

## 2022-06-14 ENCOUNTER — Other Ambulatory Visit: Payer: Self-pay | Admitting: Internal Medicine

## 2022-06-14 ENCOUNTER — Telehealth: Payer: Self-pay | Admitting: Internal Medicine

## 2022-06-14 DIAGNOSIS — E039 Hypothyroidism, unspecified: Secondary | ICD-10-CM

## 2022-06-14 MED ORDER — LEVOTHYROXINE SODIUM 75 MCG PO TABS: 75.0000 ug | ORAL_TABLET | ORAL | 1 refills | Status: DC

## 2022-06-14 NOTE — Telephone Encounter (Signed)
Patient needs her 75 mcg thryroid medication sent in to [facility]

## 2022-06-28 ENCOUNTER — Encounter: Payer: Self-pay | Admitting: Gastroenterology

## 2022-07-03 ENCOUNTER — Telehealth: Payer: Self-pay

## 2022-07-03 NOTE — Telephone Encounter (Signed)
Pt has been informed and expressed understanding.  

## 2022-07-03 NOTE — Telephone Encounter (Signed)
Patient called in wanting to know if she is done with Evenity injections.

## 2022-07-08 ENCOUNTER — Ambulatory Visit (AMBULATORY_SURGERY_CENTER): Payer: Medicare HMO | Admitting: Gastroenterology

## 2022-07-08 ENCOUNTER — Encounter: Payer: Self-pay | Admitting: Gastroenterology

## 2022-07-08 VITALS — BP 114/71 | HR 59 | Temp 97.3°F | Resp 11 | Ht 63.0 in | Wt 157.0 lb

## 2022-07-08 DIAGNOSIS — D120 Benign neoplasm of cecum: Secondary | ICD-10-CM | POA: Diagnosis not present

## 2022-07-08 DIAGNOSIS — I10 Essential (primary) hypertension: Secondary | ICD-10-CM | POA: Diagnosis not present

## 2022-07-08 DIAGNOSIS — E039 Hypothyroidism, unspecified: Secondary | ICD-10-CM | POA: Diagnosis not present

## 2022-07-08 DIAGNOSIS — R69 Illness, unspecified: Secondary | ICD-10-CM | POA: Diagnosis not present

## 2022-07-08 DIAGNOSIS — Z1211 Encounter for screening for malignant neoplasm of colon: Secondary | ICD-10-CM | POA: Diagnosis not present

## 2022-07-08 DIAGNOSIS — J45909 Unspecified asthma, uncomplicated: Secondary | ICD-10-CM | POA: Diagnosis not present

## 2022-07-08 MED ORDER — SODIUM CHLORIDE 0.9 % IV SOLN: 500.0000 mL | Freq: Once | INTRAVENOUS | Status: DC

## 2022-07-08 NOTE — Progress Notes (Signed)
To pacu, VSS. Report to [email or web address].tb 

## 2022-07-08 NOTE — Progress Notes (Signed)
History and Physical:  This patient presents for endoscopic testing for: Encounter Diagnosis  Name Primary?   Special screening for malignant neoplasms, colon Yes    71 year old woman here today for screening colonoscopy.  No polyps were found on colonoscopy in 2013, and no polyps on colonoscopy in 2003 Patient denies chronic abdominal pain, rectal bleeding, constipation or diarrhea.   Patient is otherwise without complaints or active issues today.   Past Medical History: Past Medical History:  Diagnosis Date   Allergy    Anemia    as a child   Anxiety    Asthma    Dermatitis herpetiformis    GERD (gastroesophageal reflux disease)    HTN (hypertension)    Hypothyroidism    Osteoporosis      Past Surgical History: Past Surgical History:  Procedure Laterality Date   APPENDECTOMY     BIOPSY  11/01/2019   Procedure: BIOPSY;  Surgeon: Doran Stabler, MD;  Location: [facility];  Service: Gastroenterology;;   [facility] STUDY N/A 11/01/2019   Procedure: [facility] [place];  Surgeon: Doran Stabler, MD;  Location: [facility];  Service: Gastroenterology;  Laterality: N/A;   COLONOSCOPY     CYSTECTOMY     ESOPHAGOGASTRODUODENOSCOPY (EGD) WITH PROPOFOL N/A 11/01/2019   Procedure: ESOPHAGOGASTRODUODENOSCOPY (EGD) WITH PROPOFOL;  Surgeon: Doran Stabler, MD;  Location: [facility];  Service: Gastroenterology;  Laterality: N/A;   ORIF WRIST FRACTURE Left 11/08/2016   Procedure: OPEN REDUCTION INTERNAL FIXATION (ORIF) LEFT WRIST FRACTURE;  Surgeon: Roseanne Kaufman, MD;  Location: [facility];  Service: Orthopedics;  Laterality: Left;    Allergies: Allergies  Allergen Reactions   Ace Inhibitors Cough    Reaction to lisinopril   Penicillins Hives    Has patient had a PCN reaction causing immediate rash, facial/tongue/throat swelling, SOB or lightheadedness with hypotension: Yes Has patient had a PCN reaction causing severe rash involving mucus membranes or skin necrosis: No Has  patient had a PCN reaction that required hospitalization No Has patient had a PCN reaction occurring within the last 10 years: No If all of the above answers are "NO", then may proceed with Cephalosporin use.   Prednisone Other (See Comments)    REACTION: paresthesias   Sulfa Antibiotics Hives   Sulfonamide Derivatives Hives   Tetracycline Other (See Comments)    Outpatient Meds: Current Outpatient Medications  Medication Sig Dispense Refill   B Complex-C (SUPER B COMPLEX PO) Take 1 tablet by mouth daily.     budesonide-formoterol (SYMBICORT) 160-4.5 MCG/ACT inhaler Inhale 2 puffs into the lungs 2 (two) times daily. 30.6 g 1   Calcium Carb-Cholecalciferol (CALCIUM/VITAMIN D PO) Take 1,200 mg by mouth daily.      calcium carbonate (OSCAL) 1500 (600 Ca) MG TABS tablet Take by mouth.     levothyroxine (SYNTHROID) 75 MCG tablet Take 1 tablet (75 mcg total) by mouth every other day. 45 tablet 1   Multiple Vitamin (MULTIVITAMIN WITH MINERALS) TABS tablet Take 1 tablet by mouth daily. Centrum Silver     Omega-3 Fatty Acids (FISH OIL CONCENTRATE PO) Take 1,200 mg by mouth daily.      Propylene Glycol (SYSTANE BALANCE OP) Place 1 drop into both eyes daily.      VITAMIN D PO Take by mouth.     albuterol (PROAIR HFA) 108 (90 Base) MCG/ACT inhaler Inhale 2 puffs into the lungs every 6 (six) hours as needed for wheezing or shortness of breath. 18 g 3   Fexofenadine  HCl (MUCINEX ALLERGY PO) Take by mouth as needed.     ketoconazole (NIZORAL) 2 % cream ketoconazole 2 % topical cream  APPLY TO THE AFFECTED AREA(S) BY TOPICAL ROUTE ONCE DAILY     mometasone (ELOCON) 0.1 % cream Apply topically.     Respiratory Therapy Supplies (FLUTTER) DEVI Use as directed (Patient not taking: Reported on 06/12/2022) 1 each 0   Triamcinolone Acetonide (NASACORT ALLERGY 24HR NA) Place into the nose as needed.     Current Facility-Administered Medications  Medication Dose Route Frequency Provider Last Rate Last Admin    0.9 %  sodium chloride infusion  500 mL Intravenous Once Danis, Estill Cotta III, MD          ___________________________________________________________________ Objective   Exam:  BP (!) 138/90   Pulse 72   Temp (!) 97.3 F (36.3 C) (Temporal)   Ht '5\' 3"'$  (1.6 m)   Wt 157 lb (71.2 kg)   SpO2 97%   BMI 27.81 kg/m   CV: regular , S1/S2 Resp: clear to auscultation bilaterally, normal RR and effort noted GI: soft, no tenderness, with active bowel sounds.   Assessment: Encounter Diagnosis  Name Primary?   Special screening for malignant neoplasms, colon Yes     Plan: Colonoscopy    The patient is appropriate for an endoscopic procedure in the ambulatory setting.   - Wilfrid Lund, MD

## 2022-07-08 NOTE — Progress Notes (Signed)
Called to room to assist during endoscopic procedure.  Patient ID and intended procedure confirmed with present staff. Received instructions for my participation in the procedure from the performing physician.  

## 2022-07-08 NOTE — Progress Notes (Signed)
Pt's states no medical or surgical changes since previsit or office visit. 

## 2022-07-08 NOTE — Patient Instructions (Signed)
Handouts Provided:  Polyps  YOU HAD AN ENDOSCOPIC PROCEDURE TODAY AT THE [facility]:   Refer to the procedure report that was given to you for any specific questions about what was found during the examination.  If the procedure report does not answer your questions, please call your gastroenterologist to clarify.  If you requested that your care partner not be given the details of your procedure findings, then the procedure report has been included in a sealed envelope for you to review at your convenience later.  YOU SHOULD EXPECT: Some feelings of bloating in the abdomen. Passage of more gas than usual.  Walking can help get rid of the air that was put into your GI tract during the procedure and reduce the bloating. If you had a lower endoscopy (such as a colonoscopy or flexible sigmoidoscopy) you may notice spotting of blood in your stool or on the toilet paper. If you underwent a bowel prep for your procedure, you may not have a normal bowel movement for a few days.  Please Note:  You might notice some irritation and congestion in your nose or some drainage.  This is from the oxygen used during your procedure.  There is no need for concern and it should clear up in a day or so.  SYMPTOMS TO REPORT IMMEDIATELY:  Following lower endoscopy (colonoscopy or flexible sigmoidoscopy):  Excessive amounts of blood in the stool  Significant tenderness or worsening of abdominal pains  Swelling of the abdomen that is new, acute  Fever of 100F or higher  For urgent or emergent issues, a gastroenterologist can be reached at any hour by calling [phone]. Do not use MyChart messaging for urgent concerns.    DIET:  We do recommend a small meal at first, but then you may proceed to your regular diet.  Drink plenty of fluids but you should avoid alcoholic beverages for 24 hours.  ACTIVITY:  You should plan to take it easy for the rest of today and you should NOT DRIVE or use heavy  machinery until tomorrow (because of the sedation medicines used during the test).    FOLLOW UP: Our staff will call the number listed on your records the next business day following your procedure.  We will call around 7:15- 8:00 am to check on you and address any questions or concerns that you may have regarding the information given to you following your procedure. If we do not reach you, we will leave a message.     If any biopsies were taken you will be contacted by phone or by letter within the next 1-3 weeks.  Please call us at [phone] if you have not heard about the biopsies in 3 weeks.    SIGNATURES/CONFIDENTIALITY: You and/or your care partner have signed paperwork which will be entered into your electronic medical record.  These signatures attest to the fact that that the information above on your After Visit Summary has been reviewed and is understood.  Full responsibility of the confidentiality of this discharge information lies with you and/or your care-partner.  

## 2022-07-08 NOTE — Op Note (Signed)
[facility] Patient Name: Tracey Boyd Procedure Date: 07/08/2022 10:47 AM MRN: [id number] Endoscopist: Mallie Mussel L. Loletha Carrow , MD, [id number] Age: 71 Referring MD:  Date of Birth: 1951/06/27 Gender: Female Account #: [id number] Procedure:                Colonoscopy Indications:              Screening for colorectal malignant neoplasm                           no polyps in 2013 or 2003 Medicines:                Monitored Anesthesia Care Procedure:                Pre-Anesthesia Assessment:                           - Prior to the procedure, a History and Physical                            was performed, and patient medications and                            allergies were reviewed. The patient's tolerance of                            previous anesthesia was also reviewed. The risks                            and benefits of the procedure and the sedation                            options and risks were discussed with the patient.                            All questions were answered, and informed consent                            was obtained. Prior Anticoagulants: The patient has                            taken no anticoagulant or antiplatelet agents. ASA                            Grade Assessment: II - A patient with mild systemic                            disease. After reviewing the risks and benefits,                            the patient was deemed in satisfactory condition to                            undergo the procedure.  After obtaining informed consent, the colonoscope                            was passed under direct vision. Throughout the                            procedure, the patient's blood pressure, pulse, and                            oxygen saturations were monitored continuously. The                            CF [id number] #[id number] was introduced through the anus                            and advanced to the the cecum,  identified by                            appendiceal orifice and ileocecal valve. The                            colonoscopy was performed with difficulty due to a                            redundant colon and significant looping. Successful                            completion of the procedure was aided by                            straightening and shortening the scope to obtain                            bowel loop reduction. The patient tolerated the                            procedure well. The quality of the bowel                            preparation was good. The ileocecal valve,                            appendiceal orifice, and rectum were photographed.                            The bowel preparation used was GoLYTELY. Scope In: 11:01:19 AM Scope Out: 11:18:34 AM Scope Withdrawal Time: 0 hours 8 minutes 13 seconds  Total Procedure Duration: 0 hours 17 minutes 15 seconds  Findings:                 The perianal and digital rectal examinations were                            normal.  A 4 mm polyp was found in the cecum. The polyp was                            sessile. The polyp was removed with a cold snare.                            Resection and retrieval were complete.                           Repeat examination of right colon under NBI                            performed.                           There was a lipoma, in the transverse colon.                           The colon (entire examined portion) was redundant.                           The exam was otherwise without abnormality on                            direct and retroflexion views. Complications:            No immediate complications. Estimated Blood Loss:     Estimated blood loss was minimal. Impression:               - One 4 mm polyp in the cecum, removed with a cold                            snare. Resected and retrieved.                           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                           - Redundant colon.                           - The examination was otherwise normal on direct                            and retroflexion views. Recommendation:           - Patient has a contact number available for                            emergencies. The signs and symptoms of potential                            delayed complications were discussed with the                            patient. Return to normal  activities tomorrow.                            Written discharge instructions were provided to the                            patient.                           - Resume previous diet.                           - Continue present medications.                           - Await pathology results.                           - Repeat colonoscopy is recommended for                            surveillance. The colonoscopy date will be                            determined after pathology results from today's                            exam become available for review. Tracey Boyd L. Loletha Carrow, MD 07/08/2022 11:24:18 AM This report has been signed electronically.

## 2022-07-09 ENCOUNTER — Telehealth: Payer: Self-pay

## 2022-07-09 NOTE — Telephone Encounter (Signed)
  Follow up Call-     07/08/2022   10:06 AM  Call back number  Post procedure Call Back phone  # [phone]  Permission to leave phone message Yes     Patient questions:  Do you have a fever, pain , or abdominal swelling? No. Pain Score  0 *  Have you tolerated food without any problems? Yes.    Have you been able to return to your normal activities? Yes.    Do you have any questions about your discharge instructions: Diet   No. Medications  No. Follow up visit  No.  Do you have questions or concerns about your Care? No.  Actions: * If pain score is 4 or above: No action needed, pain <4.

## 2022-07-11 ENCOUNTER — Telehealth: Payer: Self-pay | Admitting: Internal Medicine

## 2022-07-11 ENCOUNTER — Other Ambulatory Visit: Payer: Self-pay | Admitting: Internal Medicine

## 2022-07-11 DIAGNOSIS — K219 Gastro-esophageal reflux disease without esophagitis: Secondary | ICD-10-CM

## 2022-07-11 MED ORDER — OMEPRAZOLE 40 MG PO CPDR: 40.0000 mg | DELAYED_RELEASE_CAPSULE | Freq: Every day | ORAL | 0 refills | Status: DC

## 2022-07-11 NOTE — Telephone Encounter (Signed)
Pt call in RX. Not prescribed by PCP before:  OMEPRAZOLE 40 MG for GERD  (returning symptoms)  [facility] pharmacy  COMMENTS: 2 week supply at home. Understands she can get over-the-counter but would like prescription to her pharmacy  Last visit: 03/19/22

## 2022-07-11 NOTE — Telephone Encounter (Signed)
Next appointment 08/07/21

## 2022-07-12 ENCOUNTER — Encounter: Payer: Self-pay | Admitting: Gastroenterology

## 2022-07-12 NOTE — Telephone Encounter (Signed)
Patient called back to follow up. Has the prolia injection been approved?

## 2022-07-16 DIAGNOSIS — Z008 Encounter for other general examination: Secondary | ICD-10-CM | POA: Diagnosis not present

## 2022-07-16 DIAGNOSIS — H269 Unspecified cataract: Secondary | ICD-10-CM | POA: Diagnosis not present

## 2022-07-16 DIAGNOSIS — R69 Illness, unspecified: Secondary | ICD-10-CM | POA: Diagnosis not present

## 2022-07-16 DIAGNOSIS — K219 Gastro-esophageal reflux disease without esophagitis: Secondary | ICD-10-CM | POA: Diagnosis not present

## 2022-07-16 DIAGNOSIS — E039 Hypothyroidism, unspecified: Secondary | ICD-10-CM | POA: Diagnosis not present

## 2022-07-16 DIAGNOSIS — Z811 Family history of alcohol abuse and dependence: Secondary | ICD-10-CM | POA: Diagnosis not present

## 2022-07-16 DIAGNOSIS — I10 Essential (primary) hypertension: Secondary | ICD-10-CM | POA: Diagnosis not present

## 2022-07-16 DIAGNOSIS — J45909 Unspecified asthma, uncomplicated: Secondary | ICD-10-CM | POA: Diagnosis not present

## 2022-07-16 NOTE — Telephone Encounter (Signed)
RX completed 07/11/22

## 2022-07-16 NOTE — Telephone Encounter (Signed)
Forwarding to [employer].

## 2022-07-17 ENCOUNTER — Other Ambulatory Visit (HOSPITAL_COMMUNITY): Payer: Self-pay

## 2022-07-17 NOTE — Telephone Encounter (Signed)
Pharmacy Patient Advocate Encounter  Insurance verification completed.    The patient is insured through [employer] for: Prolia '60mg'$ .  Pharmacy benefit copay: $250.00

## 2022-07-19 ENCOUNTER — Other Ambulatory Visit (HOSPITAL_COMMUNITY): Payer: Self-pay

## 2022-07-19 NOTE — Telephone Encounter (Signed)
[place] verification of benefits submitted

## 2022-07-22 NOTE — Telephone Encounter (Signed)
Pt ready for scheduling on or after 07/22/22   Out-of-pocket cost due at time of visit: $301   Primary: [employer] Adv Prolia co-insurance: 20% (approximately $276) Admin fee co-insurance: 20% (approximately $25)   Secondary: n/a Prolia co-insurance:  Admin fee co-insurance:    Deductible: does not apply   Prior Auth: APPROVED PA# [id number] Valid: 03/28/22-03/29/23   ** This summary of benefits is an estimation of the patient's out-of-pocket cost. Exact cost may vary based on individual plan coverage.

## 2022-08-07 ENCOUNTER — Encounter: Payer: Self-pay | Admitting: Internal Medicine

## 2022-08-07 ENCOUNTER — Ambulatory Visit (INDEPENDENT_AMBULATORY_CARE_PROVIDER_SITE_OTHER): Payer: Medicare HMO | Admitting: Internal Medicine

## 2022-08-07 VITALS — BP 126/88 | HR 83 | Temp 98.4°F | Ht 63.0 in | Wt 158.0 lb

## 2022-08-07 DIAGNOSIS — J370 Chronic laryngitis: Secondary | ICD-10-CM

## 2022-08-07 DIAGNOSIS — M810 Age-related osteoporosis without current pathological fracture: Secondary | ICD-10-CM

## 2022-08-07 MED ORDER — DENOSUMAB 60 MG/ML ~~LOC~~ SOSY
60.0000 mg | PREFILLED_SYRINGE | Freq: Once | SUBCUTANEOUS | Status: AC
  Administered 2022-08-07: 60 mg via SUBCUTANEOUS

## 2022-08-07 NOTE — Progress Notes (Signed)
Subjective:  Patient ID: Tracey Boyd, female    DOB: 1951-05-14  Age: 71 y.o. MRN: [id number]  CC: Gastroesophageal Reflux and Hypothyroidism   HPI Toshua Honsinger presents for f/up -  She complains of chronic laryngitis.  She is intermittently compliant with the PPI but has been taking it recently.  She denies cough, hemoptysis, chest pain, odynophagia, dysphagia, loss of appetite, or weight loss.  Outpatient Medications Prior to Visit  Medication Sig Dispense Refill   albuterol (PROAIR HFA) 108 (90 Base) MCG/ACT inhaler Inhale 2 puffs into the lungs every 6 (six) hours as needed for wheezing or shortness of breath. 18 g 3   B Complex-C (SUPER B COMPLEX PO) Take 1 tablet by mouth daily.     budesonide-formoterol (SYMBICORT) 160-4.5 MCG/ACT inhaler Inhale 2 puffs into the lungs 2 (two) times daily. 30.6 g 1   Calcium Carb-Cholecalciferol (CALCIUM/VITAMIN D PO) Take 1,200 mg by mouth daily.      calcium carbonate (OSCAL) 1500 (600 Ca) MG TABS tablet Take by mouth.     Fexofenadine HCl (MUCINEX ALLERGY PO) Take by mouth as needed.     ketoconazole (NIZORAL) 2 % cream ketoconazole 2 % topical cream  APPLY TO THE AFFECTED AREA(S) BY TOPICAL ROUTE ONCE DAILY     levothyroxine (SYNTHROID) 75 MCG tablet Take 1 tablet (75 mcg total) by mouth every other day. 45 tablet 1   mometasone (ELOCON) 0.1 % cream Apply topically.     Multiple Vitamin (MULTIVITAMIN WITH MINERALS) TABS tablet Take 1 tablet by mouth daily. Centrum Silver     Omega-3 Fatty Acids (FISH OIL CONCENTRATE PO) Take 1,200 mg by mouth daily.      omeprazole (PRILOSEC) 40 MG capsule Take 1 capsule (40 mg total) by mouth daily. 90 capsule 0   Propylene Glycol (SYSTANE BALANCE OP) Place 1 drop into both eyes daily.      Respiratory Therapy Supplies (FLUTTER) DEVI Use as directed 1 each 0   Triamcinolone Acetonide (NASACORT ALLERGY 24HR NA) Place into the nose as needed.     VITAMIN D PO Take by mouth.     No facility-administered  medications prior to visit.    ROS Review of Systems  Constitutional: Negative.  Negative for diaphoresis and fatigue.  HENT:  Positive for voice change. Negative for sore throat and trouble swallowing.   Eyes: Negative.   Respiratory:  Positive for wheezing. Negative for cough, chest tightness and shortness of breath.   Cardiovascular:  Negative for chest pain, palpitations and leg swelling.  Gastrointestinal:  Negative for abdominal pain, diarrhea and nausea.  Genitourinary: Negative.  Negative for difficulty urinating and dysuria.  Musculoskeletal: Negative.   Skin: Negative.   Neurological: Negative.  Negative for dizziness and weakness.  Hematological:  Negative for adenopathy. Does not bruise/bleed easily.  Psychiatric/Behavioral: Negative.      Objective:  BP 126/88 (BP Location: Left Arm, Patient Position: Sitting, Cuff Size: Large)   Pulse 83   Temp 98.4 F (36.9 C) (Oral)   Ht '5\' 3"'$  (1.6 m)   Wt 158 lb (71.7 kg)   SpO2 94%   BMI 27.99 kg/m   BP Readings from Last 3 Encounters:  08/07/22 126/88  07/08/22 114/71  03/19/22 132/78    Wt Readings from Last 3 Encounters:  08/07/22 158 lb (71.7 kg)  07/08/22 157 lb (71.2 kg)  06/12/22 157 lb (71.2 kg)    Physical Exam Vitals reviewed.  HENT:     Mouth/Throat:     Mouth:  Mucous membranes are moist.  Eyes:     General: No scleral icterus.    Conjunctiva/sclera: Conjunctivae normal.  Cardiovascular:     Rate and Rhythm: Normal rate and regular rhythm.     Heart sounds: No murmur heard. Pulmonary:     Effort: Pulmonary effort is normal.     Breath sounds: No stridor. No wheezing, rhonchi or rales.  Abdominal:     General: Abdomen is flat.     Palpations: There is no mass.     Tenderness: There is no abdominal tenderness. There is no guarding.     Hernia: No hernia is present.  Musculoskeletal:        General: Normal range of motion.     Cervical back: Neck supple.     Right lower leg: No edema.     Left  lower leg: No edema.  Lymphadenopathy:     Cervical: No cervical adenopathy.  Skin:    General: Skin is warm and dry.  Neurological:     Mental Status: She is alert.  Psychiatric:        Mood and Affect: Mood normal.     Lab Results  Component Value Date   WBC 6.2 03/19/2022   HGB 14.6 03/19/2022   HCT 43.7 03/19/2022   PLT 249.0 03/19/2022   GLUCOSE 100 (H) 03/19/2022   CHOL 208 (H) 03/19/2022   TRIG 101.0 03/19/2022   HDL 60.90 03/19/2022   LDLCALC 127 (H) 03/19/2022   ALT 24 03/19/2022   AST 29 03/19/2022   NA 141 03/19/2022   K 3.5 03/19/2022   CL 102 03/19/2022   CREATININE 0.82 03/19/2022   BUN 17 03/19/2022   CO2 29 03/19/2022   TSH 2.72 03/19/2022    CT Chest W Contrast  Result Date: 03/27/2022 CLINICAL DATA:  Lung nodule EXAM: CT CHEST WITH CONTRAST TECHNIQUE: Multidetector CT imaging of the chest was performed during intravenous contrast administration. RADIATION DOSE REDUCTION: This exam was performed according to the departmental dose-optimization program which includes automated exposure control, adjustment of the mA and/or kV according to patient size and/or use of iterative reconstruction technique. CONTRAST:  67m OMNIPAQUE IOHEXOL 300 MG/ML  SOLN COMPARISON:  Previous studies including the chest radiographs done on 03/19/2022 FINDINGS: Cardiovascular: There is homogeneous enhancement in thoracic aorta. There is ectasia of the ascending thoracic aorta measuring 3.4 cm. There are no intraluminal filling defects seen central pulmonary artery branches. Heart is enlarged in size. Mediastinum/Nodes: No significant lymphadenopathy is seen. Lungs/Pleura: There are linear densities in medial segment of right middle lobe and lingula. There is 2 mm calcification in the medial segment of right middle lobe suggesting a granuloma. No focal pulmonary consolidation is seen. There are no discrete lung nodules. There is no pleural effusion or pneumothorax. Upper Abdomen: There is  fatty infiltration liver. Musculoskeletal: Hemangioma is seen in the body of T10 vertebra. IMPRESSION: There are linear densities in the lower lung fields, especially both cardiophrenic angles suggesting scarring or subsegmental atelectasis. There is no focal pulmonary consolidation. There are no discrete lung nodules. There is no significant lymphadenopathy. There is mild ectasia of ascending thoracic aorta measuring 3.4 cm. Fatty liver. Electronically Signed   By: PElmer PickerM.D.   On: 03/27/2022 13:53    Assessment & Plan:   BMasynwas seen today for gastroesophageal reflux and hypothyroidism.  Diagnoses and all orders for this visit:  Laryngitis, chronic- She will continue taking the PPI in the event that this is LPR.  I recommended she see an ENT to consider undergoing laryngoscopy. -     Ambulatory referral to ENT  Age-related osteoporosis without current pathological fracture -     denosumab (PROLIA) injection 60 mg   I am having Peterson Ao maintain her Flutter, multivitamin with minerals, Propylene Glycol (SYSTANE BALANCE OP), Omega-3 Fatty Acids (FISH OIL CONCENTRATE PO), albuterol, Calcium Carb-Cholecalciferol (CALCIUM/VITAMIN D PO), B Complex-C (SUPER B COMPLEX PO), Triamcinolone Acetonide (NASACORT ALLERGY 24HR NA), VITAMIN D PO, Fexofenadine HCl (MUCINEX ALLERGY PO), calcium carbonate, ketoconazole, mometasone, budesonide-formoterol, levothyroxine, and omeprazole. We administered denosumab.  Meds ordered this encounter  Medications   denosumab (PROLIA) injection 60 mg    Order Specific Question:   Patient is enrolled in [employer] program for this medication and I have provided a copy of the Prolia Medication Guide and Patient Brochure.    Answer:   Yes    Order Specific Question:   I have reviewed with the patient the information in the Prolia Medication Guide and Patient Counseling Chart including the serious risks of Prolia and symptoms of each risk.    Answer:   Yes     Order Specific Question:   I have advised the patient to seek medical attention if they have signs or symptoms of any of the serious risks.    Answer:   Yes     Follow-up: Return in about 3 months (around 11/07/2022).  Scarlette Calico, MD

## 2022-08-07 NOTE — Patient Instructions (Signed)
Dates of last mammogram and flu/covid/pneumonia vaccines   Laryngitis  Laryngitis is inflammation of the vocal cords that causes symptoms such as hoarseness or loss of voice. The vocal cords are two bands of muscles in your throat. When you speak, these cords come together and vibrate. The vibrations come out through your mouth as sound. When your vocal cords are inflamed, your voice sounds different. Laryngitis can be temporary (acute) or long-term (chronic). Most cases of acute laryngitis improve with time. Chronic laryngitis is laryngitis that lasts for more than 3 weeks. What are the causes? Acute laryngitis may be caused by: A viral infection. Lots of talking, yelling, or singing. This is also called vocal strain. A bacterial infection. Chronic laryngitis may be caused by: Vocal strain or an injury to the vocal cords. Acid reflux (gastroesophageal reflux disease, or GERD). Allergies, a sinus infection, or postnasal drip. Smoking. Excessive alcohol use. Breathing in chemicals or dust. Growths on the vocal cords. What increases the risk? The following factors may make you more likely to develop this condition: Smoking. Alcohol abuse. Having allergies. Chronic irritants in the workplace, such as toxic fumes. What are the signs or symptoms? Symptoms of this condition may include: Low, hoarse voice. Loss of voice. Dry cough. Sore or dry throat. Stuffy or congested nose. How is this diagnosed? This condition may be diagnosed based on: Your symptoms and a physical exam. Throat culture. Blood test. A procedure in which your health care provider looks at your vocal cords with a mirror or viewing tube (laryngoscopy). How is this treated? Treatment for laryngitis depends on what is causing it. Usually, treatment involves resting your voice and using medicines to soothe your throat. If your laryngitis is caused by a bacterial infection, you may need to take antibiotic medicine. If  your laryngitis is caused by a growth, you may need to have a procedure to remove it. Follow these instructions at home: Medicines Take over-the-counter and prescription medicines only as told by your health care provider. If you were prescribed an antibiotic medicine, take it as told by your health care provider. Do not stop taking the antibiotic even if you start to feel better. Use throat lozenges or sprays to soothe your throat as told by your health care provider. General instructions  Talk as little as possible. To do this: Write instead of talking. Do this until your voice is back to normal. Avoid whispering, which can cause vocal strain. Gargle with a mixture of salt and water 3-4 times a day or as needed. To make salt water, completely dissolve -1 tsp (3-6 g) of salt in 1 cup (237 mL) of warm water. Drink enough fluid to keep your urine pale yellow. Breathe in moist air. Use a humidifier if you live in a dry climate. Do not use any products that contain nicotine or tobacco. These products include cigarettes, chewing tobacco, and vaping devices, such as e-cigarettes. If you need help quitting, ask your health care provider. Contact a health care provider if: You have a fever. You have increasing pain. Your symptoms do not get better in 2 weeks. Get help right away if: You cough up blood. You have difficulty swallowing. You have trouble breathing. Summary Laryngitis is inflammation of the vocal cords that causes symptoms such as hoarseness or loss of voice. Laryngitis can be temporary or long-term. Treatment for laryngitis depends on the cause. It often involves resting your voice and using medicine to soothe your throat. Get help right away if you  have difficulty swallowing or breathing or if you cough up blood. This information is not intended to replace advice given to you by your health care provider. Make sure you discuss any questions you have with your health care  provider. Document Revised: 11/13/2020 Document Reviewed: 11/13/2020 Elsevier Patient Education  [place].

## 2022-08-21 ENCOUNTER — Ambulatory Visit: Payer: Medicare HMO | Admitting: Internal Medicine

## 2022-09-17 ENCOUNTER — Other Ambulatory Visit: Payer: Self-pay | Admitting: Internal Medicine

## 2022-09-17 ENCOUNTER — Ambulatory Visit (INDEPENDENT_AMBULATORY_CARE_PROVIDER_SITE_OTHER): Payer: Medicare HMO

## 2022-09-17 VITALS — Ht 63.0 in | Wt 158.0 lb

## 2022-09-17 DIAGNOSIS — J4540 Moderate persistent asthma, uncomplicated: Secondary | ICD-10-CM

## 2022-09-17 DIAGNOSIS — Z0000 Encounter for general adult medical examination without abnormal findings: Secondary | ICD-10-CM | POA: Diagnosis not present

## 2022-09-17 NOTE — Progress Notes (Signed)
Virtual Visit via Telephone Note  I connected with  Peterson Ao on 09/17/22 at  8:45 AM EST by telephone and verified that I am speaking with the correct person using two identifiers.  Location: Patient: Home Provider: [place] Persons participating in the virtual visit: [place]   I discussed the limitations, risks, security and privacy concerns of performing an evaluation and management service by telephone and the availability of in person appointments. The patient expressed understanding and agreed to proceed.  Interactive audio and video telecommunications were attempted between this nurse and patient, however failed, due to patient having technical difficulties OR patient did not have access to video capability.  We continued and completed visit with audio only.  Some vital signs may be absent or patient reported.   Sheral Flow, LPN  Subjective:   Mairi Stagliano is a 72 y.o. female who presents for Medicare Annual (Subsequent) preventive examination.  Review of Systems     Cardiac Risk Factors include: advanced age (>61mn, >>21women);dyslipidemia     Objective:    Today's Vitals   09/17/22 0848  Weight: 158 lb (71.7 kg)  Height: '5\' 3"'$  (1.6 m)  PainSc: 0-No pain   Body mass index is 27.99 kg/m.     09/17/2022    8:51 AM 11/01/2019    8:32 AM 07/29/2019   11:27 AM 07/22/2018   11:39 AM 11/08/2016    2:24 PM 03/11/2016   11:54 AM  Advanced Directives  Does Patient Have a Medical Advance Directive? Yes Yes Yes Yes Yes Yes  Type of [place]amed[place]of [place] will [place]Living will [place]Living will [place]Living will Living will [place]Living will  Does patient want to make changes to medical advance directive?     No - Patient declined No - Patient declined  Copy of HMinfordin Chart? No - copy  requested No - copy requested No - copy requested No - copy requested  No - copy requested  Would patient like information on creating a medical advance directive?     No - Patient declined     Current Medications (verified) Outpatient Encounter Medications as of 09/17/2022  Medication Sig   albuterol (PROAIR HFA) 108 (90 Base) MCG/ACT inhaler Inhale 2 puffs into the lungs every 6 (six) hours as needed for wheezing or shortness of breath.   B Complex-C (SUPER B COMPLEX PO) Take 1 tablet by mouth daily.   Calcium Carb-Cholecalciferol (CALCIUM/VITAMIN D PO) Take 1,200 mg by mouth daily.    calcium carbonate (OSCAL) 1500 (600 Ca) MG TABS tablet Take by mouth.   Fexofenadine HCl (MUCINEX ALLERGY PO) Take by mouth as needed.   ketoconazole (NIZORAL) 2 % cream ketoconazole 2 % topical cream  APPLY TO THE AFFECTED AREA(S) BY TOPICAL ROUTE ONCE DAILY   levothyroxine (SYNTHROID) 75 MCG tablet Take 1 tablet (75 mcg total) by mouth every other day.   mometasone (ELOCON) 0.1 % cream Apply topically.   Multiple Vitamin (MULTIVITAMIN WITH MINERALS) TABS tablet Take 1 tablet by mouth daily. Centrum Silver   Omega-3 Fatty Acids (FISH OIL CONCENTRATE PO) Take 1,200 mg by mouth daily.    omeprazole (PRILOSEC) 40 MG capsule Take 1 capsule (40 mg total) by mouth daily.   Propylene Glycol (SYSTANE BALANCE OP) Place 1 drop into both eyes daily.    Respiratory Therapy Supplies (FLUTTER) DEVI Use as directed   SYMBICORT 160-4.5 MCG/ACT  inhaler INHALE 2 PUFFS INTO THE LUNGS TWICE DAILY   Triamcinolone Acetonide (NASACORT ALLERGY 24HR NA) Place into the nose as needed.   VITAMIN D PO Take by mouth.   No facility-administered encounter medications on file as of 09/17/2022.    Allergies (verified) Ace inhibitors, Penicillins, Prednisone, Sulfa antibiotics, Sulfonamide derivatives, and Tetracycline   History: Past Medical History:  Diagnosis Date   Allergy    Anemia    as a child   Anxiety    Asthma     Dermatitis herpetiformis    GERD (gastroesophageal reflux disease)    HTN (hypertension)    Hypothyroidism    Osteoporosis    Past Surgical History:  Procedure Laterality Date   APPENDECTOMY     BIOPSY  11/01/2019   Procedure: BIOPSY;  Surgeon: Doran Stabler, MD;  Location: [facility];  Service: Gastroenterology;;   [facility] STUDY N/A 11/01/2019   Procedure: Franki [facility] [place];  Surgeon: Doran Stabler, MD;  Location: [facility];  Service: Gastroenterology;  Laterality: N/A;   COLONOSCOPY     CYSTECTOMY     ESOPHAGOGASTRODUODENOSCOPY (EGD) WITH PROPOFOL N/A 11/01/2019   Procedure: ESOPHAGOGASTRODUODENOSCOPY (EGD) WITH PROPOFOL;  Surgeon: Doran Stabler, MD;  Location: [facility];  Service: Gastroenterology;  Laterality: N/A;   ORIF WRIST FRACTURE Left 11/08/2016   Procedure: OPEN REDUCTION INTERNAL FIXATION (ORIF) LEFT WRIST FRACTURE;  Surgeon: Roseanne Kaufman, MD;  Location: [facility];  Service: Orthopedics;  Laterality: Left;   Family History  Problem Relation Age of Onset   Multiple sclerosis Mother    Lung cancer Mother    Colon polyps Mother    Alcohol abuse Father    Multiple sclerosis Sister    Dementia Sister    Multiple sclerosis Paternal Aunt    Other Grandchild        special needs   Colon cancer Neg Hx    Esophageal cancer Neg Hx    Rectal cancer Neg Hx    Stomach cancer Neg Hx    Crohn's disease Neg Hx    Ulcerative colitis Neg Hx    Social History   Socioeconomic History   Marital status: Married    Spouse name: Not on file   Number of children: 1   Years of education: Not on file   Highest education level: Not on file  Occupational History   Occupation: retired  Tobacco Use   Smoking status: Never    Passive exposure: Past (PARENTS SMOKED)   Smokeless tobacco: Never   Tobacco comments:    pt states both parents were smokers  Vaping Use   Vaping Use: Never used  Substance and Sexual Activity   Alcohol use: Yes    Comment: rare   Drug use:  No   Sexual activity: Not Currently  Other Topics Concern   Not on file  Social History Narrative   Not on file   Social Determinants of Health   Financial Resource Strain: Low Risk  (09/17/2022)   Overall Financial Resource Strain (CARDIA)    Difficulty of Paying Living Expenses: Not hard at all  Food Insecurity: No Food Insecurity (09/17/2022)   Hunger Vital Sign    Worried About Running Out of Food in the Last Year: Never true    Ran Out of Food in the Last Year: Never true  Transportation Needs: No Transportation Needs (09/17/2022)   PRAPARE - Hydrologist (Medical): No    Lack of Transportation (  Non-Medical): No  Physical Activity: Sufficiently Active (09/17/2022)   Exercise Vital Sign    Days of Exercise per Week: 4 days    Minutes of Exercise per Session: 50 min  Stress: No Stress Concern Present (09/17/2022)   [place]    Feeling of Stress : Not at all  Social Connections: [place] (09/17/2022)   Social Connection and Isolation Panel [NHANES]    Frequency of Communication with Friends and Family: More than three times a week    Frequency of Social Gatherings with Friends and Family: More than three times a week    Attends Religious Services: More than 4 times per year    Active Member of [employer] or Organizations: Yes    Attends Music therapist: More than 4 times per year    Marital Status: Married    Tobacco Counseling Counseling given: Not Answered Tobacco comments: pt states both parents were smokers   Clinical Intake:  Pre-visit preparation completed: Yes  Pain : No/denies pain Pain Score: 0-No pain     BMI - recorded: 27.99 Nutritional Risks: None Diabetes: No  How often do you need to have someone help you when you read instructions, pamphlets, or other written materials from your doctor or pharmacy?: 1 - Never What is the last grade level you  completed in school?: 2 years of college  Diabetic? no  Interpreter Needed?: No  Information entered by :: Lisette Abu, LPN.   Activities of Daily Living    09/17/2022    8:55 AM  In your present state of health, do you have any difficulty performing the following activities:  Hearing? 0  Vision? 0  Difficulty concentrating or making decisions? 0  Walking or climbing stairs? 0  Dressing or bathing? 0  Doing errands, shopping? 0  Preparing Food and eating ? N  Using the Toilet? N  In the past six months, have you accidently leaked urine? N  Do you have problems with loss of bowel control? N  Managing your Medications? N  Managing your Finances? N  Housekeeping or managing your Housekeeping? N    Patient Care Team: Janith Lima, MD as PCP - General (Internal Medicine) Arvella Nigh, MD as Consulting Physician (Obstetrics and Gynecology)  Indicate any recent Medical Services you may have received from other than [facility] providers in the past year (date may be approximate).     Assessment:   This is a routine wellness examination for [place].  Hearing/Vision screen Hearing Screening - Comments:: Denies hearing difficulties.   Vision Screening - Comments:: Wears rx glasses - up to date with routine eye exams with Luberta Mutter, MD   Dietary issues and exercise activities discussed: Current Exercise Habits: Home exercise routine, Type of exercise: walking, Time (Minutes): 60, Frequency (Times/Week): 4, Weekly Exercise (Minutes/Week): 240, Intensity: Moderate, Exercise limited by: respiratory conditions(s)   Goals Addressed   None   Depression Screen    09/17/2022    8:51 AM 08/07/2022    1:47 PM 01/11/2021   10:09 AM 07/29/2019   11:30 AM 07/07/2019    3:54 PM 07/22/2018   10:56 AM 07/18/2017   11:16 AM  PHQ 2/9 Scores  PHQ - 2 Score 0 0 0 1 0 1 0  PHQ- 9 Score 0 0  '3 2 2     '$ Fall Risk    09/17/2022    8:55 AM 03/19/2022   11:04 AM 01/11/2021   10:10 AM  07/29/2019   11:30 AM 07/07/2019    3:53 PM  Fall Risk   Falls in the past year? 0 1 0 0 0  Number falls in past yr: 0 0 0 0 0  Injury with Fall? 0 1 0 0 0  Risk for fall due to : No Fall Risks No Fall Risks     Follow up Falls prevention discussed Falls evaluation completed  Falls prevention discussed Falls evaluation completed    [place]:  Any stairs in or around the home? Yes  If so, are there any without handrails? No  Home free of loose throw rugs in walkways, pet beds, electrical cords, etc? Yes  Adequate lighting in your home to reduce risk of falls? Yes   ASSISTIVE DEVICES UTILIZED TO PREVENT FALLS:  Life alert? No  Use of a cane, walker or w/c? No  Grab bars in the bathroom? No  Shower chair or bench in shower? No  Elevated toilet seat or a handicapped toilet? No   TIMED UP AND GO: Phone Visit  Was the test performed? No .   Cognitive Function:        09/17/2022    8:55 AM  6CIT Screen  What Year? 0 points  What month? 0 points  What time? 0 points  Count back from 20 0 points  Months in reverse 0 points  Repeat phrase 0 points  Total Score 0 points    Immunizations Immunization History  Administered Date(s) Administered   COVID-19, mRNA, vaccine(Comirnaty)12 years and older 06/01/2022   Fluad Quad(high Dose 65+) 06/14/2019   Influenza Inj Mdck Quad Pf 07/04/2017   Influenza Split 07/10/2012   Influenza Whole 08/31/2010, 05/20/2011   Influenza, High Dose Seasonal PF 06/09/2018, 06/01/2022   Influenza,inj,Quad PF,6+ Mos 07/22/2013, 08/25/2014   Influenza-Unspecified 05/10/2021, 05/31/2022   Moderna SARS-COV2 Booster Vaccination 12/08/2020   PFIZER(Purple Top)SARS-COV-2 Vaccination 10/14/2019, 11/04/2019, 06/08/2020, 05/28/2021   Pneumococcal Conjugate-13 03/07/2016   Pneumococcal Polysaccharide-23 07/16/2017   Td 08/31/2010   Tdap 01/11/2021   Zoster Recombinat (Shingrix) 01/11/2021, 03/27/2021   Zoster, Live  11/01/2011    TDAP status: Up to date  Flu Vaccine status: Up to date  Pneumococcal vaccine status: Up to date  Covid-19 vaccine status: Completed vaccines  Qualifies for Shingles Vaccine? Yes   Zostavax completed Yes   Shingrix Completed?: Yes  Screening Tests Health Maintenance  Topic Date Due   Medicare Annual Wellness (AWV)  09/18/2023   MAMMOGRAM  04/01/2024   COLONOSCOPY (Pts 45-75yr Insurance coverage will need to be confirmed)  07/08/2029   DTaP/Tdap/Td (3 - Td or Tdap) 01/12/2031   Pneumonia Vaccine 72 Years old  Completed   INFLUENZA VACCINE  Completed   DEXA SCAN  Completed   COVID-19 Vaccine  Completed   Hepatitis C Screening  Completed   Zoster Vaccines- Shingrix  Completed   HPV VACCINES  Aged Out    Health Maintenance  There are no preventive care reminders to display for this patient.   Colorectal cancer screening: Type of screening: Colonoscopy. Completed 07/08/2022. Repeat every 7 years  Mammogram status: Completed 04/01/2022. Repeat every year  Bone Density status: Completed 01/15/2021. Results reflect: Bone density results: OSTEOPOROSIS. Repeat every 2 years.  Lung Cancer Screening: (Low Dose CT Chest recommended if Age 72-80years, 30 pack-year currently smoking OR have quit w/in 15years.) does not qualify.   Lung Cancer Screening Referral: no  Additional Screening:  Hepatitis C Screening: does qualify; Completed 03/07/2016  Vision Screening: Recommended annual ophthalmology exams for early detection of glaucoma and other disorders of the eye. Is the patient up to date with their annual eye exam?  Yes  Who is the provider or what is the name of the office in which the patient attends annual eye exams? Luberta Mutter, MD. If pt is not established with a provider, would they like to be referred to a provider to establish care? No .   Dental Screening: Recommended annual dental exams for proper oral hygiene  Community Resource Referral /  Chronic Care Management: CRR required this visit?  No   CCM required this visit?  No      Plan:     I have personally reviewed and noted the following in the patient's chart:   Medical and social history Use of alcohol, tobacco or illicit drugs  Current medications and supplements including opioid prescriptions. Patient is not currently taking opioid prescriptions. Functional ability and status Nutritional status Physical activity Advanced directives List of other physicians Hospitalizations, surgeries, and ER visits in previous 12 months Vitals Screenings to include cognitive, depression, and falls Referrals and appointments  In addition, I have reviewed and discussed with patient certain preventive protocols, quality metrics, and best practice recommendations. A written personalized care plan for preventive services as well as general preventive health recommendations were provided to patient.     Sheral Flow, LPN   03/10/5365   Nurse Notes: N/A

## 2022-09-17 NOTE — Patient Instructions (Signed)
Ms. Tracey Boyd , Thank you for taking time to come for your Medicare Wellness Visit. I appreciate your ongoing commitment to your health goals. Please review the following plan we discussed and let me know if I can assist you in the future.   These are the goals we discussed:  Goals      Patient Stated     Continue to exercise and eat healthy. I would like to start to do yoga to maintain strength in my core and use weights to strengthen my arms.         This is a list of the screening recommended for you and due dates:  Health Maintenance  Topic Date Due   Medicare Annual Wellness Visit  09/18/2023   Mammogram  04/01/2024   Colon Cancer Screening  07/08/2029   DTaP/Tdap/Td vaccine (3 - Td or Tdap) 01/12/2031   Pneumonia Vaccine  Completed   Flu Shot  Completed   DEXA scan (bone density measurement)  Completed   COVID-19 Vaccine  Completed   Hepatitis C Screening: USPSTF Recommendation to screen - Ages 16-79 yo.  Completed   Zoster (Shingles) Vaccine  Completed   HPV Vaccine  Aged Out    Advanced directives: Yes  Conditions/risks identified: Yes  Next appointment: Follow up in one year for your annual wellness visit.   Preventive Care 28 Years and Older, Female Preventive care refers to lifestyle choices and visits with your health care provider that can promote health and wellness. What does preventive care include? A yearly physical exam. This is also called an annual well check. Dental exams once or twice a year. Routine eye exams. Ask your health care provider how often you should have your eyes checked. Personal lifestyle choices, including: Daily care of your teeth and gums. Regular physical activity. Eating a healthy diet. Avoiding tobacco and drug use. Limiting alcohol use. Practicing safe sex. Taking low-dose aspirin every day. Taking vitamin and mineral supplements as recommended by your health care provider. What happens during an annual well check? The  services and screenings done by your health care provider during your annual well check will depend on your age, overall health, lifestyle risk factors, and family history of disease. Counseling  Your health care provider may ask you questions about your: Alcohol use. Tobacco use. Drug use. Emotional well-being. Home and relationship well-being. Sexual activity. Eating habits. History of falls. Memory and ability to understand (cognition). Work and work Statistician. Reproductive health. Screening  You may have the following tests or measurements: Height, weight, and BMI. Blood pressure. Lipid and cholesterol levels. These may be checked every 5 years, or more frequently if you are over 76 years old. Skin check. Lung cancer screening. You may have this screening every year starting at age 27 if you have a 30-pack-year history of smoking and currently smoke or have quit within the past 15 years. Fecal occult blood test (FOBT) of the stool. You may have this test every year starting at age 17. Flexible sigmoidoscopy or colonoscopy. You may have a sigmoidoscopy every 5 years or a colonoscopy every 10 years starting at age 26. Hepatitis C blood test. Hepatitis B blood test. Sexually transmitted disease (STD) testing. Diabetes screening. This is done by checking your blood sugar (glucose) after you have not eaten for a while (fasting). You may have this done every 1-3 years. Bone density scan. This is done to screen for osteoporosis. You may have this done starting at age 79. Mammogram. This may be  done every 1-2 years. Talk to your health care provider about how often you should have regular mammograms. Talk with your health care provider about your test results, treatment options, and if necessary, the need for more tests. Vaccines  Your health care provider may recommend certain vaccines, such as: Influenza vaccine. This is recommended every year. Tetanus, diphtheria, and acellular  pertussis (Tdap, Td) vaccine. You may need a Td booster every 10 years. Zoster vaccine. You may need this after age 17. Pneumococcal 13-valent conjugate (PCV13) vaccine. One dose is recommended after age 59. Pneumococcal polysaccharide (PPSV23) vaccine. One dose is recommended after age 70. Talk to your health care provider about which screenings and vaccines you need and how often you need them. This information is not intended to replace advice given to you by your health care provider. Make sure you discuss any questions you have with your health care provider. Document Released: 09/22/2015 Document Revised: 05/15/2016 Document Reviewed: 06/27/2015 Elsevier Interactive Patient Education  2017 [place] Prevention in the Home Falls can cause injuries. They can happen to people of all ages. There are many things you can do to make your home safe and to help prevent falls. What can I do on the outside of my home? Regularly fix the edges of walkways and driveways and fix any cracks. Remove anything that might make you trip as you walk through a door, such as a raised step or threshold. Trim any bushes or trees on the path to your home. Use bright outdoor lighting. Clear any walking paths of anything that might make someone trip, such as rocks or tools. Regularly check to see if handrails are loose or broken. Make sure that both sides of any steps have handrails. Any raised decks and porches should have guardrails on the edges. Have any leaves, snow, or ice cleared regularly. Use sand or salt on walking paths during winter. Clean up any spills in your garage right away. This includes oil or grease spills. What can I do in the bathroom? Use night lights. Install grab bars by the toilet and in the tub and shower. Do not use towel bars as grab bars. Use non-skid mats or decals in the tub or shower. If you need to sit down in the shower, use a plastic, non-slip stool. Keep the floor  dry. Clean up any water that spills on the floor as soon as it happens. Remove soap buildup in the tub or shower regularly. Attach bath mats securely with double-sided non-slip rug tape. Do not have throw rugs and other things on the floor that can make you trip. What can I do in the bedroom? Use night lights. Make sure that you have a light by your bed that is easy to reach. Do not use any sheets or blankets that are too big for your bed. They should not hang down onto the floor. Have a firm chair that has side arms. You can use this for support while you get dressed. Do not have throw rugs and other things on the floor that can make you trip. What can I do in the kitchen? Clean up any spills right away. Avoid walking on wet floors. Keep items that you use a lot in easy-to-reach places. If you need to reach something above you, use a strong step stool that has a grab bar. Keep electrical cords out of the way. Do not use floor polish or wax that makes floors slippery. If you must use wax,  use non-skid floor wax. Do not have throw rugs and other things on the floor that can make you trip. What can I do with my stairs? Do not leave any items on the stairs. Make sure that there are handrails on both sides of the stairs and use them. Fix handrails that are broken or loose. Make sure that handrails are as long as the stairways. Check any carpeting to make sure that it is firmly attached to the stairs. Fix any carpet that is loose or worn. Avoid having throw rugs at the top or bottom of the stairs. If you do have throw rugs, attach them to the floor with carpet tape. Make sure that you have a light switch at the top of the stairs and the bottom of the stairs. If you do not have them, ask someone to add them for you. What else can I do to help prevent falls? Wear shoes that: Do not have high heels. Have rubber bottoms. Are comfortable and fit you well. Are closed at the toe. Do not wear  sandals. If you use a stepladder: Make sure that it is fully opened. Do not climb a closed stepladder. Make sure that both sides of the stepladder are locked into place. Ask someone to hold it for you, if possible. Clearly mark and make sure that you can see: Any grab bars or handrails. First and last steps. Where the edge of each step is. Use tools that help you move around (mobility aids) if they are needed. These include: Canes. Walkers. Scooters. Crutches. Turn on the lights when you go into a dark area. Replace any light bulbs as soon as they burn out. Set up your furniture so you have a clear path. Avoid moving your furniture around. If any of your floors are uneven, fix them. If there are any pets around you, be aware of where they are. Review your medicines with your doctor. Some medicines can make you feel dizzy. This can increase your chance of falling. Ask your doctor what other things that you can do to help prevent falls. This information is not intended to replace advice given to you by your health care provider. Make sure you discuss any questions you have with your health care provider. Document Released: 06/22/2009 Document Revised: 02/01/2016 Document Reviewed: 09/30/2014 Elsevier Interactive Patient Education  2017 [employer].

## 2022-10-21 DIAGNOSIS — L718 Other rosacea: Secondary | ICD-10-CM | POA: Diagnosis not present

## 2022-10-21 DIAGNOSIS — L821 Other seborrheic keratosis: Secondary | ICD-10-CM | POA: Diagnosis not present

## 2022-11-12 DIAGNOSIS — H903 Sensorineural hearing loss, bilateral: Secondary | ICD-10-CM | POA: Diagnosis not present

## 2022-11-12 DIAGNOSIS — R490 Dysphonia: Secondary | ICD-10-CM | POA: Diagnosis not present

## 2022-11-12 DIAGNOSIS — H938X3 Other specified disorders of ear, bilateral: Secondary | ICD-10-CM | POA: Diagnosis not present

## 2023-01-21 ENCOUNTER — Other Ambulatory Visit: Payer: Self-pay | Admitting: Internal Medicine

## 2023-01-21 DIAGNOSIS — K219 Gastro-esophageal reflux disease without esophagitis: Secondary | ICD-10-CM

## 2023-01-21 DIAGNOSIS — E039 Hypothyroidism, unspecified: Secondary | ICD-10-CM

## 2023-01-30 ENCOUNTER — Encounter: Payer: Self-pay | Admitting: Internal Medicine

## 2023-01-30 ENCOUNTER — Ambulatory Visit (INDEPENDENT_AMBULATORY_CARE_PROVIDER_SITE_OTHER): Payer: Medicare HMO | Admitting: Internal Medicine

## 2023-01-30 VITALS — BP 126/76 | HR 72 | Temp 97.8°F | Ht 63.0 in

## 2023-01-30 DIAGNOSIS — M810 Age-related osteoporosis without current pathological fracture: Secondary | ICD-10-CM | POA: Diagnosis not present

## 2023-01-30 DIAGNOSIS — E785 Hyperlipidemia, unspecified: Secondary | ICD-10-CM | POA: Diagnosis not present

## 2023-01-30 DIAGNOSIS — E2839 Other primary ovarian failure: Secondary | ICD-10-CM

## 2023-01-30 DIAGNOSIS — Z1159 Encounter for screening for other viral diseases: Secondary | ICD-10-CM | POA: Diagnosis not present

## 2023-01-30 DIAGNOSIS — K219 Gastro-esophageal reflux disease without esophagitis: Secondary | ICD-10-CM | POA: Diagnosis not present

## 2023-01-30 DIAGNOSIS — E039 Hypothyroidism, unspecified: Secondary | ICD-10-CM | POA: Diagnosis not present

## 2023-01-30 LAB — CBC WITH DIFFERENTIAL/PLATELET
Basophils Absolute: 0.1 10*3/uL (ref 0.0–0.1)
Basophils Relative: 1.1 % (ref 0.0–3.0)
Eosinophils Absolute: 0.4 10*3/uL (ref 0.0–0.7)
Eosinophils Relative: 7.2 % — ABNORMAL HIGH (ref 0.0–5.0)
HCT: 42.3 % (ref 36.0–46.0)
Hemoglobin: 13.8 g/dL (ref 12.0–15.0)
Lymphocytes Relative: 31.8 % (ref 12.0–46.0)
Lymphs Abs: 1.7 10*3/uL (ref 0.7–4.0)
MCHC: 32.8 g/dL (ref 30.0–36.0)
MCV: 91 fl (ref 78.0–100.0)
Monocytes Absolute: 0.5 10*3/uL (ref 0.1–1.0)
Monocytes Relative: 9.9 % (ref 3.0–12.0)
Neutro Abs: 2.7 10*3/uL (ref 1.4–7.7)
Neutrophils Relative %: 50 % (ref 43.0–77.0)
Platelets: 234 10*3/uL (ref 150.0–400.0)
RBC: 4.64 Mil/uL (ref 3.87–5.11)
RDW: 13.8 % (ref 11.5–15.5)
WBC: 5.5 10*3/uL (ref 4.0–10.5)

## 2023-01-30 LAB — LIPID PANEL
Cholesterol: 209 mg/dL — ABNORMAL HIGH (ref 0–200)
HDL: 66.2 mg/dL (ref >39.00)
LDL Cholesterol: 127 mg/dL — ABNORMAL HIGH (ref 0–99)
NonHDL: 142.9
Total CHOL/HDL Ratio: 3
Triglycerides: 79 mg/dL (ref 0.0–149.0)
VLDL: 15.8 mg/dL (ref 0.0–40.0)

## 2023-01-30 LAB — HEPATIC FUNCTION PANEL
ALT: 19 U/L (ref 0–35)
AST: 26 U/L (ref 0–37)
Albumin: 4.4 g/dL (ref 3.5–5.2)
Alkaline Phosphatase: 50 U/L (ref 39–117)
Bilirubin, Direct: 0.1 mg/dL (ref 0.0–0.3)
Total Bilirubin: 0.5 mg/dL (ref 0.2–1.2)
Total Protein: 7.6 g/dL (ref 6.0–8.3)

## 2023-01-30 LAB — TSH: TSH: 2.6 u[IU]/mL (ref 0.35–5.50)

## 2023-01-30 LAB — BASIC METABOLIC PANEL
BUN: 24 mg/dL — ABNORMAL HIGH (ref 6–23)
CO2: 29 mEq/L (ref 19–32)
Calcium: 9.4 mg/dL (ref 8.4–10.5)
Chloride: 107 mEq/L (ref 96–112)
Creatinine, Ser: 0.92 mg/dL (ref 0.40–1.20)
GFR: 62.47 mL/min (ref >60.00)
Glucose, Bld: 84 mg/dL (ref 70–99)
Potassium: 4 mEq/L (ref 3.5–5.1)
Sodium: 143 mEq/L (ref 135–145)

## 2023-01-30 LAB — PHOSPHORUS: Phosphorus: 2.7 mg/dL (ref 2.3–4.6)

## 2023-01-30 LAB — VITAMIN D 25 HYDROXY (VIT D DEFICIENCY, FRACTURES): VITD: 55.24 ng/mL (ref 30.00–100.00)

## 2023-01-30 MED ORDER — OMEPRAZOLE 40 MG PO CPDR: 40.0000 mg | DELAYED_RELEASE_CAPSULE | Freq: Every day | ORAL | 1 refills | Status: DC

## 2023-01-30 MED ORDER — LEVOTHYROXINE SODIUM 75 MCG PO TABS: 75.0000 ug | ORAL_TABLET | ORAL | 1 refills | Status: DC

## 2023-01-30 NOTE — Patient Instructions (Signed)

## 2023-01-30 NOTE — Progress Notes (Signed)
Subjective:  Patient ID: Tracey Boyd, female    DOB: 1951/05/14  Age: 72 y.o. MRN: [id number]  CC: Hypothyroidism, Hyperlipidemia, and Gastroesophageal Reflux   HPI Ranique Boyd presents for f/up ---  She walks about 1-1/2 miles a day.  She has good endurance.  She denies chest pain, shortness of breath, diaphoresis, or edema.  Outpatient Medications Prior to Visit  Medication Sig Dispense Refill   albuterol (PROAIR HFA) 108 (90 Base) MCG/ACT inhaler Inhale 2 puffs into the lungs every 6 (six) hours as needed for wheezing or shortness of breath. 18 g 3   B Complex-C (SUPER B COMPLEX PO) Take 1 tablet by mouth daily.     Calcium Carb-Cholecalciferol (CALCIUM/VITAMIN D PO) Take 1,200 mg by mouth daily.      calcium carbonate (OSCAL) 1500 (600 Ca) MG TABS tablet Take by mouth.     Fexofenadine HCl (MUCINEX ALLERGY PO) Take by mouth as needed.     ketoconazole (NIZORAL) 2 % cream ketoconazole 2 % topical cream  APPLY TO THE AFFECTED AREA(S) BY TOPICAL ROUTE ONCE DAILY     mometasone (ELOCON) 0.1 % cream Apply topically.     Multiple Vitamin (MULTIVITAMIN WITH MINERALS) TABS tablet Take 1 tablet by mouth daily. Centrum Silver     Omega-3 Fatty Acids (FISH OIL CONCENTRATE PO) Take 1,200 mg by mouth daily.      Propylene Glycol (SYSTANE BALANCE OP) Place 1 drop into both eyes daily.      Respiratory Therapy Supplies (FLUTTER) DEVI Use as directed 1 each 0   SYMBICORT 160-4.5 MCG/ACT inhaler INHALE 2 PUFFS INTO THE LUNGS TWICE DAILY 30.6 g 1   Triamcinolone Acetonide (NASACORT ALLERGY 24HR NA) Place into the nose as needed.     VITAMIN D PO Take by mouth.     levothyroxine (SYNTHROID) 75 MCG tablet Take 1 tablet (75 mcg total) by mouth every other day. 45 tablet 1   omeprazole (PRILOSEC) 40 MG capsule Take 1 capsule (40 mg total) by mouth daily. 90 capsule 0   No facility-administered medications prior to visit.    ROS Review of Systems  Constitutional: Negative.  Negative for  diaphoresis and fatigue.  HENT:  Positive for voice change. Negative for trouble swallowing.   Eyes: Negative.   Respiratory:  Negative for cough, chest tightness, shortness of breath and wheezing.   Cardiovascular:  Negative for chest pain, palpitations and leg swelling.  Gastrointestinal:  Negative for abdominal pain, constipation, diarrhea, nausea and vomiting.  Endocrine: Negative.  Negative for cold intolerance and heat intolerance.  Genitourinary: Negative.  Negative for difficulty urinating.  Musculoskeletal: Negative.   Skin: Negative.   Neurological:  Negative for dizziness, weakness, light-headedness and numbness.  Hematological:  Negative for adenopathy. Does not bruise/bleed easily.  Psychiatric/Behavioral: Negative.      Objective:  BP 126/76 (BP Location: Right Arm, Patient Position: Sitting, Cuff Size: Large)   Pulse 72   Temp 97.8 F (36.6 C) (Oral)   Ht 5\' 3"  (1.6 m)   SpO2 95%   BMI 27.99 kg/m   BP Readings from Last 3 Encounters:  01/30/23 126/76  08/07/22 126/88  07/08/22 114/71    Wt Readings from Last 3 Encounters:  09/17/22 158 lb (71.7 kg)  08/07/22 158 lb (71.7 kg)  07/08/22 157 lb (71.2 kg)    Physical Exam Vitals reviewed.  Constitutional:      Appearance: She is not ill-appearing.  HENT:     Mouth/Throat:     Mouth: Mucous  membranes are moist.  Eyes:     General: No scleral icterus.    Conjunctiva/sclera: Conjunctivae normal.  Cardiovascular:     Rate and Rhythm: Normal rate and regular rhythm.     Heart sounds: No murmur heard. Pulmonary:     Effort: Pulmonary effort is normal.     Breath sounds: No stridor. No wheezing, rhonchi or rales.  Abdominal:     Palpations: There is no mass.     Tenderness: There is no abdominal tenderness. There is no guarding.     Hernia: No hernia is present.  Musculoskeletal:        General: Normal range of motion.     Cervical back: Neck supple.     Right lower leg: No edema.     Left lower leg:  No edema.  Lymphadenopathy:     Cervical: No cervical adenopathy.  Skin:    General: Skin is warm and dry.     Coloration: Skin is not pale.     Findings: No rash.  Neurological:     General: No focal deficit present.     Mental Status: She is alert. Mental status is at baseline.  Psychiatric:        Mood and Affect: Mood normal.        Behavior: Behavior normal.     Lab Results  Component Value Date   WBC 5.5 01/30/2023   HGB 13.8 01/30/2023   HCT 42.3 01/30/2023   PLT 234.0 01/30/2023   GLUCOSE 84 01/30/2023   CHOL 209 (H) 01/30/2023   TRIG 79.0 01/30/2023   HDL 66.20 01/30/2023   LDLCALC 127 (H) 01/30/2023   ALT 19 01/30/2023   AST 26 01/30/2023   NA 143 01/30/2023   K 4.0 01/30/2023   CL 107 01/30/2023   CREATININE 0.92 01/30/2023   BUN 24 (H) 01/30/2023   CO2 29 01/30/2023   TSH 2.60 01/30/2023    CT Chest W Contrast  Result Date: 03/27/2022 CLINICAL DATA:  Lung nodule EXAM: CT CHEST WITH CONTRAST TECHNIQUE: Multidetector CT imaging of the chest was performed during intravenous contrast administration. RADIATION DOSE REDUCTION: This exam was performed according to the departmental dose-optimization program which includes automated exposure control, adjustment of the mA and/or kV according to patient size and/or use of iterative reconstruction technique. CONTRAST:  75mL OMNIPAQUE IOHEXOL 300 MG/ML  SOLN COMPARISON:  Previous studies including the chest radiographs done on 03/19/2022 FINDINGS: Cardiovascular: There is homogeneous enhancement in thoracic aorta. There is ectasia of the ascending thoracic aorta measuring 3.4 cm. There are no intraluminal filling defects seen central pulmonary artery branches. Heart is enlarged in size. Mediastinum/Nodes: No significant lymphadenopathy is seen. Lungs/Pleura: There are linear densities in medial segment of right middle lobe and lingula. There is 2 mm calcification in the medial segment of right middle lobe suggesting a  granuloma. No focal pulmonary consolidation is seen. There are no discrete lung nodules. There is no pleural effusion or pneumothorax. Upper Abdomen: There is fatty infiltration liver. Musculoskeletal: Hemangioma is seen in the body of T10 vertebra. IMPRESSION: There are linear densities in the lower lung fields, especially both cardiophrenic angles suggesting scarring or subsegmental atelectasis. There is no focal pulmonary consolidation. There are no discrete lung nodules. There is no significant lymphadenopathy. There is mild ectasia of ascending thoracic aorta measuring 3.4 cm. Fatty liver. Electronically Signed   By: Ernie Avena M.D.   On: 03/27/2022 13:53    Assessment & Plan:   Age-related osteoporosis without  current pathological fracture- She is due for a repeat DEXA scan. -     Basic metabolic panel; Future -     Phosphorus; Future -     VITAMIN D 25 Hydroxy (Vit-D Deficiency, Fractures); Future  Acquired hypothyroidism- She is euthyroid.  -     TSH; Future -     Levothyroxine Sodium; Take 1 tablet (75 mcg total) by mouth every other day.  Dispense: 45 tablet; Refill: 1  Laryngopharyngeal reflux- Will continue the PPI. -     CBC with Differential/Platelet; Future -     Omeprazole; Take 1 capsule (40 mg total) by mouth daily.  Dispense: 90 capsule; Refill: 1  Hyperlipidemia with target LDL less than 130- Statin is not indicated. -     Lipid panel; Future -     Hepatic function panel; Future  Need for hepatitis C screening test -     Hepatitis C antibody; Future     Follow-up: No follow-ups on file.  Sanda Linger, MD

## 2023-02-01 LAB — HEPATITIS C ANTIBODY: Hepatitis C Ab: NONREACTIVE

## 2023-02-25 DIAGNOSIS — J383 Other diseases of vocal cords: Secondary | ICD-10-CM | POA: Diagnosis not present

## 2023-02-25 DIAGNOSIS — R490 Dysphonia: Secondary | ICD-10-CM | POA: Diagnosis not present

## 2023-02-25 DIAGNOSIS — J3801 Paralysis of vocal cords and larynx, unilateral: Secondary | ICD-10-CM | POA: Diagnosis not present

## 2023-03-26 DIAGNOSIS — J387 Other diseases of larynx: Secondary | ICD-10-CM | POA: Diagnosis not present

## 2023-03-26 DIAGNOSIS — J383 Other diseases of vocal cords: Secondary | ICD-10-CM | POA: Diagnosis not present

## 2023-04-16 DIAGNOSIS — J383 Other diseases of vocal cords: Secondary | ICD-10-CM | POA: Diagnosis not present

## 2023-04-22 DIAGNOSIS — Z1231 Encounter for screening mammogram for malignant neoplasm of breast: Secondary | ICD-10-CM | POA: Diagnosis not present

## 2023-05-05 DIAGNOSIS — H5203 Hypermetropia, bilateral: Secondary | ICD-10-CM | POA: Diagnosis not present

## 2023-05-05 DIAGNOSIS — H2513 Age-related nuclear cataract, bilateral: Secondary | ICD-10-CM | POA: Diagnosis not present

## 2023-05-07 DIAGNOSIS — Z6827 Body mass index (BMI) 27.0-27.9, adult: Secondary | ICD-10-CM | POA: Diagnosis not present

## 2023-05-07 DIAGNOSIS — Z01419 Encounter for gynecological examination (general) (routine) without abnormal findings: Secondary | ICD-10-CM | POA: Diagnosis not present

## 2023-05-23 DIAGNOSIS — R490 Dysphonia: Secondary | ICD-10-CM | POA: Diagnosis not present

## 2023-05-27 DIAGNOSIS — R490 Dysphonia: Secondary | ICD-10-CM | POA: Diagnosis not present

## 2023-06-03 DIAGNOSIS — R490 Dysphonia: Secondary | ICD-10-CM | POA: Diagnosis not present

## 2023-06-04 DIAGNOSIS — L821 Other seborrheic keratosis: Secondary | ICD-10-CM | POA: Diagnosis not present

## 2023-06-04 DIAGNOSIS — L720 Epidermal cyst: Secondary | ICD-10-CM | POA: Diagnosis not present

## 2023-06-04 DIAGNOSIS — L820 Inflamed seborrheic keratosis: Secondary | ICD-10-CM | POA: Diagnosis not present

## 2023-06-04 DIAGNOSIS — D2272 Melanocytic nevi of left lower limb, including hip: Secondary | ICD-10-CM | POA: Diagnosis not present

## 2023-06-04 DIAGNOSIS — D2271 Melanocytic nevi of right lower limb, including hip: Secondary | ICD-10-CM | POA: Diagnosis not present

## 2023-06-04 DIAGNOSIS — L304 Erythema intertrigo: Secondary | ICD-10-CM | POA: Diagnosis not present

## 2023-06-04 DIAGNOSIS — L812 Freckles: Secondary | ICD-10-CM | POA: Diagnosis not present

## 2023-06-05 DIAGNOSIS — R498 Other voice and resonance disorders: Secondary | ICD-10-CM | POA: Diagnosis not present

## 2023-06-05 DIAGNOSIS — J383 Other diseases of vocal cords: Secondary | ICD-10-CM | POA: Diagnosis not present

## 2023-06-10 DIAGNOSIS — R490 Dysphonia: Secondary | ICD-10-CM | POA: Diagnosis not present

## 2023-07-22 ENCOUNTER — Ambulatory Visit (INDEPENDENT_AMBULATORY_CARE_PROVIDER_SITE_OTHER)
Admission: RE | Admit: 2023-07-22 | Discharge: 2023-07-22 | Disposition: A | Discharge status: Home / Self Care | Payer: Medicare HMO | Source: Ambulatory Visit | Attending: Internal Medicine | Admitting: Internal Medicine

## 2023-07-22 DIAGNOSIS — M810 Age-related osteoporosis without current pathological fracture: Secondary | ICD-10-CM | POA: Diagnosis not present

## 2023-07-22 DIAGNOSIS — E2839 Other primary ovarian failure: Secondary | ICD-10-CM

## 2023-07-29 DIAGNOSIS — R490 Dysphonia: Secondary | ICD-10-CM | POA: Diagnosis not present

## 2023-07-29 DIAGNOSIS — J383 Other diseases of vocal cords: Secondary | ICD-10-CM | POA: Diagnosis not present

## 2023-08-10 ENCOUNTER — Other Ambulatory Visit: Payer: Self-pay | Admitting: Internal Medicine

## 2023-08-10 DIAGNOSIS — K219 Gastro-esophageal reflux disease without esophagitis: Secondary | ICD-10-CM

## 2023-08-10 DIAGNOSIS — E039 Hypothyroidism, unspecified: Secondary | ICD-10-CM

## 2023-08-14 ENCOUNTER — Ambulatory Visit (INDEPENDENT_AMBULATORY_CARE_PROVIDER_SITE_OTHER): Payer: Medicare HMO | Admitting: Internal Medicine

## 2023-08-14 ENCOUNTER — Encounter: Payer: Self-pay | Admitting: Internal Medicine

## 2023-08-14 VITALS — BP 160/92 | HR 75 | Temp 97.5°F | Resp 16 | Ht 63.0 in | Wt 158.6 lb

## 2023-08-14 DIAGNOSIS — K219 Gastro-esophageal reflux disease without esophagitis: Secondary | ICD-10-CM

## 2023-08-14 DIAGNOSIS — E039 Hypothyroidism, unspecified: Secondary | ICD-10-CM | POA: Diagnosis not present

## 2023-08-14 DIAGNOSIS — M810 Age-related osteoporosis without current pathological fracture: Secondary | ICD-10-CM

## 2023-08-14 DIAGNOSIS — R233 Spontaneous ecchymoses: Secondary | ICD-10-CM | POA: Diagnosis not present

## 2023-08-14 DIAGNOSIS — E785 Hyperlipidemia, unspecified: Secondary | ICD-10-CM

## 2023-08-14 DIAGNOSIS — I10 Essential (primary) hypertension: Secondary | ICD-10-CM

## 2023-08-14 DIAGNOSIS — Z0001 Encounter for general adult medical examination with abnormal findings: Secondary | ICD-10-CM | POA: Diagnosis not present

## 2023-08-14 LAB — VITAMIN D 25 HYDROXY (VIT D DEFICIENCY, FRACTURES): VITD: 51.29 ng/mL (ref 30.00–100.00)

## 2023-08-14 LAB — URINALYSIS, ROUTINE W REFLEX MICROSCOPIC
Bilirubin Urine: NEGATIVE
Hgb urine dipstick: NEGATIVE
Ketones, ur: NEGATIVE
Nitrite: NEGATIVE
RBC / HPF: NONE SEEN (ref 0–?)
Specific Gravity, Urine: <=1.005 — AB (ref 1.000–1.030)
Total Protein, Urine: NEGATIVE
Urine Glucose: NEGATIVE
Urobilinogen, UA: 0.2 (ref 0.0–1.0)
pH: 6 (ref 5.0–8.0)

## 2023-08-14 LAB — CBC WITH DIFFERENTIAL/PLATELET
Basophils Absolute: 0.1 10*3/uL (ref 0.0–0.1)
Basophils Relative: 0.9 % (ref 0.0–3.0)
Eosinophils Absolute: 0.3 10*3/uL (ref 0.0–0.7)
Eosinophils Relative: 4.8 % (ref 0.0–5.0)
HCT: 44.3 % (ref 36.0–46.0)
Hemoglobin: 15 g/dL (ref 12.0–15.0)
Lymphocytes Relative: 21.8 % (ref 12.0–46.0)
Lymphs Abs: 1.5 10*3/uL (ref 0.7–4.0)
MCHC: 33.8 g/dL (ref 30.0–36.0)
MCV: 93.8 fL (ref 78.0–100.0)
Monocytes Absolute: 0.5 10*3/uL (ref 0.1–1.0)
Monocytes Relative: 7.5 % (ref 3.0–12.0)
Neutro Abs: 4.6 10*3/uL (ref 1.4–7.7)
Neutrophils Relative %: 65 % (ref 43.0–77.0)
Platelets: 256 10*3/uL (ref 150.0–400.0)
RBC: 4.72 Mil/uL (ref 3.87–5.11)
RDW: 13.4 % (ref 11.5–15.5)
WBC: 7 10*3/uL (ref 4.0–10.5)

## 2023-08-14 LAB — TSH: TSH: 3.87 u[IU]/mL (ref 0.35–5.50)

## 2023-08-14 LAB — BASIC METABOLIC PANEL
BUN: 19 mg/dL (ref 6–23)
CO2: 27 meq/L (ref 19–32)
Calcium: 9.8 mg/dL (ref 8.4–10.5)
Chloride: 105 meq/L (ref 96–112)
Creatinine, Ser: 0.96 mg/dL (ref 0.40–1.20)
GFR: 59.13 mL/min — ABNORMAL LOW (ref >60.00)
Glucose, Bld: 110 mg/dL — ABNORMAL HIGH (ref 70–99)
Potassium: 3.8 meq/L (ref 3.5–5.1)
Sodium: 142 meq/L (ref 135–145)

## 2023-08-14 LAB — PROTIME-INR
INR: 1.1 {ratio} — ABNORMAL HIGH (ref 0.8–1.0)
Prothrombin Time: 11.2 s (ref 9.6–13.1)

## 2023-08-14 LAB — PHOSPHORUS: Phosphorus: 3.5 mg/dL (ref 2.3–4.6)

## 2023-08-14 LAB — APTT: aPTT: 27.9 s (ref 25.4–36.8)

## 2023-08-14 MED ORDER — OMEPRAZOLE 40 MG PO CPDR: 40.0000 mg | DELAYED_RELEASE_CAPSULE | Freq: Every day | ORAL | 1 refills | Status: DC

## 2023-08-14 MED ORDER — LEVOTHYROXINE SODIUM 75 MCG PO TABS
75.0000 ug | ORAL_TABLET | ORAL | 1 refills | Status: DC
Start: 2023-08-14 — End: 2023-08-18

## 2023-08-14 MED ORDER — INDAPAMIDE 1.25 MG PO TABS: 1.2500 mg | ORAL_TABLET | Freq: Every day | ORAL | 0 refills | Status: DC

## 2023-08-14 MED ORDER — ROSUVASTATIN CALCIUM 10 MG PO TABS: 10.0000 mg | ORAL_TABLET | Freq: Every day | ORAL | 1 refills | Status: DC

## 2023-08-14 NOTE — Patient Instructions (Signed)

## 2023-08-14 NOTE — Progress Notes (Signed)
Subjective:  Patient ID: Tracey Boyd, female    DOB: [birth year]  Age: 72 y.o. MRN: [id number]  CC: Annual Exam, Hypertension, Gastroesophageal Reflux, and Hypothyroidism   HPI Tracey Boyd presents for a CPX and f/up ---  Discussed the use of AI scribe software for clinical note transcription with the patient, who gave verbal consent to proceed.  History of Present Illness   The patient, with a history of asthma, presents with a worsening voice condition following a recent procedure at [facility]. She describes the voice as 'worse' and reports difficulty in singing in a choir due to the voice changes. She denies any nodules or laryngitis. She also reports a new onset of difficulty swallowing, specifically with pills, which occurred yesterday. She had to cough up a pill that got stuck, but denies any pain during swallowing or choking on food or liquids. She also denies any heartburn.  The patient is currently off omeprazole and levothyroxine due to running out and requests a refill. She also reports a positive recent osteoporosis test and is on Prolia every six months for treatment. She denies abdominal pain, and also denies any swelling in her legs or feet. She reports no symptoms of elevated blood pressure such as headache, blurred vision, chest pain, or shortness of breath, except for the latter which she attributes to her asthma. She denies any exercise-related symptoms.       Outpatient Medications Prior to Visit  Medication Sig Dispense Refill   albuterol (PROAIR HFA) 108 (90 Base) MCG/ACT inhaler Inhale 2 puffs into the lungs every 6 (six) hours as needed for wheezing or shortness of breath. 18 g 3   B Complex-C (SUPER B COMPLEX PO) Take 1 tablet by mouth daily.     Calcium Carb-Cholecalciferol (CALCIUM/VITAMIN D PO) Take 1,200 mg by mouth daily.      calcium carbonate (OSCAL) 1500 (600 Ca) MG TABS tablet Take by mouth.     Fexofenadine HCl (MUCINEX ALLERGY PO) Take by mouth as needed.      ketoconazole (NIZORAL) 2 % cream ketoconazole 2 % topical cream  APPLY TO THE AFFECTED AREA(S) BY TOPICAL ROUTE ONCE DAILY     mometasone (ELOCON) 0.1 % cream Apply topically.     Multiple Vitamin (MULTIVITAMIN WITH MINERALS) TABS tablet Take 1 tablet by mouth daily. Centrum Silver     Omega-3 Fatty Acids (FISH OIL CONCENTRATE PO) Take 1,200 mg by mouth daily.      Propylene Glycol (SYSTANE BALANCE OP) Place 1 drop into both eyes daily.      Respiratory Therapy Supplies (FLUTTER) DEVI Use as directed 1 each 0   SYMBICORT 160-4.5 MCG/ACT inhaler INHALE 2 PUFFS INTO THE LUNGS TWICE DAILY 30.6 g 1   Triamcinolone Acetonide (NASACORT ALLERGY 24HR NA) Place into the nose as needed.     VITAMIN D PO Take by mouth.     levothyroxine (SYNTHROID) 75 MCG tablet Take 1 tablet (75 mcg total) by mouth every other day. 45 tablet 1   omeprazole (PRILOSEC) 40 MG capsule Take 1 capsule (40 mg total) by mouth daily. 90 capsule 1   No facility-administered medications prior to visit.    ROS Review of Systems  Constitutional:  Negative for appetite change, chills, diaphoresis, fatigue and fever.  HENT:  Positive for trouble swallowing and voice change.   Eyes:  Negative for visual disturbance.  Respiratory:  Positive for choking and shortness of breath. Negative for cough, chest tightness, wheezing and stridor.   Cardiovascular:  Negative  for chest pain, palpitations and leg swelling.  Gastrointestinal:  Negative for abdominal pain, diarrhea, nausea and vomiting.  Genitourinary: Negative.  Negative for difficulty urinating and dysuria.  Musculoskeletal:  Positive for arthralgias. Negative for myalgias.  Skin: Negative.  Negative for color change, pallor and rash.  Neurological: Negative.  Negative for dizziness, weakness and light-headedness.  Hematological:  Negative for adenopathy. Bruises/bleeds easily.  Psychiatric/Behavioral: Negative.      Objective:  BP (!) 160/92 (BP Location: Left Arm,  Patient Position: Sitting, Cuff Size: Normal) Comment: BP (R) 156/88 (L) 160/92  Pulse 75   Temp (!) 97.5 F (36.4 C) (Oral)   Resp 16   Ht 5\' 3"  (1.6 m)   Wt 158 lb 9.6 oz (71.9 kg)   SpO2 95%   BMI 28.09 kg/m   BP Readings from Last 3 Encounters:  08/14/23 (!) 160/92  01/30/23 126/76  08/07/22 126/88    Wt Readings from Last 3 Encounters:  08/14/23 158 lb 9.6 oz (71.9 kg)  09/17/22 158 lb (71.7 kg)  08/07/22 158 lb (71.7 kg)    Physical Exam Vitals reviewed.  HENT:     Nose: Nose normal.     Mouth/Throat:     Mouth: Mucous membranes are moist.  Eyes:     General: No scleral icterus.    Conjunctiva/sclera: Conjunctivae normal.  Cardiovascular:     Rate and Rhythm: Normal rate and regular rhythm.     Heart sounds: No murmur heard.    No gallop.     Comments: EKG- NSR, 69 bpm No LVH, Q waves, or ST/T waves  Pulmonary:     Effort: Pulmonary effort is normal.     Breath sounds: No stridor. No wheezing, rhonchi or rales.  Abdominal:     General: Abdomen is flat.     Palpations: There is no mass.     Tenderness: There is no abdominal tenderness. There is no guarding or rebound.     Hernia: No hernia is present.  Musculoskeletal:     Cervical back: Neck supple.     Right lower leg: No edema.     Left lower leg: No edema.  Lymphadenopathy:     Cervical: No cervical adenopathy.  Skin:    Findings: Bruising present. No erythema or rash.     Comments: Ecchymoses on the chest at the site of EKG leads  Neurological:     General: No focal deficit present.     Mental Status: She is alert. Mental status is at baseline.  Psychiatric:        Mood and Affect: Mood normal.        Behavior: Behavior normal.     Lab Results  Component Value Date   WBC 7.0 08/14/2023   HGB 15.0 08/14/2023   HCT 44.3 08/14/2023   PLT 256.0 08/14/2023   GLUCOSE 110 (H) 08/14/2023   CHOL 209 (H) 01/30/2023   TRIG 79.0 01/30/2023   HDL 66.20 01/30/2023   LDLCALC 127 (H) 01/30/2023    ALT 19 01/30/2023   AST 26 01/30/2023   NA 142 08/14/2023   K 3.8 08/14/2023   CL 105 08/14/2023   CREATININE 0.96 08/14/2023   BUN 19 08/14/2023   CO2 27 08/14/2023   TSH 3.87 08/14/2023   INR 1.1 (H) 08/14/2023    DG Bone Density  Result Date: 07/23/2023 Table formatting from the original result was not included. Date of study: 07/22/2023 Exam: DUAL X-RAY ABSORPTIOMETRY (DXA) FOR BONE MINERAL DENSITY (BMD) Instrument:  GE Therapist, art Provider: PCP Indication: Follow-up for osteoporosis Comparison: 01/15/2021 Clinical data: Pt is a 72 y.o. female with history of wrist fracture.  On calcium, vitamin D, Prolia. Results:  Lumbar spine L1-L4 (L3) Femoral neck (FN) 33% distal radius T-score -1.1 RFN: -2.5 LFN: -2.6 n/a Change in BMD from previous DXA test (%) +6.9%*  +3.0% n/a (*) statistically significant Assessment: Patient has OSTEOPOROSIS according to the [facility] classification for osteoporosis (see below). Fracture risk: high Comments: the technical quality of the study is good, however, L3 vertebra had to be excluded from analysis due to degenerative changes Evaluation for secondary causes should be considered if clinically indicated. Recommend optimizing calcium (1200 mg/day) and vitamin D (800 IU/day). Followup: Repeat BMD is appropriate after 1 to 2 years. WHO criteria for diagnosis of osteoporosis in postmenopausal women and in men 84 y/o or older: - normal: T-score -1.0 to + 1.0 - osteopenia/low bone density: T-score between -2.5 and -1.0 - osteoporosis: T-score below -2.5 - severe osteoporosis: T-score below -2.5 with history of fragility fracture Note: although not part of the WHO classification, the presence of a fragility fracture, regardless of the T-score, should be considered diagnostic of osteoporosis, provided other causes for the fracture have been excluded. Treatment: The National Osteoporosis Foundation recommends that treatment be considered in postmenopausal women and men  age 41 or older with: 1. Hip or vertebral (clinical or morphometric) fracture 2. T-score of - 2.5 or lower at the spine or hip 3. 10-year fracture probability by FRAX of at least 20% for a major osteoporotic fracture and 3% for a hip fracture Carlus Pavlov, MD [facility]   Assessment & Plan:   Age-related osteoporosis without current pathological fracture -     VITAMIN D 25 Hydroxy (Vit-D Deficiency, Fractures); Future -     Phosphorus; Future -     Basic metabolic panel; Future  Hyperlipidemia with target LDL less than 130- Will start a statin for CV risk reduction and will risk stratify with a CCS -     TSH; Future -     Rosuvastatin Calcium; Take 1 tablet (10 mg total) by mouth daily.  Dispense: 90 tablet; Refill: 1 -     AMB Referral VBCI Care Management -     CT CARDIAC SCORING ([facility] LOCATIONS ONLY); Future  Primary hypertension- EKG is negative for LVH. Will start indapamide. -     TSH; Future -     CBC with Differential/Platelet; Future -     Aldosterone + renin activity w/ ratio; Future -     Urinalysis, Routine w reflex microscopic; Future -     EKG 12-Lead -     AMB Referral VBCI Care Management -     Indapamide; Take 1 tablet (1.25 mg total) by mouth daily.  Dispense: 90 tablet; Refill: 0  Gastroesophageal reflux disease without esophagitis -     CBC with Differential/Platelet; Future  Acquired hypothyroidism- She is euthyroid. -     TSH; Future -     Levothyroxine Sodium; Take 1 tablet (75 mcg total) by mouth every other day.  Dispense: 45 tablet; Refill: 1  Encounter for general adult medical examination with abnormal findings - Exam completed, labs reviewed, vaccines reviewed, cancer screenings addressed, pt ed material was given.   Bruising, spontaneous- Labs are normal. -     Protime-INR; Future -     APTT; Future  Laryngopharyngeal reflux -     Omeprazole; Take 1 capsule (40 mg total) by mouth  daily.  Dispense: 90 capsule; Refill: 1      Follow-up: Return in about 3 months (around 11/12/2023).  Sanda Linger, MD

## 2023-08-15 ENCOUNTER — Telehealth: Payer: Self-pay | Admitting: Pharmacy Technician

## 2023-08-15 ENCOUNTER — Telehealth: Payer: Self-pay

## 2023-08-15 NOTE — Progress Notes (Unsigned)
   Care Guide Note  08/15/2023 Name: Lakethia Childrey MRN: [id number] DOB: 03/14/1951  Referred by: Etta Grandchild, MD Reason for referral : Care Coordination (Outreach to schedule with Pharm d )   Dara Volkers is a 72 y.o. year old female who is a primary care patient of Etta Grandchild, MD. Raiana Mane was referred to the pharmacist for assistance related to HTN and HLD.    An unsuccessful telephone outreach was attempted today to contact the patient who was referred to the pharmacy team for assistance with medication management. Additional attempts will be made to contact the patient.   Penne Lash , RMA     [facility]  [facility], [facility] Guide  Direct Dial: [phone]  Website: [email or web address]

## 2023-08-15 NOTE — Telephone Encounter (Signed)
-----   Message from Sanda Linger sent at 08/14/2023 11:55 AM EST ----- Will you see if she can continue prolia?  TJ

## 2023-08-18 ENCOUNTER — Other Ambulatory Visit: Payer: Self-pay | Admitting: Internal Medicine

## 2023-08-18 ENCOUNTER — Other Ambulatory Visit (HOSPITAL_COMMUNITY): Payer: Self-pay

## 2023-08-18 DIAGNOSIS — K219 Gastro-esophageal reflux disease without esophagitis: Secondary | ICD-10-CM

## 2023-08-18 DIAGNOSIS — E039 Hypothyroidism, unspecified: Secondary | ICD-10-CM

## 2023-08-18 NOTE — Telephone Encounter (Signed)
Pharmacy Patient Advocate Encounter   Received notification from  [facility] Portal that prior authorization for PROLIA is required/requested.   Insurance verification completed.   The patient is insured through [employer] .   Per test claim: PA required; PA submitted to above mentioned insurance via Availity Key/confirmation #/[id number] Status is pending

## 2023-08-18 NOTE — Telephone Encounter (Signed)
Prolia VOB initiated via [email or web address] 

## 2023-08-18 NOTE — Progress Notes (Unsigned)
   Care Guide Note  08/18/2023 Name: Minnie Pray MRN: [id number] DOB: February 25, 1951  Referred by: Etta Grandchild, MD Reason for referral : Care Coordination (Outreach to schedule with Pharm d )   Leyda Zevenbergen is a 72 y.o. year old female who is a primary care patient of Etta Grandchild, MD. Anelis Wavra was referred to the pharmacist for assistance related to HTN and HLD.    A second unsuccessful telephone outreach was attempted today to contact the patient who was referred to the pharmacy team for assistance with medication management. Additional attempts will be made to contact the patient.  Penne Lash , RMA     [facility]  [facility], [facility] Guide  Direct Dial: [phone]  Website: [email or web address]

## 2023-08-19 NOTE — Telephone Encounter (Signed)
Pharmacy Patient Advocate Encounter  Received notification from [employer] that Prior Authorization for Prolia has been APPROVED from 08/18/23 to 08/17/24   PA #/Case ID/Reference #: [id number]  Approval letter indexed to media tab

## 2023-08-19 NOTE — Progress Notes (Addendum)
   Care Guide Note  08/19/2023 Name: Tracey Boyd MRN: [id number] DOB: 10-12-1950  Referred by: Etta Grandchild, MD Reason for referral : Care Coordination (Outreach to schedule with Pharm d )   Tracey Boyd is a 72 y.o. year old female who is a primary care patient of Etta Grandchild, MD. Tracey Boyd was referred to the pharmacist for assistance related to HTN and HLD.    Successful contact was made with the patient to discuss pharmacy services. Patient declines engagement at this time. Contact information was provided to the patient should they wish to reach out for assistance at a later time.  Penne Lash , RMA     [facility]  [facility], [facility] Guide  Direct Dial: [phone]  Website: [email or web address]

## 2023-08-21 LAB — ALDOSTERONE + RENIN ACTIVITY W/ RATIO
ALDO / PRA Ratio: 31 {ratio} — ABNORMAL HIGH (ref 0.9–28.9)
Aldosterone: 9 ng/dL
Renin Activity: 0.29 ng/mL/h (ref 0.25–5.82)

## 2023-08-27 ENCOUNTER — Other Ambulatory Visit (HOSPITAL_COMMUNITY): Payer: Self-pay

## 2023-08-27 NOTE — Telephone Encounter (Signed)
Pt ready for scheduling for PROLIA on or after : 08/27/23  Out-of-pocket cost due at time of visit: $346  Primary: [employer] Prolia co-insurance: 20% Admin fee co-insurance: 20%  Secondary: --- Prolia co-insurance:  Admin fee co-insurance:   Medical Benefit Details: Date Benefits were checked: 08/18/23 Deductible: NO/ Coinsurance: 20%/ Admin Fee: 20%  Prior Auth: APPROVED PA# [id number]  Expiration Date: 08/18/23-08/17/24  # of doses approved: 2  Pharmacy benefit: Copay $179.91 If patient wants fill through the pharmacy benefit please send prescription to: [employer], and include estimated need by date in rx notes. Pharmacy will ship medication directly to the office.  Patient NOT eligible for Prolia Copay Card. Copay Card can make patient's cost as little as $25. Link to apply: [email or web address]://[email or web address].[email or web address].[email or web address]/copay  ** This summary of benefits is an estimation of the patient's out-of-pocket cost. Exact cost may very based on individual plan coverage.

## 2023-08-29 ENCOUNTER — Ambulatory Visit
Admission: RE | Admit: 2023-08-29 | Discharge: 2023-08-29 | Disposition: A | Discharge status: Home / Self Care | Payer: Medicare HMO | Source: Ambulatory Visit | Attending: Internal Medicine | Admitting: Internal Medicine

## 2023-08-29 DIAGNOSIS — E785 Hyperlipidemia, unspecified: Secondary | ICD-10-CM

## 2023-09-30 DIAGNOSIS — R490 Dysphonia: Secondary | ICD-10-CM | POA: Diagnosis not present

## 2023-10-20 ENCOUNTER — Other Ambulatory Visit: Payer: Self-pay | Admitting: Internal Medicine

## 2023-10-20 DIAGNOSIS — J4540 Moderate persistent asthma, uncomplicated: Secondary | ICD-10-CM

## 2023-10-20 NOTE — Telephone Encounter (Signed)
Copied from CRM [id number]. Topic: Clinical - Medication Refill >> Oct 20, 2023  1:49 PM Dimitri Ped wrote: Most Recent Primary Care Visit:  Provider: Etta Grandchild  Department: [facility]  Visit Type: OFFICE VISIT  Date: 08/14/2023  Medication: SYMBICORT 160-4.5 MCG/ACT inhaler   Has the patient contacted their pharmacy? No not yet was only doing 1 puff patient is needing more puff  (Agent: If no, request that the patient contact the pharmacy for the refill. If patient does not wish to contact the pharmacy document the reason why and proceed with request.) (Agent: If yes, when and what did the pharmacy advise?)  Is this the correct pharmacy for this prescription? Yes If no, delete pharmacy and type the correct one.  This is the patient's preferred pharmacy:  [id number] [place], [place] - [address] AT CORNER OF [facility] [address] [place] [address] Phone: [phone] Fax: [phone]  [facility] [place], [place] - [address] [facility] AT Portal to Registered Caremark Sites [address] [address] Phone: [phone] Fax: [phone]  [id number] [place], [place] - [address] AT [facility] OF [address] & [facility] [address] [place] [place] [address] Phone: [phone] Fax: [phone]   Has the prescription been filled recently? No  Is the patient out of the medication? No 8 puffs left but asthma is acting up so patient says she needing to take 2 puffs  Has the patient been seen for an appointment in the last year OR does the patient have an upcoming appointment? Yes  Can we respond through MyChart? Yes  Agent: Please be advised that Rx refills may take up to 3 business days. We ask that you follow-up with your pharmacy.

## 2023-10-23 ENCOUNTER — Other Ambulatory Visit: Payer: Self-pay | Admitting: Internal Medicine

## 2023-10-23 DIAGNOSIS — J4540 Moderate persistent asthma, uncomplicated: Secondary | ICD-10-CM

## 2023-10-23 MED ORDER — BUDESONIDE-FORMOTEROL FUMARATE 160-4.5 MCG/ACT IN AERO: 2.0000 | INHALATION_SPRAY | Freq: Two times a day (BID) | RESPIRATORY_TRACT | 1 refills | Status: DC

## 2023-10-23 NOTE — Telephone Encounter (Signed)
Duplicate request. Rx refill request already submitted on 10/20/23.

## 2023-10-23 NOTE — Telephone Encounter (Signed)
Copied from CRM [id number]. Topic: Clinical - Medication Refill >> Oct 23, 2023 10:58 AM Clayton Bibles wrote: Most Recent Primary Care Visit:  Provider: Etta Grandchild  Department: [facility]  Visit Type: OFFICE VISIT  Date: 08/14/2023  Medication: SYMBICORT 160-4.5 MCG/ACT inhaler  Has the patient contacted their pharmacy? No (Agent: If no, request that the patient contact the pharmacy for the refill. If patient does not wish to contact the pharmacy document the reason why and proceed with request.) (Agent: If yes, when and what did the pharmacy advise?)  Is this the correct pharmacy for this prescription? Yes If no, delete pharmacy and type the correct one.  This is the patient's preferred pharmacy:  [id number] - [place], [place] - [address] AT [address] OF [facility] [address] [place] [address] Phone: [phone] Fax: [phone]  Has the prescription been filled recently? No  Is the patient out of the medication? No She has a few puffs left  Has the patient been seen for an appointment in the last year OR does the patient have an upcoming appointment? Yes  Can we respond through MyChart? No  Agent: Please be advised that Rx refills may take up to 3 business days. We ask that you follow-up with your pharmacy.

## 2023-10-30 ENCOUNTER — Other Ambulatory Visit: Payer: Self-pay | Admitting: Internal Medicine

## 2023-10-30 ENCOUNTER — Ambulatory Visit: Payer: Self-pay | Admitting: Internal Medicine

## 2023-10-30 DIAGNOSIS — J4540 Moderate persistent asthma, uncomplicated: Secondary | ICD-10-CM

## 2023-10-30 MED ORDER — TRELEGY ELLIPTA 100-62.5-25 MCG/ACT IN AEPB: 1.0000 | INHALATION_SPRAY | Freq: Every day | RESPIRATORY_TRACT | 0 refills | Status: DC

## 2023-10-30 NOTE — Telephone Encounter (Signed)
 Can you send in a cheaper inhaler for this patient

## 2023-10-30 NOTE — Telephone Encounter (Signed)
  Patient was sent the rx for budesonide-formoterol (SYMBICORT) 160-4.5 MCG/ACT inhaler, but asking about the generic version of it. Would like it to be cheaper. Please follow up.          Copied from CRM [id number]. Topic: Clinical - Prescription Issue >> Oct 30, 2023 11:26 AM Tracey Boyd wrote: Reason for CRM: Patient was sent the rx for budesonide-formoterol (SYMBICORT) 160-4.5 MCG/ACT inhaler, but asking about the generic version of it. Would like it to be cheaper. Please follow up.

## 2023-11-12 ENCOUNTER — Encounter: Payer: Self-pay | Admitting: Internal Medicine

## 2023-11-12 ENCOUNTER — Ambulatory Visit: Payer: Medicare HMO | Admitting: Internal Medicine

## 2023-11-12 ENCOUNTER — Ambulatory Visit: Payer: Medicare HMO

## 2023-11-12 VITALS — BP 128/74 | HR 74 | Temp 97.9°F | Resp 16 | Ht 63.0 in | Wt 157.0 lb

## 2023-11-12 VITALS — BP 128/74 | HR 78 | Ht 63.0 in | Wt 157.0 lb

## 2023-11-12 DIAGNOSIS — Z0000 Encounter for general adult medical examination without abnormal findings: Secondary | ICD-10-CM

## 2023-11-12 DIAGNOSIS — G4451 Hemicrania continua: Secondary | ICD-10-CM | POA: Diagnosis not present

## 2023-11-12 DIAGNOSIS — I10 Essential (primary) hypertension: Secondary | ICD-10-CM

## 2023-11-12 DIAGNOSIS — R413 Other amnesia: Secondary | ICD-10-CM | POA: Diagnosis not present

## 2023-11-12 DIAGNOSIS — R490 Dysphonia: Secondary | ICD-10-CM | POA: Diagnosis not present

## 2023-11-12 DIAGNOSIS — R739 Hyperglycemia, unspecified: Secondary | ICD-10-CM | POA: Diagnosis not present

## 2023-11-12 DIAGNOSIS — E876 Hypokalemia: Secondary | ICD-10-CM

## 2023-11-12 DIAGNOSIS — Z1231 Encounter for screening mammogram for malignant neoplasm of breast: Secondary | ICD-10-CM | POA: Diagnosis not present

## 2023-11-12 DIAGNOSIS — T502X5A Adverse effect of carbonic-anhydrase inhibitors, benzothiadiazides and other diuretics, initial encounter: Secondary | ICD-10-CM

## 2023-11-12 LAB — C-REACTIVE PROTEIN: CRP: <1 mg/dL (ref 0.5–20.0)

## 2023-11-12 LAB — CBC WITH DIFFERENTIAL/PLATELET
Basophils Absolute: 0 10*3/uL (ref 0.0–0.1)
Basophils Relative: 0.6 % (ref 0.0–3.0)
Eosinophils Absolute: 0.1 10*3/uL (ref 0.0–0.7)
Eosinophils Relative: 2.5 % (ref 0.0–5.0)
HCT: 41.8 % (ref 36.0–46.0)
Hemoglobin: 14 g/dL (ref 12.0–15.0)
Lymphocytes Relative: 20 % (ref 12.0–46.0)
Lymphs Abs: 1.1 10*3/uL (ref 0.7–4.0)
MCHC: 33.4 g/dL (ref 30.0–36.0)
MCV: 91.8 fl (ref 78.0–100.0)
Monocytes Absolute: 0.6 10*3/uL (ref 0.1–1.0)
Monocytes Relative: 10.9 % (ref 3.0–12.0)
Neutro Abs: 3.6 10*3/uL (ref 1.4–7.7)
Neutrophils Relative %: 66 % (ref 43.0–77.0)
Platelets: 210 10*3/uL (ref 150.0–400.0)
RBC: 4.55 Mil/uL (ref 3.87–5.11)
RDW: 13.5 % (ref 11.5–15.5)
WBC: 5.5 10*3/uL (ref 4.0–10.5)

## 2023-11-12 LAB — BASIC METABOLIC PANEL
BUN: 19 mg/dL (ref 6–23)
CO2: 34 meq/L — ABNORMAL HIGH (ref 19–32)
Calcium: 9.7 mg/dL (ref 8.4–10.5)
Chloride: 101 meq/L (ref 96–112)
Creatinine, Ser: 0.87 mg/dL (ref 0.40–1.20)
GFR: 66.43 mL/min (ref >60.00)
Glucose, Bld: 97 mg/dL (ref 70–99)
Potassium: 3.3 meq/L — ABNORMAL LOW (ref 3.5–5.1)
Sodium: 142 meq/L (ref 135–145)

## 2023-11-12 LAB — HEMOGLOBIN A1C: Hgb A1c MFr Bld: 6 % (ref 4.6–6.5)

## 2023-11-12 MED ORDER — POTASSIUM CHLORIDE ER 10 MEQ PO TBCR: 10.0000 meq | EXTENDED_RELEASE_TABLET | Freq: Two times a day (BID) | ORAL | 0 refills | Status: DC

## 2023-11-12 NOTE — Patient Instructions (Signed)
 General Headache Without Cause A headache is pain or discomfort you feel around the head or neck area. There are many causes and types of headaches. In some cases, the cause may not be found. Follow these instructions at home: Watch your condition for any changes. Let your doctor know about them. Take these steps to help with your condition: Managing pain     Take over-the-counter and prescription medicines only as told by your doctor. This includes medicines for pain that are taken by mouth or put on the skin. Lie down in a dark, quiet room when you have a headache. If told, put ice on your head and neck area: Put ice in a plastic bag. Place a towel between your skin and the bag. Leave the ice on for 20 minutes, 2-3 times per day. Take off the ice if your skin turns bright red. This is very important. If you cannot feel pain, heat, or cold, you have a greater risk of damage to the area. If told, put heat on the affected area. Use the heat source that your doctor recommends, such as a moist heat pack or a heating pad. Place a towel between your skin and the heat source. Leave the heat on for 20-30 minutes. Take off the heat if your skin turns bright red. This is very important. If you cannot feel pain, heat, or cold, you have a greater risk of getting burned. Keep lights dim if bright lights bother you or make your headaches worse. Eating and drinking Eat meals on a regular schedule. If you drink alcohol: Limit how much you have to: 0-1 drink a day for women who are not pregnant. 0-2 drinks a day for men. Know how much alcohol is in a drink. In the [place] one drink equals one 12 oz bottle of beer (355 mL), one 5 oz glass of wine (148 mL), or one 1 oz glass of hard liquor (44 mL). Stop drinking caffeine, or drink less caffeine. General instructions  Keep a journal to find out if certain things bring on headaches. For example, write down: What you eat and drink. How much sleep you  get. Any change to your diet or medicines. Get a massage or try other ways to relax. Limit stress. Sit up straight. Do not tighten (tense) your muscles. Do not smoke or use any products that contain nicotine or tobacco. If you need help quitting, ask your doctor. Exercise regularly as told by your doctor. Get enough sleep. This often means 7-9 hours of sleep each night. Keep all follow-up visits. This is important. Contact a doctor if: Medicine does not help your symptoms. You have a headache that feels different than the other headaches. You feel like you may vomit (nauseous) or you vomit. You have a fever. Get help right away if: Your headache: Gets very bad quickly. Gets worse after a lot of physical activity. You have any of these symptoms: You continue to vomit. A stiff neck. Trouble seeing. Your eye or ear hurts. Trouble speaking. Weak muscles or you lose muscle control. You lose your balance or have trouble walking. You feel like you will pass out (faint) or you pass out. You are mixed up (confused). You have a seizure. These symptoms may be an emergency. Get help right away. Call your local emergency services (911 in the [place] Do not wait to see if the symptoms will go away. Do not drive yourself to the hospital. Summary A headache is pain or discomfort that  is felt around the head or neck area. There are many causes and types of headaches. In some cases, the cause may not be found. Keep a journal to help find out what causes your headaches. Watch your condition for any changes. Let your doctor know about them. Contact a doctor if you have a headache that is different from usual, or if medicine does not help your headache. Get help right away if your headache gets very bad, you throw up, you have trouble seeing, you lose your balance, or you have a seizure. This information is not intended to replace advice given to you by your health care provider. Make sure you  discuss any questions you have with your health care provider. Document Revised: 01/24/2021 Document Reviewed: 01/24/2021 Elsevier Patient Education  2024 [employer].

## 2023-11-12 NOTE — Progress Notes (Unsigned)
 Subjective:  Patient ID: Tracey Boyd, female    DOB: 1951-06-12  Age: 73 y.o. MRN: [id number]  CC: Headache and Hypertension   HPI Tracey Boyd presents for f/up ----  Discussed the use of AI scribe software for clinical note transcription with the patient, who gave verbal consent to proceed.  History of Present Illness   Tracey Boyd is a 73 year old female who presents with voice difficulties and tongue dysfunction. She is accompanied by Annette Stable, who assists in providing her medical history. She was referred by an ENT specialist for evaluation of her voice difficulties and tongue dysfunction.  She has experienced voice difficulties for several years, characterized by a gradual onset and inconsistency. Her voice is described as 'raspy', and speaking is exhausting. 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 Her tongue sometimes feels enlarged, similar to the sensation after novocaine, affecting her ability to enunciate clearly. No slurred speech, but enunciation is not as clear as before. These symptoms have persisted since last summer, following the vocal fold injections.  She has a history of asthma and is currently using Symbicort, which she finds effective. There was a recent attempt to switch to Trelegy due to insurance issues, but she has continued with Symbicort. She experiences wheezing and shortness of breath during physical activity, particularly in winter, and uses her inhaler to manage these symptoms.  She reports occasional burping and reflux, for which she has switched from omeprazole to pantoprazole, finding it more effective. No consistent heartburn or indigestion.  Recently, she has been experiencing headaches for the past few weeks, described as severe and throbbing, with no consistent location. The headaches are sudden and transient, affecting different parts of her  head.  She mentions some memory issues, which have not been previously evaluated by a neurologist.       Outpatient Medications Prior to Visit  Medication Sig Dispense Refill   albuterol (PROAIR HFA) 108 (90 Base) MCG/ACT inhaler Inhale 2 puffs into the lungs every 6 (six) hours as needed for wheezing or shortness of breath. 18 g 3   B Complex-C (SUPER B COMPLEX PO) Take 1 tablet by mouth daily.     Calcium Carb-Cholecalciferol (CALCIUM/VITAMIN D PO) Take 1,200 mg by mouth daily.      calcium carbonate (OSCAL) 1500 (600 Ca) MG TABS tablet Take by mouth.     Fexofenadine HCl (MUCINEX ALLERGY PO) Take by mouth as needed.     ketoconazole (NIZORAL) 2 % cream ketoconazole 2 % topical cream  APPLY TO THE AFFECTED AREA(S) BY TOPICAL ROUTE ONCE DAILY     levothyroxine (SYNTHROID) 75 MCG tablet TAKE 1 TABLET EVERY OTHER  DAY 45 tablet 1   mometasone (ELOCON) 0.1 % cream Apply topically.     Multiple Vitamin (MULTIVITAMIN WITH MINERALS) TABS tablet Take 1 tablet by mouth daily. Centrum Silver     Omega-3 Fatty Acids (FISH OIL CONCENTRATE PO) Take 1,200 mg by mouth daily.      omeprazole (PRILOSEC) 40 MG capsule TAKE 1 CAPSULE DAILY 90 capsule 1   Propylene Glycol (SYSTANE BALANCE OP) Place 1 drop into both eyes daily.      Respiratory Therapy Supplies (FLUTTER) DEVI Use as directed 1 each 0   rosuvastatin (CRESTOR) 10 MG tablet Take 1 tablet (10 mg total) by mouth daily. 90 tablet 1   VITAMIN D PO Take by mouth.     indapamide (LOZOL) 1.25 MG  tablet Take 1 tablet (1.25 mg total) by mouth daily. 90 tablet 0   Triamcinolone Acetonide (NASACORT ALLERGY 24HR NA) Place into the nose as needed.     Fluticasone-Umeclidin-Vilant (TRELEGY ELLIPTA) 100-62.5-25 MCG/ACT AEPB Inhale 1 puff into the lungs daily. 120 each 0   No facility-administered medications prior to visit.    ROS Review of Systems  Constitutional:  Negative for appetite change, chills, fatigue and fever.  HENT:  Positive for voice  change.   Respiratory:  Positive for wheezing. Negative for cough, choking, chest tightness, shortness of breath and stridor.   Cardiovascular:  Negative for chest pain, palpitations and leg swelling.  Gastrointestinal: Negative.  Negative for abdominal pain, constipation, diarrhea, nausea and vomiting.  Endocrine: Negative.   Genitourinary: Negative.  Negative for difficulty urinating.  Musculoskeletal: Negative.   Neurological:  Positive for speech difficulty and headaches. Negative for dizziness, syncope, weakness, light-headedness and numbness.  Hematological:  Negative for adenopathy. Does not bruise/bleed easily.  Psychiatric/Behavioral:  Positive for confusion and decreased concentration.     Objective:  BP 128/74 (BP Location: Left Arm, Patient Position: Sitting, Cuff Size: Normal)   Pulse 74   Temp 97.9 F (36.6 C) (Oral)   Resp 16   Ht 5\' 3"  (1.6 m)   Wt 157 lb (71.2 kg)   SpO2 94%   BMI 27.81 kg/m   BP Readings from Last 3 Encounters:  11/12/23 128/74  11/12/23 128/74  08/14/23 (!) 160/92    Wt Readings from Last 3 Encounters:  11/12/23 157 lb (71.2 kg)  11/12/23 157 lb (71.2 kg)  08/14/23 158 lb 9.6 oz (71.9 kg)    Physical Exam Vitals reviewed.  Constitutional:      Appearance: She is not ill-appearing.  HENT:     Mouth/Throat:     Mouth: Mucous membranes are moist.  Eyes:     General: No scleral icterus.    Conjunctiva/sclera: Conjunctivae normal.  Cardiovascular:     Rate and Rhythm: Normal rate and regular rhythm.     Heart sounds: No murmur heard.    No friction rub. No gallop.  Pulmonary:     Effort: Pulmonary effort is normal.     Breath sounds: No stridor. No wheezing, rhonchi or rales.  Abdominal:     General: Abdomen is flat.     Palpations: There is no mass.     Tenderness: There is no abdominal tenderness. There is no guarding.     Hernia: No hernia is present.  Musculoskeletal:        General: Normal range of motion.     Cervical  back: Neck supple.     Right lower leg: No edema.     Left lower leg: No edema.  Lymphadenopathy:     Cervical: No cervical adenopathy.  Skin:    General: Skin is warm and dry.  Neurological:     General: No focal deficit present.     Mental Status: She is alert.  Psychiatric:        Mood and Affect: Mood normal.        Behavior: Behavior normal.     Lab Results  Component Value Date   WBC 5.5 11/12/2023   HGB 14.0 11/12/2023   HCT 41.8 11/12/2023   PLT 210.0 11/12/2023   GLUCOSE 97 11/12/2023   CHOL 209 (H) 01/30/2023   TRIG 79.0 01/30/2023   HDL 66.20 01/30/2023   LDLCALC 127 (H) 01/30/2023   ALT 19 01/30/2023   AST 26  01/30/2023   NA 142 11/12/2023   K 3.3 (L) 11/12/2023   CL 101 11/12/2023   CREATININE 0.87 11/12/2023   BUN 19 11/12/2023   CO2 34 (H) 11/12/2023   TSH 3.87 08/14/2023   INR 1.1 (H) 08/14/2023   HGBA1C 6.0 11/12/2023    CT CARDIAC SCORING ([facility] LOCATIONS ONLY) Result Date: 08/29/2023 CLINICAL DATA:  73 year old Caucasian female with history of hyperlipidemia. * Tracking Code: FCC * EXAM: CT CARDIAC CORONARY ARTERY CALCIUM SCORE TECHNIQUE: Non-contrast imaging through the heart was performed using prospective ECG gating. Image post processing was performed on an independent workstation, allowing for quantitative analysis of the heart and coronary arteries. Note that this exam targets the heart and the chest was not imaged in its entirety. COMPARISON:  Chest CT 03/27/2022. FINDINGS: CORONARY CALCIUM SCORES: Left Main: 0 LAD: 0 LCx: 0 RCA/PDA: 0 Total Agatston Score: 0 MESA database percentile: N/A AORTA MEASUREMENTS: Ascending Aorta: 3.6 cm Descending Aorta:2.5 cm OTHER FINDINGS: Scattered areas of mild scarring are noted in the lungs bilaterally. Atherosclerotic calcifications in the thoracic aorta. Within the visualized portions of the thorax there are no suspicious appearing pulmonary nodules or masses, there is no acute consolidative airspace disease, no  pleural effusions, no pneumothorax and no lymphadenopathy. Visualized portions of the upper abdomen are unremarkable. There are no aggressive appearing lytic or blastic lesions noted in the visualized portions of the skeleton. IMPRESSION: 1. Patient's total coronary artery calcium score is 0 which indicates a very low (but non-zero) risk of significant coronary artery atherosclerosis. 2.  Aortic Atherosclerosis (ICD10-I70.0). Electronically Signed   By: Trudie Reed M.D.   On: 08/29/2023 12:26    Assessment & Plan:  Memory changes -     MR BRAIN WO CONTRAST; Future  Hemicrania continua -     C-reactive protein; Future -     CBC with Differential/Platelet; Future -     MR BRAIN WO CONTRAST; Future  Chronic hyperglycemia -     Hemoglobin A1c; Future -     Basic metabolic panel; Future  Dysphonia -     Ambulatory referral to Neurology  Diuretic-induced hypokalemia -     Potassium Chloride ER; Take 1 tablet (10 mEq total) by mouth 2 (two) times daily.  Dispense: 60 tablet; Refill: 0  Primary hypertension- Her blood pressure is normal and she is hypokalemic.  Will discontinue indapamide and start a potassium supplement.     Follow-up: Return in about 3 months (around 02/12/2024).  Sanda Linger, MD

## 2023-11-12 NOTE — Patient Instructions (Addendum)
 Ms. Slatten , Thank you for taking time to come for your Medicare Wellness Visit. I appreciate your ongoing commitment to your health goals. Please review the following plan we discussed and let me know if I can assist you in the future.   Referrals/Orders/Follow-Ups/Clinician Recommendations: Aim for 30 minutes of exercise or brisk walking, 6-8 glasses of water, and 5 servings of fruits and vegetables each day. Mammogram ordered for 03/2024.  Educated and advised on the COVID vaccines.  This is a list of the screening recommended for you and due dates:  Health Maintenance  Topic Date Due   COVID-19 Vaccine (7 - 2024-25 season) 05/11/2023   Mammogram  04/01/2024   Medicare Annual Wellness Visit  11/11/2024   Colon Cancer Screening  07/08/2029   DTaP/Tdap/Td vaccine (3 - Td or Tdap) 01/12/2031   Pneumonia Vaccine  Completed   Flu Shot  Completed   DEXA scan (bone density measurement)  Completed   Hepatitis C Screening  Completed   Zoster (Shingles) Vaccine  Completed   HPV Vaccine  Aged Out    Advanced directives: (Copy Requested) Please bring a copy of your health care power of attorney and living will to the office to be added to your chart at your convenience.  Next Medicare Annual Wellness Visit scheduled for next year: Yes - 11/2024

## 2023-11-12 NOTE — Progress Notes (Addendum)
 Subjective:   Tracey Boyd is a 73 y.o. who presents for a Medicare Wellness preventive visit.  Visit Complete: In person    AWV Questionnaire: No: Patient Medicare AWV questionnaire was not completed prior to this visit.  Cardiac Risk Factors include: advanced age (>64men, >69 women)     Objective:    Today's Vitals   11/12/23 1057  BP: 128/74  Pulse: 78  SpO2: 95%  Weight: 157 lb (71.2 kg)  Height: 5\' 3"  (1.6 m)   Body mass index is 27.81 kg/m.     11/12/2023   10:52 AM 09/17/2022    8:51 AM 11/01/2019    8:32 AM 07/29/2019   11:27 AM 07/22/2018   11:39 AM 11/08/2016    2:24 PM 03/11/2016   11:54 AM  Advanced Directives  Does Patient Have a Medical Advance Directive? Yes Yes Yes Yes Yes Yes Yes  Type of Estate agent of [place];Living will Healthcare Power of [place];Living will Healthcare Power of [place];Living will Healthcare Power of [place];Living will Healthcare Power of [place];Living will Living will Healthcare Power of [place];Living will  Does patient want to make changes to medical advance directive?      No - Patient declined No - Patient declined  Copy of Healthcare Power of Attorney in Chart? No - copy requested No - copy requested No - copy requested No - copy requested No - copy requested  No - copy requested  Would patient like information on creating a medical advance directive?      No - Patient declined     Current Medications (verified) Outpatient Encounter Medications as of 11/12/2023  Medication Sig   albuterol (PROAIR HFA) 108 (90 Base) MCG/ACT inhaler Inhale 2 puffs into the lungs every 6 (six) hours as needed for wheezing or shortness of breath.   B Complex-C (SUPER B COMPLEX PO) Take 1 tablet by mouth daily.   Calcium Carb-Cholecalciferol (CALCIUM/VITAMIN D PO) Take 1,200 mg by mouth daily.    calcium carbonate (OSCAL) 1500 (600 Ca) MG TABS tablet Take by mouth.   Fexofenadine HCl (MUCINEX ALLERGY PO) Take by mouth as  needed.   ketoconazole (NIZORAL) 2 % cream ketoconazole 2 % topical cream  APPLY TO THE AFFECTED AREA(S) BY TOPICAL ROUTE ONCE DAILY   levothyroxine (SYNTHROID) 75 MCG tablet TAKE 1 TABLET EVERY OTHER  DAY   mometasone (ELOCON) 0.1 % cream Apply topically.   Multiple Vitamin (MULTIVITAMIN WITH MINERALS) TABS tablet Take 1 tablet by mouth daily. Centrum Silver   Omega-3 Fatty Acids (FISH OIL CONCENTRATE PO) Take 1,200 mg by mouth daily.    omeprazole (PRILOSEC) 40 MG capsule TAKE 1 CAPSULE DAILY   Propylene Glycol (SYSTANE BALANCE OP) Place 1 drop into both eyes daily.    Respiratory Therapy Supplies (FLUTTER) DEVI Use as directed   rosuvastatin (CRESTOR) 10 MG tablet Take 1 tablet (10 mg total) by mouth daily.   Triamcinolone Acetonide (NASACORT ALLERGY 24HR NA) Place into the nose as needed.   VITAMIN D PO Take by mouth.   No facility-administered encounter medications on file as of 11/12/2023.    Allergies (verified) Ace inhibitors, Penicillins, Prednisone, Sulfa antibiotics, and Tetracycline   History: Past Medical History:  Diagnosis Date   Allergy    Anemia    as a child   Anxiety    Asthma    Dermatitis herpetiformis    GERD (gastroesophageal reflux disease)    HTN (hypertension)    Hypothyroidism    Osteoporosis  Past Surgical History:  Procedure Laterality Date   APPENDECTOMY     BIOPSY  11/01/2019   Procedure: BIOPSY;  Surgeon: Sherrilyn Rist, MD;  Location: [facility];  Service: Gastroenterology;;   Ancil Linsey [facility] STUDY N/A 11/01/2019   Procedure: Ancil Linsey [facility] STUDY;  Surgeon: Sherrilyn Rist, MD;  Location: [facility];  Service: Gastroenterology;  Laterality: N/A;   COLONOSCOPY     CYSTECTOMY     ESOPHAGOGASTRODUODENOSCOPY (EGD) WITH PROPOFOL N/A 11/01/2019   Procedure: ESOPHAGOGASTRODUODENOSCOPY (EGD) WITH PROPOFOL;  Surgeon: Sherrilyn Rist, MD;  Location: [facility];  Service: Gastroenterology;  Laterality: N/A;   ORIF WRIST FRACTURE Left 11/08/2016    Procedure: OPEN REDUCTION INTERNAL FIXATION (ORIF) LEFT WRIST FRACTURE;  Surgeon: Dominica Severin, MD;  Location: [facility] OR;  Service: Orthopedics;  Laterality: Left;   Family History  Problem Relation Age of Onset   Multiple sclerosis Mother    Lung cancer Mother    Colon polyps Mother    Alcohol abuse Father    Multiple sclerosis Sister    Dementia Sister    Multiple sclerosis Paternal Aunt    Other Grandchild        special needs   Colon cancer Neg Hx    Esophageal cancer Neg Hx    Rectal cancer Neg Hx    Stomach cancer Neg Hx    Crohn's disease Neg Hx    Ulcerative colitis Neg Hx    Social History   Socioeconomic History   Marital status: Married    Spouse name: Not on file   Number of children: 1   Years of education: Not on file   Highest education level: Associate degree: academic program  Occupational History   Occupation: retired  Tobacco Use   Smoking status: Never    Passive exposure: Never (PARENTS SMOKED)   Smokeless tobacco: Never   Tobacco comments:    pt states both parents were smokers  Vaping Use   Vaping status: Never Used  Substance and Sexual Activity   Alcohol use: Yes    Alcohol/week: 1.0 standard drink of alcohol    Types: 1 Glasses of wine per week    Comment: occasionally   Drug use: No   Sexual activity: Not Currently  Other Topics Concern   Not on file  Social History Narrative   Not on file   Social Drivers of Health   Financial Resource Strain: Low Risk  (11/12/2023)   Overall Financial Resource Strain (CARDIA)    Difficulty of Paying Living Expenses: Not hard at all  Food Insecurity: No Food Insecurity (11/12/2023)   Hunger Vital Sign    Worried About Running Out of Food in the Last Year: Never true    Ran Out of Food in the Last Year: Never true  Transportation Needs: No Transportation Needs (11/12/2023)   PRAPARE - Administrator, Civil Service (Medical): No    Lack of Transportation (Non-Medical): No  Physical Activity:  Sufficiently Active (11/12/2023)   Exercise Vital Sign    Days of Exercise per Week: 5 days    Minutes of Exercise per Session: 30 min  Recent Concern: Physical Activity - Insufficiently Active (11/07/2023)   Exercise Vital Sign    Days of Exercise per Week: 2 days    Minutes of Exercise per Session: 30 min  Stress: No Stress Concern Present (11/12/2023)   [employer] of Occupational Health - Occupational Stress Questionnaire    Feeling of Stress : Not at  all  Social Connections: Moderately Integrated (11/12/2023)   Social Connection and Isolation Panel [NHANES]    Frequency of Communication with Friends and Family: More than three times a week    Frequency of Social Gatherings with Friends and Family: More than three times a week    Attends Religious Services: More than 4 times per year    Active Member of [employer] or Organizations: No    Attends Engineer, structural: Never    Marital Status: Married    Tobacco Counseling - Non Smoker Counseling given: Yes Tobacco comments: pt states both parents were smokers    Clinical Intake:  Pre-visit preparation completed: Yes  Pain : No/denies pain     BMI - recorded: 27.81 Nutritional Status: BMI 25 -29 Overweight Nutritional Risks: None  How often do you need to have someone help you when you read instructions, pamphlets, or other written materials from your doctor or pharmacy?: 1 - Never  Interpreter Needed?: No  Information entered by :: Hassell Halim, CMA   Activities of Daily Living     11/12/2023   10:56 AM  In your present state of health, do you have any difficulty performing the following activities:  Hearing? 0  Vision? 0  Difficulty concentrating or making decisions? 0  Walking or climbing stairs? 0  Dressing or bathing? 0  Doing errands, shopping? 0  Preparing Food and eating ? N  Using the Toilet? N  In the past six months, have you accidently leaked urine? N  Do you have problems with loss of bowel  control? N  Managing your Medications? N  Managing your Finances? N  Housekeeping or managing your Housekeeping? N    Patient Care Team: Etta Grandchild, MD as PCP - General (Internal Medicine) Richardean Chimera, MD as Consulting Physician (Obstetrics and Gynecology)  Indicate any recent Medical Services you may have received from other than [facility] providers in the past year (date may be approximate).     Assessment:   This is a routine wellness examination for [place].  Hearing/Vision screen Hearing Screening - Comments:: Denies hearing difficulties   Vision Screening - Comments:: Wears rx glasses - up to date with routine eye exams with [facility]   Goals Addressed               This Visit's Progress     Patient Stated (pt-stated)        Patient stated she plans to stay active (exercising - using the elliptical).       Depression Screen     11/12/2023   11:06 AM 11/12/2023    9:59 AM 08/14/2023    9:50 AM 09/17/2022    8:51 AM 08/07/2022    1:47 PM 01/11/2021   10:09 AM 07/29/2019   11:30 AM  PHQ 2/9 Scores  PHQ - 2 Score 0 0 0 0 0 0 1  PHQ- 9 Score 0  0 0 0  3    Fall Risk     11/12/2023   10:58 AM 11/12/2023    9:59 AM 08/14/2023    9:50 AM 09/17/2022    8:55 AM 03/19/2022   11:04 AM  Fall Risk   Falls in the past year? 1 1 0 0 1  Number falls in past yr: 0 0 0 0 0  Injury with Fall? 0 0 0 0 1  Risk for fall due to :  No Fall Risks No Fall Risks No Fall Risks No Fall Risks  Follow up Falls evaluation completed;Falls prevention discussed Falls evaluation completed Falls evaluation completed Falls prevention discussed Falls evaluation completed    MEDICARE RISK AT HOME:  Medicare Risk at Home Any stairs in or around the home?: No If so, are there any without handrails?: No Home free of loose throw rugs in walkways, pet beds, electrical cords, etc?: Yes Adequate lighting in your home to reduce risk of falls?: Yes Life alert?: No Use of a cane, walker or  w/c?: No Grab bars in the bathroom?: No Shower chair or bench in shower?: No Elevated toilet seat or a handicapped toilet?: No  TIMED UP AND GO:  Was the test performed?  No  Cognitive Function: 6CIT completed        11/12/2023   11:00 AM 09/17/2022    8:55 AM  6CIT Screen  What Year? 0 points 0 points  What month? 0 points 0 points  What time? 0 points 0 points  Count back from 20 0 points 0 points  Months in reverse 0 points 0 points  Repeat phrase 2 points 0 points  Total Score 2 points 0 points    Immunizations Immunization History  Administered Date(s) Administered   Fluad Quad(high Dose 65+) 06/14/2019   Influenza Inj Mdck Quad Pf 07/04/2017   Influenza Split 07/10/2012   Influenza Whole 08/31/2010, 05/20/2011   Influenza, High Dose Seasonal PF 06/09/2018, 06/01/2022   Influenza,inj,Quad PF,6+ Mos 07/22/2013, 08/25/2014   Influenza-Unspecified 05/10/2021, 05/31/2022, 05/20/2023   Moderna SARS-COV2 Booster Vaccination 12/08/2020   PFIZER(Purple Top)SARS-COV-2 Vaccination 10/14/2019, 11/04/2019, 06/08/2020, 05/28/2021   Pfizer(Comirnaty)Fall Seasonal Vaccine 12 years and older 06/01/2022   Pneumococcal Conjugate-13 03/07/2016   Pneumococcal Polysaccharide-23 07/16/2017   Td 08/31/2010   Tdap 01/11/2021   Zoster Recombinant(Shingrix) 01/11/2021, 03/27/2021   Zoster, Live 11/01/2011    Screening Tests Health Maintenance  Topic Date Due   COVID-19 Vaccine (7 - 2024-25 season) 05/11/2023   MAMMOGRAM  04/01/2024   Medicare Annual Wellness (AWV)  11/11/2024   Colonoscopy  07/08/2029   DTaP/Tdap/Td (3 - Td or Tdap) 01/12/2031   Pneumonia Vaccine 58+ Years old  Completed   INFLUENZA VACCINE  Completed   DEXA SCAN  Completed   Hepatitis C Screening  Completed   Zoster Vaccines- Shingrix  Completed   HPV VACCINES  Aged Out    Health Maintenance  Health Maintenance Due  Topic Date Due   COVID-19 Vaccine (7 - 2024-25 season) 05/11/2023   Health Maintenance  Items Addressed:11/12/2023 Mammogram ordered  Additional Screening:  Vision Screening: Recommended annual ophthalmology exams for early detection of glaucoma and other disorders of the eye. Pt has annual eye exam at [facility].  Dental Screening: Recommended annual dental exams for proper oral hygiene  Community Resource Referral / Chronic Care Management: CRR required this visit?  No   CCM required this visit?  No     Plan:     I have personally reviewed and noted the following in the patient's chart:   Medical and social history Use of alcohol, tobacco or illicit drugs  Current medications and supplements including opioid prescriptions. Patient is not currently taking opioid prescriptions. Functional ability and status Nutritional status Physical activity Advanced directives List of other physicians Hospitalizations, surgeries, and ER visits in previous 12 months Vitals Screenings to include cognitive, depression, and falls Referrals and appointments  In addition, I have reviewed and discussed with patient certain preventive protocols, quality metrics, and best practice recommendations. A written personalized care plan for preventive services  as well as general preventive health recommendations were provided to patient.     Darreld Mclean, CMA   11/12/2023   After Visit Summary: (MyChart) Due to this being a telephonic visit, the after visit summary with patients personalized plan was offered to patient via MyChart   Notes: Please refer to Routing Comments.

## 2023-11-13 DIAGNOSIS — I10 Essential (primary) hypertension: Secondary | ICD-10-CM | POA: Insufficient documentation

## 2023-11-14 ENCOUNTER — Encounter: Payer: Self-pay | Admitting: Internal Medicine

## 2023-11-14 ENCOUNTER — Ambulatory Visit
Admission: RE | Admit: 2023-11-14 | Discharge: 2023-11-14 | Disposition: A | Discharge status: Home / Self Care | Source: Ambulatory Visit | Attending: Internal Medicine | Admitting: Internal Medicine

## 2023-11-14 DIAGNOSIS — R413 Other amnesia: Secondary | ICD-10-CM

## 2023-11-14 DIAGNOSIS — G4451 Hemicrania continua: Secondary | ICD-10-CM

## 2023-11-14 DIAGNOSIS — R519 Headache, unspecified: Secondary | ICD-10-CM | POA: Diagnosis not present

## 2023-11-18 ENCOUNTER — Encounter: Payer: Self-pay | Admitting: Neurology

## 2023-11-27 NOTE — Progress Notes (Signed)
 Assessment/Plan:   Dysphonia  -Patient does have left ptosis (unclear if this is just pseudoptosis from underlying lid lag, but ice test appeared to be somewhat positive) and there is at least some degree of dysphagia, per husband.  Given that, I do think that we should probably rule out MG as a cause of the symptoms.  We will go ahead and do lab work.  At this point, I held on EMG, since she does not have any limb symptoms.  Subjective:   Tracey Boyd was seen today in the movement disorders clinic for neurologic consultation at the request of Etta Grandchild, MD.  Pt with husband who supplements hx.  The consultation is for the evaluation of hypophonic speech.  She has been following with Dr. Vergie Living at [facility], although she is on several ENT doctors prior to him.  Voice changes have been going on at least for 5 years.  She was first worked up by gastroenterology and workup was negative.  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  In July, 2024 the patient underwent bilateral vocal fold augmentation with Restylane.  Ultimately, they did not think that this was very helpful.  Because that trial was not helpful, the ENT physician did not recommend a permanent augmentation through of her vocal folds through thyroplasty.  She did not find behavioral voice therapy beneficial.  The ENT physician wondered if she had "coordination deficit of some of the bulbar musculature."  He wanted her to have evaluation by a neurologist.  She was referred to [facility] neurology and has an appointment with a nurse practitioner there in September, 2025.  She did not want to wait that long, so requested referral here from primary care.  MRI brain was just done by her primary care physician on March 7 and was unremarkable.  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  She has noted  voice change x 5+ years.  The voice has gotten progressively worse with time.  She reports that her asthma will exacerbate the sx's.  She has excessive phelgm and mucous as well.  She has some choking on liquids per husband but pt states that "this is not often."  She has trouble with large pills and went to chewable calcium.  She has no diplopia but she states that one of her eyes will "jump."  She does have hx of strabismus as young child.    Specific Symptoms:  Tremor: No. Family hx of similar:  No. Wet Pillows: No. Postural symptoms:  No.  Falls?  Few over few years; last big in 10/2016 - tripped over curb; the last fall was summer 2024 and she was carrying a bunch of things in hand and missed a step - no injury Loss of smell:  No. Loss of taste:  No. Urinary Incontinence:  No. Handwriting, micrographia: No. Trouble with ADL's:  No.  Trouble buttoning clothing: No. Depression:  No. N/V:  No. Lightheaded:  No.  Syncope: No.   ALLERGIES:   Allergies  Allergen Reactions   Ace Inhibitors Cough    Reaction to lisinopril   Penicillins Hives    Has patient had a PCN reaction causing immediate rash, facial/tongue/throat swelling, SOB or lightheadedness with hypotension: Yes Has patient had a PCN reaction causing severe rash involving mucus membranes or skin necrosis: No Has patient had a  PCN reaction that required hospitalization No Has patient had a PCN reaction occurring within the last 10 years: No If all of the above answers are "NO", then may proceed with Cephalosporin use.   Prednisone Other (See Comments)    REACTION: paresthesias   Sulfa Antibiotics Hives   Tetracycline Other (See Comments)    CURRENT MEDICATIONS:  Current Meds  Medication Sig   albuterol (PROAIR HFA) 108 (90 Base) MCG/ACT inhaler Inhale 2 puffs into the lungs every 6 (six) hours as needed for wheezing or shortness of breath.   B Complex-C (SUPER B COMPLEX PO) Take 1 tablet by mouth daily.   Calcium  Carb-Cholecalciferol (CALCIUM/VITAMIN D PO) Take 1,200 mg by mouth daily.    calcium carbonate (OSCAL) 1500 (600 Ca) MG TABS tablet Take by mouth.   Fexofenadine HCl (MUCINEX ALLERGY PO) Take by mouth as needed.   ketoconazole (NIZORAL) 2 % cream ketoconazole 2 % topical cream  APPLY TO THE AFFECTED AREA(S) BY TOPICAL ROUTE ONCE DAILY   levothyroxine (SYNTHROID) 75 MCG tablet TAKE 1 TABLET EVERY OTHER  DAY   mometasone (ELOCON) 0.1 % cream Apply topically.   Multiple Vitamin (MULTIVITAMIN WITH MINERALS) TABS tablet Take 1 tablet by mouth daily. Centrum Silver   Omega-3 Fatty Acids (FISH OIL CONCENTRATE PO) Take 1,200 mg by mouth daily.    omeprazole (PRILOSEC) 40 MG capsule TAKE 1 CAPSULE DAILY   potassium chloride (KLOR-CON 10) 10 MEQ tablet Take 1 tablet (10 mEq total) by mouth 2 (two) times daily.   Propylene Glycol (SYSTANE BALANCE OP) Place 1 drop into both eyes daily.    Respiratory Therapy Supplies (FLUTTER) DEVI Use as directed   rosuvastatin (CRESTOR) 10 MG tablet Take 1 tablet (10 mg total) by mouth daily.   VITAMIN D PO Take by mouth.     Objective:   VITALS:   Vitals:   12/01/23 0901  BP: 139/77  Pulse: 86  SpO2: 98%  Height: 5\' 4"  (1.626 m)    GEN:  The patient appears stated age and is in NAD. HEENT:  Normocephalic, atraumatic.  The mucous membranes are moist. The superficial temporal arteries are without ropiness or tenderness. CV:  RRR Lungs:  CTAB Neck/HEME:  There are no carotid bruits bilaterally.  Patient is undressed and placed into examining gown.  Chaperone is present (LCSW, Dois Davenport)  Neurological examination:  Orientation: The patient is alert and oriented x3.  Cranial nerves: There is good facial symmetry with L eye ptosis vs pseudoptosis. Ice test is done and there is ?slight improvement in degree of ptosis.  Extraocular muscles are intact. The visual fields are full to confrontational testing. The speech is fluent and clear but mild pseudobulbar.  It  is raspy in quality.  It is not particularly hypophonic.  Soft palate rises symmetrically and there is no tongue deviation. Hearing is intact to conversational tone. Sensation: Sensation is intact to light and pinprick throughout (facial, trunk, extremities).  There is no extinction with double simultaneous stimulation. There is no sensory dermatomal level identified. Motor: Strength is 5/5 in the bilateral upper and lower extremities.   She is able to squat and get back up.  Shoulder shrug is equal and symmetric.  There is no pronator drift.  There are no fasciculations noted, across arms, legs, chest, back, tongue. Deep tendon reflexes: Deep tendon reflexes are 3/4 at the bilateral biceps, triceps, brachioradialis, patella and achilles. Plantar responses are downgoing bilaterally.  Movement examination: Tone: There is nl tone in the bilateral  upper extremities.  The tone in the lower extremities is nl.  Abnormal movements: none Coordination:  There is no decremation with RAM's, with any form of RAMS, including alternating supination and pronation of the forearm, hand opening and closing, finger taps, heel taps and toe taps.  Gait and Station: The patient has no difficulty arising out of a deep-seated chair without the use of the hands. The patient's stride length is good.  She is able to ambulate in a tandem fashion. I have reviewed and interpreted the following labs independently   Chemistry      Component Value Date/Time   NA 142 11/12/2023 1131   K 3.3 (L) 11/12/2023 1131   CL 101 11/12/2023 1131   CO2 34 (H) 11/12/2023 1131   BUN 19 11/12/2023 1131   CREATININE 0.87 11/12/2023 1131      Component Value Date/Time   CALCIUM 9.7 11/12/2023 1131   ALKPHOS 50 01/30/2023 1438   AST 26 01/30/2023 1438   ALT 19 01/30/2023 1438   BILITOT 0.5 01/30/2023 1438      Lab Results  Component Value Date   TSH 3.87 08/14/2023   Lab Results  Component Value Date   WBC 5.5 11/12/2023   HGB  14.0 11/12/2023   HCT 41.8 11/12/2023   MCV 91.8 11/12/2023   PLT 210.0 11/12/2023     Total time spent on today's visit was 60 minutes, including both face-to-face time and nonface-to-face time.  Time included that spent on review of records (prior notes available to me/labs/imaging if pertinent), discussing treatment and goals, answering patient's questions and coordinating care.  Cc:  Etta Grandchild, MD

## 2023-12-01 ENCOUNTER — Encounter: Payer: Self-pay | Admitting: Neurology

## 2023-12-01 ENCOUNTER — Other Ambulatory Visit

## 2023-12-01 ENCOUNTER — Ambulatory Visit: Admitting: Neurology

## 2023-12-01 VITALS — BP 139/77 | HR 86 | Ht 64.0 in

## 2023-12-01 DIAGNOSIS — R1310 Dysphagia, unspecified: Secondary | ICD-10-CM

## 2023-12-01 DIAGNOSIS — H02402 Unspecified ptosis of left eyelid: Secondary | ICD-10-CM

## 2023-12-01 DIAGNOSIS — R292 Abnormal reflex: Secondary | ICD-10-CM | POA: Diagnosis not present

## 2023-12-01 DIAGNOSIS — R490 Dysphonia: Secondary | ICD-10-CM

## 2023-12-01 DIAGNOSIS — H02432 Paralytic ptosis of left eyelid: Secondary | ICD-10-CM | POA: Diagnosis not present

## 2023-12-01 NOTE — Addendum Note (Signed)
 Addended by: Dimas Chyle on: 12/01/2023 10:13 AM   Modules accepted: Orders

## 2023-12-08 LAB — MYASTHENIA GRAVIS PANEL 2
A CHR BINDING ABS: <0.3 nmol/L
ACHR Blocking Abs: <15 %{inhibition} (ref <15)
Acetylchol Modul Ab: <1 %{inhibition}

## 2023-12-09 ENCOUNTER — Ambulatory Visit: Payer: Medicare HMO | Admitting: Internal Medicine

## 2023-12-10 ENCOUNTER — Other Ambulatory Visit: Payer: Self-pay | Admitting: Internal Medicine

## 2023-12-10 DIAGNOSIS — E876 Hypokalemia: Secondary | ICD-10-CM

## 2023-12-18 ENCOUNTER — Telehealth: Payer: Self-pay | Admitting: Internal Medicine

## 2023-12-18 NOTE — Telephone Encounter (Signed)
 Copied from CRM [id number]. Topic: Clinical - Medication Question >> Dec 18, 2023  2:41 PM [place] E wrote: Reason for CRM: Patient states that she has been using the Symbicort inhaler and questioning if she can get switched over to Albuterol. Patient's son thinks that she is slowly losing her voice due to the Symbicort and wanted to try out a different inhaler. Callback number for patient is [phone] to confirm.

## 2023-12-19 ENCOUNTER — Other Ambulatory Visit: Payer: Self-pay | Admitting: Internal Medicine

## 2023-12-19 DIAGNOSIS — J4521 Mild intermittent asthma with (acute) exacerbation: Secondary | ICD-10-CM

## 2023-12-19 DIAGNOSIS — J4540 Moderate persistent asthma, uncomplicated: Secondary | ICD-10-CM

## 2023-12-19 MED ORDER — ALBUTEROL SULFATE HFA 108 (90 BASE) MCG/ACT IN AERS: 2.0000 | INHALATION_SPRAY | Freq: Four times a day (QID) | RESPIRATORY_TRACT | 3 refills | Status: DC | PRN

## 2023-12-19 NOTE — Telephone Encounter (Signed)
**Note De-identified  [place] Obfuscation** Please advise 

## 2023-12-25 ENCOUNTER — Telehealth: Payer: Self-pay | Admitting: Neurology

## 2023-12-25 NOTE — Telephone Encounter (Signed)
 Left message with the after hour service on 12-25-23   Caller is calling about the lab that was done and ins has decline

## 2023-12-25 NOTE — Telephone Encounter (Signed)
 Called husband and looking into this

## 2024-01-02 ENCOUNTER — Ambulatory Visit: Admitting: Internal Medicine

## 2024-01-02 ENCOUNTER — Encounter: Payer: Self-pay | Admitting: Internal Medicine

## 2024-01-02 VITALS — BP 132/80 | HR 75

## 2024-01-02 DIAGNOSIS — R0609 Other forms of dyspnea: Secondary | ICD-10-CM

## 2024-01-02 DIAGNOSIS — J4540 Moderate persistent asthma, uncomplicated: Secondary | ICD-10-CM | POA: Diagnosis not present

## 2024-01-02 DIAGNOSIS — J383 Other diseases of vocal cords: Secondary | ICD-10-CM

## 2024-01-02 DIAGNOSIS — R490 Dysphonia: Secondary | ICD-10-CM

## 2024-01-02 LAB — CBC WITH DIFFERENTIAL/PLATELET
Basophils Absolute: 0 10*3/uL (ref 0.0–0.1)
Basophils Relative: 0.9 % (ref 0.0–3.0)
Eosinophils Absolute: 0.5 10*3/uL (ref 0.0–0.7)
Eosinophils Relative: 8.4 % — ABNORMAL HIGH (ref 0.0–5.0)
HCT: 41.5 % (ref 36.0–46.0)
Hemoglobin: 13.9 g/dL (ref 12.0–15.0)
Lymphocytes Relative: 25.1 % (ref 12.0–46.0)
Lymphs Abs: 1.4 10*3/uL (ref 0.7–4.0)
MCHC: 33.6 g/dL (ref 30.0–36.0)
MCV: 92.7 fl (ref 78.0–100.0)
Monocytes Absolute: 0.5 10*3/uL (ref 0.1–1.0)
Monocytes Relative: 9.8 % (ref 3.0–12.0)
Neutro Abs: 3.1 10*3/uL (ref 1.4–7.7)
Neutrophils Relative %: 55.8 % (ref 43.0–77.0)
Platelets: 218 10*3/uL (ref 150.0–400.0)
RBC: 4.48 Mil/uL (ref 3.87–5.11)
RDW: 13.6 % (ref 11.5–15.5)
WBC: 5.6 10*3/uL (ref 4.0–10.5)

## 2024-01-02 NOTE — Patient Instructions (Addendum)
 ICD-10-CM   1. DOE (dyspnea on exertion)  R06.09     2. Asthma, moderate persistent, well-controlled  J45.40     3. 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 Dysphonia  R49.0       I believe the vocal cord issues are impacting her breathing..  In addition I believe that the inhaled steroids might be playing a role in causing vocal cord issues  Plan - Stop Symbicort  on a scheduled basis  - Take a prescription for Airsupra but if this is expensive then just use Symbicort  on a as needed basis -= Check blood work for CBC with differential, blood IgE and RAST allergy panel -Do pulmonary function test  -Will be interested to see what flow-volume loop abnormalities there - No need for CT chest (last dec 2024 with mild chronic scarring)   - Based on the results of these consider observation therapy versus Singulair versus Biologics for asthma  - Overall goal is to try to manage without inhaler steroid  Follow-up -rerturn to see DR Bertrum Brodie in 6 weeks fter completing above

## 2024-01-02 NOTE — Progress Notes (Signed)
 Subjective:    Patient ID: Tracey Boyd, female    DOB: 31-Oct-1950, 73 y.o.   MRN: [id number]  HPI OV 02/25/2011: followu for asthma evaluation/diagnosis. PFTs 02/25/2011: shows findings c/w moderate-severe obstruction with BD response though TLC and DLCO are slightly diminished. Fev1 is 1.3/55%, 40% BD resppnse. TLC 75%, DLCO 78%. She is now reporting daily nocturnal awakenings. Not using albuterol  for rescue. No other symptoms. States that Dr. Alva Jewels of [facility] told her she needs bubble study  Follow OV 03/18/2011  Pt returns feeling much better with decreased wheezing and dsypnea. DOE is decreased. Feels she can take in deep breath.  Spirometry today is much improved with FEV1 increased from 55% >>82%. No cough or wheezing   Ov 05/17/11: Followup asthma. SAw NP 2 months ago for followup on response to QVAR . Noted to have improved fev1 to normal but today she states it was done while she was using albuterol  at will along with QVAR  because she did not realize it was rescue medication. Since then not using albuterol . Feels well and greatly improved. No nocturnal awakenings. No cough. No chest tightness. Effort tolerance though improved compared to June is not as good as when she was using albuterol  a  Lot.   Social: preparing for her golf charity event Fam hx: no changes Past medical: No changes  Glad you are better  Continue qvar  2 puff twice daily  Use albuterol  only as needed  To assess true control, return at your convenience, to our office and have simple spirometry done  Please let me know once that spirometry is done so I can call you back with plan to continue or step up asthma treatment  Have flu shot today  Followup in 4-5 months or sooner iif needed  OV 03/16/2012 Followup moderate-mild persistent asthma.  This is 9 month followup. She was supposed to hv come 5 months ago but missed appt. PAst few days, unwell with viral URI symptoms and increased cough, chest tightness and wheeze  and increased albuterol  use and nocturnal awakenings. She insists she is compliant with QVAR  80mcg  2 puff twice daily. Spirometry - fev1:  1.5L/61%, Ratio 73 while better than baseline is worse than best. OVerall symptom severity is moderate and there no clear cut aggravating or relieving factors. Denies associate chest pain or sputum or edema  Past, Family, Social reviewed: no change since last visit  REC  ou have flare or attack of athma called asthma exacerbation Please take doxycycline  100mg  twice daily after meals x 5 days; avoid sunlight Please take methyl-prednisolone 24mg  once daily x 3 days, then 16mg  once daily x 3 days, then 8mg  once daily x 3 days, and stop   - any side effects please call immediately Please stop QVAR  Start advair 100/50 , 1 puff twice daily  - take sample and script REturn in 2 months with spirometry at return If you are not better or getting worse at any point call 911 or our  Number [phone] anytime    OV 05/18/2012  Followup Moderate PErsistent Atshma. Now on advair. Spiro 05/18/2012 - fev1 1.9L/77%, RAtio 74, small airways 55. T his is remarkably better. She feels baseline. Able to row. Reports baseline albuterol  use which is improved but still atleast 1 time per day. No nocturnal awakenings.   However, Hoarseness of voice persists. Unrelatd to advair. It is mild and chronic. Quality is of raspy voice. Unchanged over time. Unclear aggravating or relieving factors.  Past, Family, Social reviewed: odd 1 episode of fever 102 few weeks ago    Asthma  - glad is improved  - continue advair 1 puff twice daily at current dose  - have flu shot today 05/18/2012  Hoarseness of voice  - I do not think anymore this is related to active asthma  - I do not think that this is related to inhalers though these are causes for hoarseness - mainly because hoarseness persists despite change in inhalers and you have been on them not too long  - Please see ENT specialist at  [facility] for the same  Followup  - 6 mnths or sooner if needed     OV 12/07/2012 Followup moderate persistent asthma. Also with hoarseness of voice not otherwise specified. In terms of asthma she is now taking Qvar  because she has a long supply of this. She is using her albuterol  every other day which she sees as baseline and not as frequent use. Denies nocturnal awakenings or at rest or exacerbation of admissions or chest tightness or wheezing.  spirometry machine is not working today so I do not have an FEV1 on her  In terms of a hoarseness of voice: Saw Dr Donalee Fruits 07/24/2012 for hoarseness - felt to be GERD related, advised to avoid caffeine, etoh and Rx omperazole 2 weeks. She says during this exam he woke up extremely red. 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 She denies cough, ticklish feeling in the throat, gag. She is not interested in a referral to a gastroenterologist at this point  REC #Asthma  - Continue Qvar  2 puffs twice daily  - Please have flu shot in September 2014  - We will check spirometry at next visit because her machine is not working today  # Hoarseness of voice  - Try low glycemic diet and see if this helps acid reflux  - If you continue to have acid reflux and please see a gastroenterologist  #Followup  - 6 months, spirometry at followup  - Call sooner if there any problems   OV 04/19/2013 [place] Moderate PErsistent ASthma and hoarseness of voice NOS (presumed GERD, ? Mdi)  This is an acute visit. OVeral well with improved hoarseness but stil present. Past wweek increased cough, chest tighthenss, increased alb rescue use, some nocturnal awakening, increased hoarseness but no sputum or fever. Associatd sensation of headfullness present.  She is due to travel to [place] by road in a few days and wants to make sure everythiung is ok  Spirometry today fev1 1.5L/63%, Ratio  59 (baseline fev1 is around 2L while well RX)  Note: she says prednisone  causes her to have arm pain    Past, Family, Social reviewed: due to leave to [place] by road in a few days. [place] Noticed low BP at home; stopped norvasc  2.5mg  daily and bp better. With this "fogginess" sensation resolved. Of note, she is the grand-duaghter in Research scientist (medical) Dr Deidra Faster of the Plummer-Korinek syndrome.   OV 07/22/2013  Followup for moderate persistent asthma and hoarseness of voice not otherwise specified [presumed acid reflux versus inhalers versus speech/singing related]  In terms of asthma: She is feeling well. The Symbicort  is working well for her. In fact spirometry today shows FEV11.9 L/83%. FVC 2.7 L/80%. Ratio 74. This is her best ever spirometry and shows excellent control. Albuterol  use is only rare.  She will have her influenza shot today  In terms of hoarseness of voice: This is unchanged. She believes it is a little bit worse but to me feels the same. She is not interested in a gastroenterology workup at this time. She still frustrated that she's unable to sing in the church because of this. Have asked her to do research and try to see a voice specialist for the same   New problem: She's having some memory issues. She says that her husband has spotted her repeating things several times within a few minutes of each other. She does not want to see neurology. She wants her thyroid  function test rechecked. Last check of TSH was normal in 2011. She also wants a vitamin D  level checked. She thinks as these might be etiologies for memory issues. No problem with long-term memory  She wants to mover PMD to [place] [facility] due to logistics   OV 05/05/2014  Chief Complaint  Patient presents with   Follow-up    Pt states she is doing well. Pt states she has not needed to use the proventil  in 2 years. Pt denies cough, SOB, Cp/tightness.     Followup for moderate persistent asthma  and hoarseness of voice not otherwise specified [presumed acid reflux versus inhalers versus speech/singing related and being followed at [facility]]  In terms of asthma: She is feeling well. The Symbicort  is working well for her. In fact spirometry today shows FEV1 1.9 L/79%%. Ratio 79. This is her best ever spirometry and shows excellent control but then she is on short term prednisone  for nasal polyps under care of [facility] Dr Brent Cambric. So could be a prednisone  effect as well . Albuterol  use is only rare. D/w [employer] Dupulimab study with her. She is interested but does not qualify due to no Ae-asthma prior 12 months. She will have flu shot when available   Past, Family, Social reviewed: no change since last visit. Hoarse voice beter after [facility] visit   Followup for moderate persistent asthma and hoarseness of voice not otherwise specified [presumed acid reflux versus inhalers versus speech/singing related and being followed at [facility]] 01/23/2015 acute  ov/Wert re: cough on symbicort  80 one twice daily maint/ did not have saba Chief Complaint  Patient presents with   Acute Visit    Pt c/o prod cough with yellow sputum x 01/19/15. Has taken zithromax  and this did not help.   acute onset  Around 01/17/15 and progressively downhill since with severe cough / gagging but no vomiting  Zpak/ medrol  completed prilosec one hour before bfast as maint s overt hb on this  Not really sob unless coughing rec Whenever cough for whatever reason :  prilosec 40 mg Take 30-60 min    before your first and last meals of the day and Pepcid  20 mg one at bedtime  GERD diet  mucinex dm 1200  Mg every 12 hours and add tramadol  if can't stop coughing and use the flutter valve  Repeat medrol  dose pack  Call by 01/28/15 if not improving  Late add declined cxr agreed to return for one if not improving and prilosec should be Take 30- 60 min before your first and last meals of the day  OV 01/02/2024 -new consult because it has  been more than 3 years since she saw the practice.  Subjective:  Patient ID: Tracey Boyd, female , DOB: 1951/03/27 , age 23 y.o. , MRN: [id number] , ADDRESS: [address]  [place] [address] PCP Arcadio Knuckles, MD Patient Care Team: Arcadio Knuckles, MD as PCP - General (Internal Medicine) Merryl Abraham, MD as Consulting Physician (Obstetrics and Gynecology) Tat, Von Grumbling, DO as Consulting Physician (Neurology)  This Provider for this visit: Treatment Team:  Attending Provider: Maire Scot, MD    01/02/2024 -   Chief Complaint  Patient presents with   Pulmonary Consult    Re- est care. She c/o hoarseness and cough with yellow sputum.      HPI Tracey Boyd 73 y.o. -.  She presents with her husband.  He is independent historian.  They are frustrated by her respiratory symptoms.  I last saw her 10 years ago myself but in the interim 1 time she saw Dr. Vernestine Gondola.  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  When we tried to stop inhaled steroids she flared up.  It appears in the last 10 years she has continued her Symbicort  mostly compliant at 2 puffs 2 times daily but sometimes she will just take 1 puff a day.  It appears that the dysphonia has gotten worse.  She sees ENT at [facility] Dr. Sueellen Emery.  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  Some needle treatment was tried but it did not work.  She and her husband are frustrated by the ongoing dysphonia because she is unable to sing in church.  In addition is contributing to dyspnea on exertion.  The husband states when she comes back from a walk outdoors she is dyspneic.  Husband believes shortness of breath is being triggered by vocal cord issues but they also considerable neurologic issues because there are some mouth and tongue control issues.  Recently started back attached.  MRIs been done.  SCL: Receptor antibodies for myasthenia gravis are normal.  Chart  reviewed.  But they are also worried about lung issues.  December 2024 she had CT scan of the chest cardiac while there were no calcifications there is some mild reticular scarring in the inframammary area subpleurally.  Otherwise lung fields are largely clear.  Radiology is very reassured by the scarring in the lung fields.  Lab review shows that in the past her eosinophils have been high but she has not had allergy testing.     Dr Mitchel An Reflux Symptom Index (> 13-15 suggestive of LPR cough) 0 -> 5  =  none ->severe problem 01/02/2024   Hoarseness of problem with voice 4  Clearing  Of Throat 1  Excess throat mucus or feeling of post nasal drip 2  Difficulty swallowing food, liquid or tablets 2  Cough after eating or lying down 1  Breathing difficulties or choking episodes 2  Troublesome or annoying cough 1  Sensation of something sticking in throat or lump in throat 2  Heartburn, chest pain, indigestion, or stomach acid coming up 1  TOTAL 16       has a past medical history of Allergy, Anemia, Anxiety, Asthma, Dermatitis herpetiformis, GERD (gastroesophageal reflux disease), HTN (hypertension), Hypothyroidism, and Osteoporosis.   reports that she has never smoked. She has never been exposed to tobacco smoke. She has never used smokeless tobacco.  Past Surgical History:  Procedure Laterality Date   APPENDECTOMY     BIOPSY  11/01/2019   Procedure: BIOPSY;  Surgeon: Albertina Hugger, MD;  Location: [facility] ENDOSCOPY;  Service: Gastroenterology;;   Marianne Shirts [facility] STUDY N/A 11/01/2019   Procedure: Marianne Shirts  PH STUDY;  Surgeon: Albertina Hugger, MD;  Location: [facility];  Service: Gastroenterology;  Laterality: N/A;   COLONOSCOPY     CYSTECTOMY     ESOPHAGOGASTRODUODENOSCOPY (EGD) WITH PROPOFOL  N/A 11/01/2019   Procedure: ESOPHAGOGASTRODUODENOSCOPY (EGD) WITH PROPOFOL ;  Surgeon: Albertina Hugger, MD;  Location: [facility] ENDOSCOPY;  Service: Gastroenterology;  Laterality: N/A;   ORIF WRIST  FRACTURE Left 11/08/2016   Procedure: OPEN REDUCTION INTERNAL FIXATION (ORIF) LEFT WRIST FRACTURE;  Surgeon: Ronn Cohn, MD;  Location: [facility] OR;  Service: Orthopedics;  Laterality: Left;    Allergies  Allergen Reactions   Ace Inhibitors Cough    Reaction to lisinopril   Penicillins Hives    Has patient had a PCN reaction causing immediate rash, facial/tongue/throat swelling, SOB or lightheadedness with hypotension: Yes Has patient had a PCN reaction causing severe rash involving mucus membranes or skin necrosis: No Has patient had a PCN reaction that required hospitalization No Has patient had a PCN reaction occurring within the last 10 years: No If all of the above answers are "NO", then may proceed with Cephalosporin use.   Prednisone  Other (See Comments)    REACTION: paresthesias   Sulfa Antibiotics Hives   Tetracycline Other (See Comments)    Immunization History  Administered Date(s) Administered   Fluad Quad(high Dose 65+) 06/14/2019   Influenza Inj Mdck Quad Pf 07/04/2017   Influenza Split 07/10/2012   Influenza Whole 08/31/2010, 05/20/2011   Influenza, High Dose Seasonal PF 06/09/2018, 06/01/2022   Influenza,inj,Quad PF,6+ Mos 07/22/2013, 08/25/2014   Influenza-Unspecified 05/10/2021, 05/31/2022, 05/20/2023   Moderna SARS-COV2 Booster Vaccination 12/08/2020   PFIZER(Purple Top)SARS-COV-2 Vaccination 10/14/2019, 11/04/2019, 06/08/2020, 05/28/2021   Pfizer(Comirnaty)Fall Seasonal Vaccine 12 years and older 06/01/2022   Pneumococcal Conjugate-13 03/07/2016   Pneumococcal Polysaccharide-23 07/16/2017   Td 08/31/2010   Tdap 01/11/2021   Zoster Recombinant(Shingrix ) 01/11/2021, 03/27/2021   Zoster, Live 11/01/2011    Family History  Problem Relation Age of Onset   Multiple sclerosis Mother    Lung cancer Mother    Colon polyps Mother    Alcohol abuse Father    Multiple sclerosis Sister    Dementia Sister    Multiple sclerosis Paternal Aunt    Other Grandchild         special needs   Healthy Child    Colon cancer Neg Hx    Esophageal cancer Neg Hx    Rectal cancer Neg Hx    Stomach cancer Neg Hx    Crohn's disease Neg Hx    Ulcerative colitis Neg Hx      Current Outpatient Medications:    albuterol  (PROAIR  HFA) 108 (90 Base) MCG/ACT inhaler, Inhale 2 puffs into the lungs every 6 (six) hours as needed for wheezing or shortness of breath., Disp: 18 g, Rfl: 3   B Complex-C (SUPER B COMPLEX PO), Take 1 tablet by mouth daily., Disp: , Rfl:    budesonide -formoterol  (SYMBICORT ) 160-4.5 MCG/ACT inhaler, Inhale 2 puffs into the lungs 2 (two) times daily., Disp: , Rfl:    Calcium  Carb-Cholecalciferol (CALCIUM /VITAMIN D  PO), Take 1,200 mg by mouth daily. , Disp: , Rfl:    Fexofenadine HCl (MUCINEX ALLERGY PO), Take by mouth as needed., Disp: , Rfl:    ketoconazole (NIZORAL) 2 % cream, ketoconazole 2 % topical cream  APPLY TO THE AFFECTED AREA(S) BY TOPICAL ROUTE ONCE DAILY, Disp: , Rfl:    levothyroxine  (SYNTHROID ) 75 MCG tablet, TAKE 1 TABLET EVERY OTHER  DAY, Disp: 45 tablet, Rfl: 1  mometasone  (ELOCON ) 0.1 % cream, Apply topically., Disp: , Rfl:    Multiple Vitamin (MULTIVITAMIN WITH MINERALS) TABS tablet, Take 1 tablet by mouth daily. Centrum Silver, Disp: , Rfl:    Omega-3 Fatty Acids (FISH OIL CONCENTRATE PO), Take 1,200 mg by mouth daily. , Disp: , Rfl:    omeprazole  (PRILOSEC) 40 MG capsule, TAKE 1 CAPSULE DAILY, Disp: 90 capsule, Rfl: 1   potassium chloride  (KLOR-CON ) 10 MEQ tablet, TAKE 1 TABLET BY MOUTH 2 TIMES DAILY., Disp: 60 tablet, Rfl: 0   Propylene Glycol (SYSTANE BALANCE OP), Place 1 drop into both eyes daily. , Disp: , Rfl:    rosuvastatin  (CRESTOR ) 10 MG tablet, Take 1 tablet (10 mg total) by mouth daily., Disp: 90 tablet, Rfl: 1      Objective:   Vitals:   01/02/24 0957  BP: 132/80  Pulse: 75  SpO2: 95%    Estimated body mass index is 26.95 kg/m as calculated from the following:   Height as of 12/01/23: 5\' 4"  (1.626 m).    Weight as of 11/12/23: 157 lb (71.2 kg).  @WEIGHTCHANGE @  [employer]     Physical Exam   General: No distress. HOARSE O2 at rest: no Cane present: no Sitting in wheel chair: no Frail: no Obese: no Neuro: Alert and Oriented x 3. GCS 15. Speech normal Psych: Pleasant Resp:  Barrel Chest - no.  Wheeze - no, Crackles - no, No overt respiratory distress CVS: Normal heart sounds. Murmurs - n Ext: Stigmata of Connective Tissue Disease - oo HEENT: Normal upper airway. PEERL +. No post nasal drip        Assessment:       ICD-10-CM   1. DOE (dyspnea on exertion)  R06.09     2. Asthma, moderate persistent, well-controlled  J45.40     3. 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 Dysphonia  R49.0          Plan:     Patient Instructions     ICD-10-CM   1. DOE (dyspnea on exertion)  R06.09     2. Asthma, moderate persistent, well-controlled  J45.40     3. 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 Dysphonia  R49.0       I believe the vocal cord issues are impacting her breathing..  In addition I believe that the inhaled steroids might be playing a role in causing vocal cord issues  Plan - Stop Symbicort  on a scheduled basis  - Take a prescription for Airsupra but if this is expensive then just use Symbicort  on a as needed basis -= Check blood work for CBC with differential, blood IgE and RAST allergy panel -Do pulmonary function test  -Will be interested to see what flow-volume loop abnormalities there - No need for CT chest (last dec 2024 with mild chronic scarring)   - Based on the results of these consider observation therapy versus Singulair versus Biologics for asthma  - Overall goal is to try to manage without inhaler steroid  Follow-up -rerturn to see DR Bertrum Brodie in 6 weeks fter completing above    FOLLOWUP Return in about 6 weeks (around 02/13/2024) for 15 min visit, with Dr Bertrum Brodie after test.    [place]    Dr. Maire Scot, M.D., F.C.C.P,  Pulmonary  and Critical Care Medicine Staff Physician, [facility] Director - Interstitial Lung Disease  Program  Pulmonary Fibrosis [facility] at [facility],  [place], [id number]  Pager: [phone], If no answer or between  15:00h - 7:00h: call [phone] Telephone: [phone]  10:46 AM 01/02/2024

## 2024-01-02 NOTE — Addendum Note (Signed)
 Addended by: Maire Scot on: 01/02/2024 10:48 AM   Modules accepted: Orders

## 2024-01-06 ENCOUNTER — Encounter: Payer: Self-pay | Admitting: Internal Medicine

## 2024-01-07 NOTE — Telephone Encounter (Signed)
 Pt husband is checking in with the issue he is having. He has not heard from anyone regarding this issue from [facility] and the large bill he received.

## 2024-01-07 NOTE — Telephone Encounter (Signed)
 Sent bill to [employer]

## 2024-01-08 ENCOUNTER — Other Ambulatory Visit: Payer: Self-pay

## 2024-01-08 ENCOUNTER — Other Ambulatory Visit: Payer: Self-pay | Admitting: Internal Medicine

## 2024-01-08 DIAGNOSIS — T502X5A Adverse effect of carbonic-anhydrase inhibitors, benzothiadiazides and other diuretics, initial encounter: Secondary | ICD-10-CM

## 2024-01-08 LAB — ALLERGEN PROFILE, PERENNIAL ALLERGEN IGE

## 2024-01-08 MED ORDER — AIRSUPRA 90-80 MCG/ACT IN AERO: 2.0000 | INHALATION_SPRAY | Freq: Four times a day (QID) | RESPIRATORY_TRACT | 1 refills | Status: DC | PRN

## 2024-01-12 ENCOUNTER — Other Ambulatory Visit: Payer: Self-pay | Admitting: Internal Medicine

## 2024-01-12 DIAGNOSIS — E876 Hypokalemia: Secondary | ICD-10-CM

## 2024-01-12 MED ORDER — POTASSIUM CHLORIDE ER 10 MEQ PO TBCR: 10.0000 meq | EXTENDED_RELEASE_TABLET | Freq: Two times a day (BID) | ORAL | 0 refills | Status: DC

## 2024-01-12 NOTE — Telephone Encounter (Signed)
 Pt's husband called in and left a message. He is following up on the bill they received. Stated we were looking into it for them.

## 2024-01-13 NOTE — Telephone Encounter (Signed)
 Called patients husband and let him know I have been in touch with [facility] billing and they are adding DX codes and trying to refile the claim

## 2024-01-15 ENCOUNTER — Encounter (HOSPITAL_BASED_OUTPATIENT_CLINIC_OR_DEPARTMENT_OTHER): Payer: Self-pay

## 2024-01-19 ENCOUNTER — Encounter: Payer: Self-pay | Admitting: Internal Medicine

## 2024-01-19 DIAGNOSIS — R1312 Dysphagia, oropharyngeal phase: Secondary | ICD-10-CM | POA: Diagnosis not present

## 2024-01-19 DIAGNOSIS — R1313 Dysphagia, pharyngeal phase: Secondary | ICD-10-CM | POA: Insufficient documentation

## 2024-01-19 DIAGNOSIS — R490 Dysphonia: Secondary | ICD-10-CM | POA: Diagnosis not present

## 2024-01-19 DIAGNOSIS — R498 Other voice and resonance disorders: Secondary | ICD-10-CM | POA: Diagnosis not present

## 2024-01-19 DIAGNOSIS — J383 Other diseases of vocal cords: Secondary | ICD-10-CM | POA: Diagnosis not present

## 2024-01-19 DIAGNOSIS — R1310 Dysphagia, unspecified: Secondary | ICD-10-CM | POA: Diagnosis not present

## 2024-01-19 DIAGNOSIS — J3800 Paralysis of vocal cords and larynx, unspecified: Secondary | ICD-10-CM | POA: Insufficient documentation

## 2024-01-19 NOTE — Progress Notes (Signed)
 Blood eos high. Can discuss at followup. Can consider biologic for asthma

## 2024-01-29 DIAGNOSIS — G1221 Amyotrophic lateral sclerosis: Secondary | ICD-10-CM | POA: Diagnosis not present

## 2024-02-04 ENCOUNTER — Other Ambulatory Visit: Payer: Self-pay | Admitting: Internal Medicine

## 2024-02-04 DIAGNOSIS — E785 Hyperlipidemia, unspecified: Secondary | ICD-10-CM

## 2024-02-04 DIAGNOSIS — R471 Dysarthria and anarthria: Secondary | ICD-10-CM | POA: Diagnosis not present

## 2024-02-05 ENCOUNTER — Telehealth: Payer: Self-pay | Admitting: Neurology

## 2024-02-05 NOTE — Telephone Encounter (Signed)
 Patient husband wants to speak to [facility] about the [facility] labs that were denied by  the patient ins

## 2024-02-10 ENCOUNTER — Other Ambulatory Visit: Payer: Self-pay | Admitting: Internal Medicine

## 2024-02-10 DIAGNOSIS — T502X5A Adverse effect of carbonic-anhydrase inhibitors, benzothiadiazides and other diuretics, initial encounter: Secondary | ICD-10-CM

## 2024-02-17 DIAGNOSIS — L820 Inflamed seborrheic keratosis: Secondary | ICD-10-CM | POA: Diagnosis not present

## 2024-02-17 DIAGNOSIS — D692 Other nonthrombocytopenic purpura: Secondary | ICD-10-CM | POA: Diagnosis not present

## 2024-02-17 DIAGNOSIS — L304 Erythema intertrigo: Secondary | ICD-10-CM | POA: Diagnosis not present

## 2024-02-18 ENCOUNTER — Encounter: Payer: Self-pay | Admitting: Internal Medicine

## 2024-02-18 ENCOUNTER — Ambulatory Visit: Admitting: Internal Medicine

## 2024-02-18 VITALS — BP 130/86 | HR 90 | Temp 99.1°F | Ht 64.0 in

## 2024-02-18 DIAGNOSIS — E876 Hypokalemia: Secondary | ICD-10-CM | POA: Diagnosis not present

## 2024-02-18 DIAGNOSIS — I10 Essential (primary) hypertension: Secondary | ICD-10-CM

## 2024-02-18 DIAGNOSIS — J4540 Moderate persistent asthma, uncomplicated: Secondary | ICD-10-CM

## 2024-02-18 DIAGNOSIS — K219 Gastro-esophageal reflux disease without esophagitis: Secondary | ICD-10-CM

## 2024-02-18 DIAGNOSIS — Z1231 Encounter for screening mammogram for malignant neoplasm of breast: Secondary | ICD-10-CM | POA: Insufficient documentation

## 2024-02-18 DIAGNOSIS — T502X5A Adverse effect of carbonic-anhydrase inhibitors, benzothiadiazides and other diuretics, initial encounter: Secondary | ICD-10-CM | POA: Diagnosis not present

## 2024-02-18 DIAGNOSIS — E785 Hyperlipidemia, unspecified: Secondary | ICD-10-CM | POA: Diagnosis not present

## 2024-02-18 DIAGNOSIS — E039 Hypothyroidism, unspecified: Secondary | ICD-10-CM

## 2024-02-18 DIAGNOSIS — R739 Hyperglycemia, unspecified: Secondary | ICD-10-CM

## 2024-02-18 NOTE — Progress Notes (Signed)
 Subjective:  Patient ID: Tracey Boyd, female    DOB: [birth year]  Age: 73 y.o. MRN: [id number]  CC: Medical Management of Chronic Issues (3 month follow up. )   HPI Tracey Boyd presents for f/up ----  Discussed the use of AI scribe software for clinical note transcription with the patient, who gave verbal consent to proceed.  History of Present Illness   Tracey Boyd is a 73 year old female who presents with skin bruising and voice changes.  She has developed spots and bruises on her skin, which she attributes to sun exposure during her childhood in [place] . A recent visit to a dermatologist concluded that there was no treatment available for the spots and bruises. She is considering using makeup to cover the spots.  She experiences changes in her voice, describing it as a whisper and noting that she can no longer sing in the choir. Others have commented on her whispering voice. Despite these changes, her breathing is fine, and she can walk for miles without difficulty. She uses an inhaler but does not experience chest pain or shortness of breath during physical activity.  She mentions a recent procedure where a dermatologist froze a lesion on her hand, which has since become larger and painful. Additionally, she had a vascular lesion on her head that was treated by the dermatologist.  No chest pain or shortness of breath during physical activity.       Outpatient Medications Prior to Visit  Medication Sig Dispense Refill   Albuterol -Budesonide  (AIRSUPRA ) 90-80 MCG/ACT AERO Inhale 2 puffs into the lungs every 6 (six) hours as needed (Every 6-8 hours as needed). 10.7 g 1   B Complex-C (SUPER B COMPLEX PO) Take 1 tablet by mouth daily.     budesonide -formoterol  (SYMBICORT ) 160-4.5 MCG/ACT inhaler Inhale 2 puffs into the lungs 2 (two) times daily.     Calcium  Carb-Cholecalciferol (CALCIUM /VITAMIN D  PO) Take 1,200 mg by mouth daily.      Fexofenadine HCl (MUCINEX ALLERGY PO) Take  by mouth as needed.     ketoconazole (NIZORAL) 2 % cream ketoconazole 2 % topical cream  APPLY TO THE AFFECTED AREA(S) BY TOPICAL ROUTE ONCE DAILY     mometasone  (ELOCON ) 0.1 % cream Apply topically.     Multiple Vitamin (MULTIVITAMIN WITH MINERALS) TABS tablet Take 1 tablet by mouth daily. Centrum Silver     Omega-3 Fatty Acids (FISH OIL CONCENTRATE PO) Take 1,200 mg by mouth daily.      potassium chloride  (KLOR-CON ) 10 MEQ tablet TAKE 1 TABLET BY MOUTH 2 TIMES DAILY. 180 tablet 1   Propylene Glycol (SYSTANE BALANCE OP) Place 1 drop into both eyes daily.      rosuvastatin  (CRESTOR ) 10 MG tablet TAKE 1 TABLET BY MOUTH EVERY DAY 90 tablet 1   albuterol  (PROAIR  HFA) 108 (90 Base) MCG/ACT inhaler Inhale 2 puffs into the lungs every 6 (six) hours as needed for wheezing or shortness of breath. 18 g 3   levothyroxine  (SYNTHROID ) 75 MCG tablet TAKE 1 TABLET EVERY OTHER  DAY 45 tablet 1   omeprazole  (PRILOSEC) 40 MG capsule TAKE 1 CAPSULE DAILY 90 capsule 1   No facility-administered medications prior to visit.    ROS Review of Systems  Constitutional:  Negative for appetite change, chills, diaphoresis, fatigue and fever.  HENT:  Positive for voice change. Negative for sore throat and trouble swallowing.   Eyes: Negative.   Respiratory: Negative.  Negative for cough, chest tightness, shortness of breath and wheezing.  Cardiovascular:  Negative for chest pain, palpitations and leg swelling.  Gastrointestinal: Negative.  Negative for abdominal pain, constipation, diarrhea, nausea and vomiting.  Endocrine: Negative.   Genitourinary: Negative.  Negative for difficulty urinating.  Musculoskeletal: Negative.  Negative for arthralgias.  Skin: Negative.   Neurological: Negative.  Negative for dizziness and weakness.  Hematological:  Negative for adenopathy. Does not bruise/bleed easily.  Psychiatric/Behavioral: Negative.      Objective:  BP 130/86 (BP Location: Left Arm, Patient Position: Sitting,  Cuff Size: Normal)   Pulse 90   Temp 99.1 F (37.3 C) (Temporal)   Ht 5' 4 (1.626 m)   SpO2 95%   BMI 26.95 kg/m   BP Readings from Last 3 Encounters:  02/18/24 130/86  01/02/24 132/80  12/01/23 139/77    Wt Readings from Last 3 Encounters:  11/12/23 157 lb (71.2 kg)  11/12/23 157 lb (71.2 kg)  08/14/23 158 lb 9.6 oz (71.9 kg)    Physical Exam Vitals reviewed.  Constitutional:      Appearance: Normal appearance.  HENT:     Nose: Nose normal.     Mouth/Throat:     Mouth: Mucous membranes are moist.   Eyes:     General: No scleral icterus.    Conjunctiva/sclera: Conjunctivae normal.    Cardiovascular:     Rate and Rhythm: Normal rate and regular rhythm.     Heart sounds: No murmur heard.    No friction rub. No gallop.  Pulmonary:     Effort: Pulmonary effort is normal.     Breath sounds: No stridor. No wheezing, rhonchi or rales.  Abdominal:     General: Abdomen is flat.     Palpations: There is no mass.     Tenderness: There is no abdominal tenderness. There is no guarding.     Hernia: No hernia is present.   Musculoskeletal:        General: Normal range of motion.     Cervical back: Neck supple.     Right lower leg: No edema.     Left lower leg: No edema.  Lymphadenopathy:     Cervical: No cervical adenopathy.   Skin:    General: Skin is warm and dry.   Neurological:     General: No focal deficit present.     Mental Status: She is alert and oriented to person, place, and time. Mental status is at baseline.   Psychiatric:        Mood and Affect: Mood normal.        Behavior: Behavior normal.        Thought Content: Thought content normal.        Judgment: Judgment normal.     Lab Results  Component Value Date   WBC 5.6 01/02/2024   HGB 13.9 01/02/2024   HCT 41.5 01/02/2024   PLT 218.0 01/02/2024   GLUCOSE 89 02/18/2024   CHOL 156 02/18/2024   TRIG 103 02/18/2024   HDL 69 02/18/2024   LDLCALC 68 02/18/2024   ALT 23 02/18/2024   AST 30  02/18/2024   NA 144 02/18/2024   K 4.1 02/18/2024   CL 105 02/18/2024   CREATININE 0.87 02/18/2024   BUN 21 02/18/2024   CO2 27 02/18/2024   TSH 2.86 02/18/2024   INR 1.1 (H) 08/14/2023   HGBA1C 5.9 (H) 02/18/2024    MR Brain Wo Contrast Result Date: 11/14/2023 CLINICAL DATA:  73 year old female with headache and memory loss. EXAM: MRI HEAD WITHOUT CONTRAST TECHNIQUE:  Multiplanar, multiecho pulse sequences of the brain and surrounding structures were obtained without intravenous contrast. COMPARISON:  None Available. FINDINGS: Brain: No restricted diffusion to suggest acute infarction. No midline shift, mass effect, evidence of mass lesion, ventriculomegaly, extra-axial collection or acute intracranial hemorrhage. Cervicomedullary junction is within normal limits. Overall cerebral volume appears within normal limits for age, no convincing regional disproportionate brain atrophy. Martina Sledge and white matter signal is largely normal for age throughout the brain. No cortical encephalomalacia. There are occasional punctate chronic microhemorrhages in the brain (such left centrum semiovale on series 16, image 68). Deep gray nuclei, brainstem and cerebellum appear within normal limits. Diminutive pituitary gland with superimposed 3-4 mm central nodule (series 105, image 16), with no regional mass effect or invasion. Vascular: Major intracranial vascular flow voids are preserved, vertebrobasilar system appears to be diminutive probably on the basis of fetal type bilateral PCA origins (normal variation). Skull and upper cervical spine: Negative for age visible cervical spine. Visualized bone marrow signal is within normal limits. Sinuses/Orbits: Negative orbits. Scattered mild to moderate bilateral paranasal sinus mucosal thickening. Other: Mastoids are clear. Grossly normal visible internal auditory structures. Negative stylomastoid foramina, visible scalp and face. IMPRESSION: 1. No acute intracranial abnormality  and largely normal for age noncontrast MRI appearance of the Brain. 2. 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 No further imaging evaluation or follow-up is necessary. Correlate with history of pituitary hypersecretion. This follows ACR consensus guidelines: Management of Incidental Pituitary Findings on CT, MRI and F18-FDG PET: A White Paper of the ACR Incidental Findings Committee. J Am Coll Radiol 2018; 15: 966-72. 3. Mild to moderate paranasal sinus inflammation. Electronically Signed   By: Marlise Simpers M.D.   On: 11/14/2023 10:11    Assessment & Plan:   Acquired hypothyroidism- She is euthyroid. -     TSH; Future -     Levothyroxine  Sodium; Take 1 tablet (75 mcg total) by mouth every other day.  Dispense: 45 tablet; Refill: 1  Diuretic-induced hypokalemia -     Basic metabolic panel with GFR; Future  Primary hypertension -     Basic metabolic panel with GFR; Future -     Magnesium; Future  Hyperlipidemia with target LDL less than 130- LDL goal achieved. Doing well on the statin  -     Lipid panel; Future -     Hepatic function panel; Future  Chronic hyperglycemia -     Hemoglobin A1c; Future -     Basic metabolic panel with GFR; Future  Screening mammogram for breast cancer -     Digital Screening Mammogram, Left and Right; Future  Laryngopharyngeal reflux -     Omeprazole ; Take 1 capsule (40 mg total) by mouth daily.  Dispense: 90 capsule; Refill: 1     Follow-up: Return in about 6 months (around 08/19/2024).  Sandra Crouch, MD

## 2024-02-18 NOTE — Patient Instructions (Signed)
 Hypokalemia Hypokalemia means that the amount of potassium in the blood is lower than normal. Potassium is a mineral (electrolyte) that helps regulate the amount of fluid in the body. It also stimulates muscle tightening (contraction) and helps nerves work properly. Normally, most of the body's potassium is inside cells, and only a very small amount is in the blood. Because the amount in the blood is so small, minor changes to potassium levels in the blood can be life-threatening. What are the causes? This condition may be caused by: Antibiotic medicine. Diarrhea or vomiting. Taking too much of a medicine that helps you have a bowel movement (laxative) can cause diarrhea and lead to hypokalemia. Chronic kidney disease (CKD). Medicines that help the body get rid of excess fluid (diuretics). Eating disorders, such as anorexia or bulimia. Low magnesium levels in the body. Sweating a lot. What are the signs or symptoms? Symptoms of this condition include: Weakness. Constipation. Fatigue. Muscle cramps. Mental confusion. Skipped heartbeats or irregular heartbeat (palpitations). Tingling or numbness. How is this diagnosed? This condition is diagnosed with a blood test. How is this treated? This condition may be treated by: Taking potassium supplements. Adjusting the medicines that you take. Eating more foods that contain a lot of potassium. If your potassium level is very low, you may need to get potassium through an IV and be monitored in the hospital. Follow these instructions at home: Eating and drinking  Eat a healthy diet. A healthy diet includes fresh fruits and vegetables, whole grains, healthy fats, and lean proteins. If told, eat more foods that contain a lot of potassium. These include: Nuts, such as peanuts and pistachios. Seeds, such as sunflower seeds and pumpkin seeds. Peas, lentils, and lima beans. Whole grain and bran cereals and breads. Fresh fruits and vegetables,  such as apricots, avocado, bananas, cantaloupe, kiwi, oranges, tomatoes, asparagus, and potatoes. Juices, such as orange, tomato, and prune. Lean meats, including fish. Milk and milk products, such as yogurt. General instructions Take over-the-counter and prescription medicines only as told by your health care provider. This includes vitamins, natural food products, and supplements. Keep all follow-up visits. This is important. Contact a health care provider if: You have weakness that gets worse. You feel your heart pounding or racing. You vomit. You have diarrhea. You have diabetes and you have trouble keeping your blood sugar in your target range. Get help right away if: You have chest pain. You have shortness of breath. You have vomiting or diarrhea that lasts for more than 2 days. You faint. These symptoms may be an emergency. Get help right away. Call 911. Do not wait to see if the symptoms will go away. Do not drive yourself to the hospital. Summary Hypokalemia means that the amount of potassium in the blood is lower than normal. This condition is diagnosed with a blood test. Hypokalemia may be treated by taking potassium supplements, adjusting the medicines that you take, or eating more foods that are high in potassium. If your potassium level is very low, you may need to get potassium through an IV and be monitored in the hospital. This information is not intended to replace advice given to you by your health care provider. Make sure you discuss any questions you have with your health care provider. Document Revised: 05/10/2021 Document Reviewed: 05/10/2021 Elsevier Patient Education  2024 [employer].

## 2024-02-19 LAB — BASIC METABOLIC PANEL WITH GFR
BUN: 21 mg/dL (ref 7–25)
CO2: 27 mmol/L (ref 20–32)
Calcium: 9.9 mg/dL (ref 8.6–10.4)
Chloride: 105 mmol/L (ref 98–110)
Creat: 0.87 mg/dL (ref 0.60–1.00)
Glucose, Bld: 89 mg/dL (ref 65–99)
Potassium: 4.1 mmol/L (ref 3.5–5.3)
Sodium: 144 mmol/L (ref 135–146)
eGFR: 71 mL/min/{1.73_m2} (ref >=60)

## 2024-02-19 LAB — HEPATIC FUNCTION PANEL
AG Ratio: 1.7 (calc) (ref 1.0–2.5)
ALT: 23 U/L (ref 6–29)
AST: 30 U/L (ref 10–35)
Albumin: 4.8 g/dL (ref 3.6–5.1)
Alkaline phosphatase (APISO): 50 U/L (ref 37–153)
Bilirubin, Direct: 0.1 mg/dL (ref 0.0–0.2)
Globulin: 2.8 g/dL (ref 1.9–3.7)
Indirect Bilirubin: 0.5 mg/dL (ref 0.2–1.2)
Total Bilirubin: 0.6 mg/dL (ref 0.2–1.2)
Total Protein: 7.6 g/dL (ref 6.1–8.1)

## 2024-02-19 LAB — HEMOGLOBIN A1C
Hgb A1c MFr Bld: 5.9 % — ABNORMAL HIGH (ref <5.7)
Mean Plasma Glucose: 123 mg/dL
eAG (mmol/L): 6.8 mmol/L

## 2024-02-19 LAB — LIPID PANEL
Cholesterol: 156 mg/dL (ref <200)
HDL: 69 mg/dL (ref >=50)
LDL Cholesterol (Calc): 68 mg/dL
Non-HDL Cholesterol (Calc): 87 mg/dL (ref <130)
Total CHOL/HDL Ratio: 2.3 (calc) (ref <5.0)
Triglycerides: 103 mg/dL (ref <150)

## 2024-02-19 LAB — MAGNESIUM: Magnesium: 2.3 mg/dL (ref 1.5–2.5)

## 2024-02-19 LAB — TSH: TSH: 2.86 m[IU]/L (ref 0.40–4.50)

## 2024-02-19 MED ORDER — OMEPRAZOLE 40 MG PO CPDR: 40.0000 mg | DELAYED_RELEASE_CAPSULE | Freq: Every day | ORAL | 1 refills | Status: DC

## 2024-02-19 MED ORDER — LEVOTHYROXINE SODIUM 75 MCG PO TABS
75.0000 ug | ORAL_TABLET | ORAL | 1 refills | Status: DC
Start: 2024-02-19 — End: 2024-07-22

## 2024-02-22 ENCOUNTER — Ambulatory Visit: Payer: Self-pay | Admitting: Internal Medicine

## 2024-02-23 ENCOUNTER — Ambulatory Visit: Admitting: Internal Medicine

## 2024-02-23 ENCOUNTER — Encounter (HOSPITAL_BASED_OUTPATIENT_CLINIC_OR_DEPARTMENT_OTHER)

## 2024-02-24 ENCOUNTER — Ambulatory Visit: Admitting: Internal Medicine

## 2024-02-24 ENCOUNTER — Encounter: Payer: Self-pay | Admitting: Internal Medicine

## 2024-02-24 VITALS — BP 84/54 | HR 75 | Ht 64.0 in | Wt 153.0 lb

## 2024-02-24 DIAGNOSIS — J4540 Moderate persistent asthma, uncomplicated: Secondary | ICD-10-CM | POA: Diagnosis not present

## 2024-02-24 DIAGNOSIS — R490 Dysphonia: Secondary | ICD-10-CM

## 2024-02-24 DIAGNOSIS — R0609 Other forms of dyspnea: Secondary | ICD-10-CM

## 2024-02-24 DIAGNOSIS — D721 Eosinophilia, unspecified: Secondary | ICD-10-CM

## 2024-02-24 DIAGNOSIS — J383 Other diseases of vocal cords: Secondary | ICD-10-CM

## 2024-02-24 LAB — PULMONARY FUNCTION TEST
DL/VA % pred: 102 %
DL/VA: 4.22 ml/min/mmHg/L
DLCO cor % pred: 80 %
DLCO cor: 15.65 ml/min/mmHg
DLCO unc % pred: 80 %
DLCO unc: 15.65 ml/min/mmHg
FEF 25-75 Post: 1.73 L/s
FEF 25-75 Pre: 1 L/s
FEF2575-%Change-Post: 72 %
FEF2575-%Pred-Post: 95 %
FEF2575-%Pred-Pre: 55 %
FEV1-%Change-Post: 16 %
FEV1-%Pred-Post: 72 %
FEV1-%Pred-Pre: 62 %
FEV1-Post: 1.6 L
FEV1-Pre: 1.37 L
FEV1FVC-%Change-Post: 4 %
FEV1FVC-%Pred-Pre: 96 %
FEV6-%Change-Post: 11 %
FEV6-%Pred-Post: 75 %
FEV6-%Pred-Pre: 67 %
FEV6-Post: 2.11 L
FEV6-Pre: 1.89 L
FEV6FVC-%Change-Post: 0 %
FEV6FVC-%Pred-Post: 104 %
FEV6FVC-%Pred-Pre: 104 %
FVC-%Change-Post: 11 %
FVC-%Pred-Post: 72 %
FVC-%Pred-Pre: 64 %
FVC-Post: 2.12 L
FVC-Pre: 1.89 L
Post FEV1/FVC ratio: 76 %
Post FEV6/FVC ratio: 100 %
Pre FEV1/FVC ratio: 73 %
Pre FEV6/FVC Ratio: 100 %
RV % pred: 97 %
RV: 2.17 L
TLC % pred: 85 %
TLC: 4.31 L

## 2024-02-24 NOTE — Progress Notes (Signed)
 Subjective:    Patient ID: Tracey Boyd, female    DOB: May 20, 1951, 73 y.o.   MRN: [id number]  HPI OV 02/25/2011: followu for asthma evaluation/diagnosis. PFTs 02/25/2011: shows findings c/w moderate-severe obstruction with BD response though TLC and DLCO are slightly diminished. Fev1 is 1.3/55%, 40% BD resppnse. TLC 75%, DLCO 78%. She is now reporting daily nocturnal awakenings. Not using albuterol  for rescue. No other symptoms. States that Dr. Alva Jewels of [facility] told her she needs bubble study  Follow OV 03/18/2011  Pt returns feeling much better with decreased wheezing and dsypnea. DOE is decreased. Feels she can take in deep breath.  Spirometry today is much improved with FEV1 increased from 55% >>82%. No cough or wheezing   Ov 05/17/11: Followup asthma. SAw NP 2 months ago for followup on response to QVAR . Noted to have improved fev1 to normal but today she states it was done while she was using albuterol  at will along with QVAR  because she did not realize it was rescue medication. Since then not using albuterol . Feels well and greatly improved. No nocturnal awakenings. No cough. No chest tightness. Effort tolerance though improved compared to June is not as good as when she was using albuterol  a  Lot.   Social: preparing for her golf charity event Fam hx: no changes Past medical: No changes  Glad you are better  Continue qvar  2 puff twice daily  Use albuterol  only as needed  To assess true control, return at your convenience, to our office and have simple spirometry done  Please let me know once that spirometry is done so I can call you back with plan to continue or step up asthma treatment  Have flu shot today  Followup in 4-5 months or sooner iif needed  OV 03/16/2012 Followup moderate-mild persistent asthma.  This is 9 month followup. She was supposed to hv come 5 months ago but missed appt. PAst few days, unwell with viral URI symptoms and increased cough, chest tightness and  wheeze and increased albuterol  use and nocturnal awakenings. She insists she is compliant with QVAR  80mcg  2 puff twice daily. Spirometry - fev1:  1.5L/61%, Ratio 73 while better than baseline is worse than best. OVerall symptom severity is moderate and there no clear cut aggravating or relieving factors. Denies associate chest pain or sputum or edema  Past, Family, Social reviewed: no change since last visit  REC  ou have flare or attack of athma called asthma exacerbation Please take doxycycline  100mg  twice daily after meals x 5 days; avoid sunlight Please take methyl-prednisolone 24mg  once daily x 3 days, then 16mg  once daily x 3 days, then 8mg  once daily x 3 days, and stop   - any side effects please call immediately Please stop QVAR  Start advair 100/50 , 1 puff twice daily  - take sample and script REturn in 2 months with spirometry at return If you are not better or getting worse at any point call 911 or our  Number [phone] anytime    OV 05/18/2012  Followup Moderate PErsistent Atshma. Now on advair. Spiro 05/18/2012 - fev1 1.9L/77%, RAtio 74, small airways 55. T his is remarkably better. She feels baseline. Able to row. Reports baseline albuterol  use which is improved but still atleast 1 time per day. No nocturnal awakenings.   However, Hoarseness of voice persists. Unrelatd to advair. It is mild and chronic. Quality is of raspy voice. Unchanged over time. Unclear aggravating or relieving factors.  Past, Family, Social reviewed: odd 1 episode of fever 102 few weeks ago    Asthma  - glad is improved  - continue advair 1 puff twice daily at current dose  - have flu shot today 05/18/2012  Hoarseness of voice  - I do not think anymore this is related to active asthma  - I do not think that this is related to inhalers though these are causes for hoarseness - mainly because hoarseness persists despite change in inhalers and you have been on them not too long  - Please see ENT  specialist at [facility] for the same  Followup  - 6 mnths or sooner if needed     OV 12/07/2012 Followup moderate persistent asthma. Also with hoarseness of voice not otherwise specified. In terms of asthma she is now taking Qvar  because she has a long supply of this. She is using her albuterol  every other day which she sees as baseline and not as frequent use. Denies nocturnal awakenings or at rest or exacerbation of admissions or chest tightness or wheezing.  spirometry machine is not working today so I do not have an FEV1 on her  In terms of a hoarseness of voice: Saw Dr Donalee Fruits 07/24/2012 for hoarseness - felt to be GERD related, advised to avoid caffeine, etoh and Rx omperazole 2 weeks. She says during this exam he woke up extremely red. 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 She denies cough, ticklish feeling in the throat, gag. She is not interested in a referral to a gastroenterologist at this point  REC #Asthma  - Continue Qvar  2 puffs twice daily  - Please have flu shot in September 2014  - We will check spirometry at next visit because her machine is not working today  # Hoarseness of voice  - Try low glycemic diet and see if this helps acid reflux  - If you continue to have acid reflux and please see a gastroenterologist  #Followup  - 6 months, spirometry at followup  - Call sooner if there any problems   OV 04/19/2013 [place] Moderate PErsistent ASthma and hoarseness of voice NOS (presumed GERD, ? Mdi)  This is an acute visit. OVeral well with improved hoarseness but stil present. Past wweek increased cough, chest tighthenss, increased alb rescue use, some nocturnal awakening, increased hoarseness but no sputum or fever. Associatd sensation of headfullness present.  She is due to travel to [place] by road in a few days and wants to make sure everythiung is ok  Spirometry today fev1  1.5L/63%, Ratio 59 (baseline fev1 is around 2L while well RX)  Note: she says prednisone  causes her to have arm pain    Past, Family, Social reviewed: due to leave to [place] by road in a few days. [place]. Noticed low BP at home; stopped norvasc  2.5mg  daily and bp better. With this fogginess sensation resolved. Of note, she is the grand-duaghter in Research scientist (medical) Dr Deidra Faster of the Plummer-Bures syndrome.   OV 07/22/2013  Followup for moderate persistent asthma and hoarseness of voice not otherwise specified [presumed acid reflux versus inhalers versus speech/singing related]  In terms of asthma: She is feeling well. The Symbicort  is working well for her. In fact spirometry today shows FEV11.9 L/83%. FVC 2.7 L/80%. Ratio 74. This is her best ever spirometry and shows excellent control. Albuterol  use is only rare.  She will have her influenza shot today  In terms of hoarseness of voice: This is unchanged. She believes it is a little bit worse but to me feels the same. She is not interested in a gastroenterology workup at this time. She still frustrated that she's unable to sing in the church because of this. Have asked her to do research and try to see a voice specialist for the same   New problem: She's having some memory issues. She says that her husband has spotted her repeating things several times within a few minutes of each other. She does not want to see neurology. She wants her thyroid  function test rechecked. Last check of TSH was normal in 2011. She also wants a vitamin D  level checked. She thinks as these might be etiologies for memory issues. No problem with long-term memory  She wants to mover PMD to [place] at [facility] due to logistics   OV 05/05/2014  Chief Complaint  Patient presents with   Follow-up    Pt states she is doing well. Pt states she has not needed to use the proventil  in 2 years. Pt denies cough, SOB, Cp/tightness.     Followup for moderate  persistent asthma and hoarseness of voice not otherwise specified [presumed acid reflux versus inhalers versus speech/singing related and being followed at [facility]]  In terms of asthma: She is feeling well. The Symbicort  is working well for her. In fact spirometry today shows FEV1 1.9 L/79%%. Ratio 79. This is her best ever spirometry and shows excellent control but then she is on short term prednisone  for nasal polyps under care of [facility] Dr Brent Cambric. So could be a prednisone  effect as well . Albuterol  use is only rare. D/w [employer] Dupulimab study with her. She is interested but does not qualify due to no Ae-asthma prior 12 months. She will have flu shot when available   Past, Family, Social reviewed: no change since last visit. Hoarse voice beter after [facility] visit   Followup for moderate persistent asthma and hoarseness of voice not otherwise specified [presumed acid reflux versus inhalers versus speech/singing related and being followed at [facility]] 01/23/2015 acute  ov/Wert re: cough on symbicort  80 one twice daily maint/ did not have saba Chief Complaint  Patient presents with   Acute Visit    Pt c/o prod cough with yellow sputum x 01/19/15. Has taken zithromax  and this did not help.   acute onset  Around 01/17/15 and progressively downhill since with severe cough / gagging but no vomiting  Zpak/ medrol  completed prilosec one hour before bfast as maint s overt hb on this  Not really sob unless coughing rec Whenever cough for whatever reason :  prilosec 40 mg Take 30-60 min    before your first and last meals of the day and Pepcid  20 mg one at bedtime  GERD diet  mucinex dm 1200  Mg every 12 hours and add tramadol  if can't stop coughing and use the flutter valve  Repeat medrol  dose pack  Call by 01/28/15 if not improving  Late add declined cxr agreed to return for one if not improving and prilosec should be Take 30- 60 min before your first and last meals of the day  OV 01/02/2024 -new  consult because it has been more than 3 years since she saw the practice.  Subjective:  Patient ID: Tracey Boyd, female , DOB: 1951-03-06 , age 68 y.o. , MRN: [id number] , ADDRESS: [address]  [place] [address] PCP Arcadio Knuckles, MD Patient Care Team: Arcadio Knuckles, MD as PCP - General (Internal Medicine) Merryl Abraham, MD as Consulting Physician (Obstetrics and Gynecology) Tat, Von Grumbling, DO as Consulting Physician (Neurology)  This Provider for this visit: Treatment Team:  Attending Provider: Maire Scot, MD    01/02/2024 -   Chief Complaint  Patient presents with   Pulmonary Consult    Re- est care. She c/o hoarseness and cough with yellow sputum.      HPI Raylee Strehl 73 y.o. -.  She presents with her husband.  He is independent historian.  They are frustrated by her respiratory symptoms.  I last saw her 10 years ago myself but in the interim 1 time she saw Dr. Vernestine Gondola.  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  When we tried to stop inhaled steroids she flared up.  It appears in the last 10 years she has continued her Symbicort  mostly compliant at 2 puffs 2 times daily but sometimes she will just take 1 puff a day.  It appears that the dysphonia has gotten worse.  She sees ENT at [facility] Dr. Sueellen Emery.  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  Some needle treatment was tried but it did not work.  She and her husband are frustrated by the ongoing dysphonia because she is unable to sing in church.  In addition is contributing to dyspnea on exertion.  The husband states when she comes back from a walk outdoors she is dyspneic.  Husband believes shortness of breath is being triggered by vocal cord issues but they also considerable neurologic issues because there are some mouth and tongue control issues.  Recently started back attached.  MRIs been done.  SCL: Receptor antibodies for myasthenia gravis are  normal.  Chart reviewed.  But they are also worried about lung issues.  December 2024 she had CT scan of the chest cardiac while there were no calcifications there is some mild reticular scarring in the inframammary area subpleurally.  Otherwise lung fields are largely clear.  Radiology is very reassured by the scarring in the lung fields.  Lab review shows that in the past her eosinophils have been high but she has not had allergy testing.      OV 02/24/2024  Subjective:  Patient ID: Tracey Boyd, female , DOB: Jan 03, 1951 , age 67 y.o. , MRN: [id number] , AD[address]ESS: [address] [place] [address] PCP Arcadio Knuckles, MD Patient Care Team: Arcadio Knuckles, MD as PCP - General (Internal Medicine) Merryl Abraham, MD as Consulting Physician (Obstetrics and Gynecology) Tat, Von Grumbling, DO as Consulting Physician (Neurology)  This Provider for this visit: Treatment Team:  Attending Provider: Maire Scot, MD    02/24/2024 -   Chief Complaint  Patient presents with   Follow-up    PFT done today. Her voice quality is better on the airsupra , but her breathing  is slightly worse. She uses airsupra  about once per day. She states she never rinsed her mouth when she used the symbicort .      HPI Sha Burling 73 y.o. -returns for follow-up with her husband.  Since the last visit she is not taking Airsupra .  She is taking it at least once a day.  She is most frustrated with her voice quality and ability to sing choir.  She is also frustrated that it takes a lot of effort to  speak.  Otherwise no interim new medical problems.  No ER visits no surgeries no changes to medications since the last visit.  Of note her husband was involved in an MVA when his truck rolled over after being rear-ended this was yesterday.  He is lucky to be alive he states.  And they both are grateful the fact he is alive.  She did have pulmonary function test that shows obstruction with significant airway  hyperresponsiveness.  The flow volume loops appear normal but as the more effort she gives it starts vibrating showing evidence of vibration of the vocal cords.  Blood test show eosinophilia of 100 cells per cubic millimeter but normal allergy profile.  Basically she has E-asthma.     Dr Mitchel An Reflux Symptom Index (> 13-15 suggestive of LPR cough) 0 -> 5  =  none ->severe problem 01/02/2024   Hoarseness of problem with voice 4  Clearing  Of Throat 1  Excess throat mucus or feeling of post nasal drip 2  Difficulty swallowing food, liquid or tablets 2  Cough after eating or lying down 1  Breathing difficulties or choking episodes 2  Troublesome or annoying cough 1  Sensation of something sticking in throat or lump in throat 2  Heartburn, chest pain, indigestion, or stomach acid coming up 1  TOTAL 16    Latest Reference Range & Units 10/23/07 08:13 08/06/10 08:02 08/25/14 15:42 03/07/16 10:45 07/16/17 14:11 07/07/19 14:21 01/11/21 10:52 03/19/22 11:33 01/30/23 14:38 08/14/23 10:36 11/12/23 11:31 01/02/24 10:48  Eosinophils Absolute 0.0 - 0.7 K/uL 0.6 0.6 0.5 0.6 0.6 0.6 0.4 0.3 0.4 0.3 0.1 0.5    Latest Reference Range & Units 01/02/24 10:48  Class Description Allergens  Comment  D Pteronyssinus IgE Class 0 kU/L <0.10  D Farinae IgE Class 0 kU/L <0.10  Cat Dander IgE Class 0 kU/L <0.10  Dog Dander IgE Class 0 kU/L <0.10  Penicillium Chrysogen IgE Class 0 kU/L <0.10  Cladosporium Herbarum IgE Class 0 kU/L <0.10  Aspergillus Fumigatus IgE Class 0 kU/L <0.10  Mucor Racemosus IgE Class 0 kU/L <0.10  Alternaria Alternata IgE Class 0 kU/L <0.10  Stemphylium Herbarum IgE Class 0 kU/L <0.10  Goose Feathers IgE Class 0 kU/L <0.10  Chicken Feathers IgE Class 0 kU/L <0.10  Duck Feathers IgE Class 0 kU/L <0.10  Mouse Urine IgE Class 0 kU/L <0.10   PFT     Latest Ref Rng & Units 02/24/2024    1:43 PM  PFT Results  FVC-Pre L 1.89  P  FVC-Predicted Pre % 64  P  FVC-Post L 2.12  P   FVC-Predicted Post % 72  P  Pre FEV1/FVC % % 73  P  Post FEV1/FCV % % 76  P  FEV1-Pre L 1.37  P  FEV1-Predicted Pre % 62  P  FEV1-Post L 1.60  P  DLCO uncorrected ml/min/mmHg 15.65  P  DLCO UNC% % 80  P  DLCO corrected ml/min/mmHg 15.65  P  DLCO COR %Predicted % 80  P  DLVA Predicted % 102  P  TLC L 4.31  P  TLC % Predicted % 85  P  RV % Predicted % 97  P    P Preliminary result       LAB RESULTS last 96 hours No results found.       has a past medical history of Allergy, Anemia, Anxiety, Asthma, Dermatitis herpetiformis, GERD (gastroesophageal reflux disease), HTN (hypertension), Hypothyroidism, and Osteoporosis.   reports that she  has never smoked. She has never been exposed to tobacco smoke. She has never used smokeless tobacco.  Past Surgical History:  Procedure Laterality Date   APPENDECTOMY     BIOPSY  11/01/2019   Procedure: BIOPSY;  Surgeon: Albertina Hugger, MD;  Location: [facility];  Service: Gastroenterology;;   Marianne Shirts [facility] STUDY N/A 11/01/2019   Procedure: Marianne Shirts PH STUDY;  Surgeon: Albertina Hugger, MD;  Location: [facility] ENDOSCOPY;  Service: Gastroenterology;  Laterality: N/A;   COLONOSCOPY     CYSTECTOMY     ESOPHAGOGASTRODUODENOSCOPY (EGD) WITH PROPOFOL  N/A 11/01/2019   Procedure: ESOPHAGOGASTRODUODENOSCOPY (EGD) WITH PROPOFOL ;  Surgeon: Albertina Hugger, MD;  Location: [facility] ENDOSCOPY;  Service: Gastroenterology;  Laterality: N/A;   ORIF WRIST FRACTURE Left 11/08/2016   Procedure: OPEN REDUCTION INTERNAL FIXATION (ORIF) LEFT WRIST FRACTURE;  Surgeon: Ronn Cohn, MD;  Location: [facility] OR;  Service: Orthopedics;  Laterality: Left;    Allergies  Allergen Reactions   Ace Inhibitors Cough    Reaction to lisinopril   Penicillins Hives    Has patient had a PCN reaction causing immediate rash, facial/tongue/throat swelling, SOB or lightheadedness with hypotension: Yes Has patient had a PCN reaction causing severe rash involving mucus membranes or skin  necrosis: No Has patient had a PCN reaction that required hospitalization No Has patient had a PCN reaction occurring within the last 10 years: No If all of the above answers are NO, then may proceed with Cephalosporin use.   Prednisone  Other (See Comments)    REACTION: paresthesias   Sulfa Antibiotics Hives   Tetracycline Other (See Comments)    Immunization History  Administered Date(s) Administered   Fluad Quad(high Dose 65+) 06/14/2019   Influenza Inj Mdck Quad Pf 07/04/2017   Influenza Split 07/10/2012   Influenza Whole 08/31/2010, 05/20/2011   Influenza, High Dose Seasonal PF 06/09/2018, 06/01/2022   Influenza,inj,Quad PF,6+ Mos 07/22/2013, 08/25/2014   Influenza-Unspecified 05/10/2021, 05/31/2022, 05/20/2023   Moderna SARS-COV2 Booster Vaccination 12/08/2020   PFIZER(Purple Top)SARS-COV-2 Vaccination 10/14/2019, 11/04/2019, 06/08/2020, 05/28/2021   Pfizer(Comirnaty)Fall Seasonal Vaccine 12 years and older 06/01/2022   Pneumococcal Conjugate-13 03/07/2016   Pneumococcal Polysaccharide-23 07/16/2017   Td 08/31/2010   Tdap 01/11/2021   Zoster Recombinant(Shingrix ) 01/11/2021, 03/27/2021   Zoster, Live 11/01/2011    Family History  Problem Relation Age of Onset   Multiple sclerosis Mother    Lung cancer Mother    Colon polyps Mother    Alcohol abuse Father    Multiple sclerosis Sister    Dementia Sister    Multiple sclerosis Paternal Aunt    Other Grandchild        special needs   Healthy Child    Colon cancer Neg Hx    Esophageal cancer Neg Hx    Rectal cancer Neg Hx    Stomach cancer Neg Hx    Crohn's disease Neg Hx    Ulcerative colitis Neg Hx      Current Outpatient Medications:    Albuterol -Budesonide  (AIRSUPRA ) 90-80 MCG/ACT AERO, Inhale 2 puffs into the lungs every 6 (six) hours as needed (Every 6-8 hours as needed)., Disp: 10.7 g, Rfl: 1   B Complex-C (SUPER B COMPLEX PO), Take 1 tablet by mouth daily., Disp: , Rfl:    Calcium  Carb-Cholecalciferol  (CALCIUM /VITAMIN D  PO), Take 1,200 mg by mouth daily. , Disp: , Rfl:    Fexofenadine HCl (MUCINEX ALLERGY PO), Take by mouth as needed., Disp: , Rfl:    ketoconazole (NIZORAL) 2 % cream, ketoconazole 2 %  topical cream  APPLY TO THE AFFECTED AREA(S) BY TOPICAL ROUTE ONCE DAILY, Disp: , Rfl:    levothyroxine  (SYNTHROID ) 75 MCG tablet, Take 1 tablet (75 mcg total) by mouth every other day., Disp: 45 tablet, Rfl: 1   mometasone  (ELOCON ) 0.1 % cream, Apply topically., Disp: , Rfl:    Multiple Vitamin (MULTIVITAMIN WITH MINERALS) TABS tablet, Take 1 tablet by mouth daily. Centrum Silver, Disp: , Rfl:    Omega-3 Fatty Acids (FISH OIL CONCENTRATE PO), Take 1,200 mg by mouth daily. , Disp: , Rfl:    omeprazole  (PRILOSEC) 40 MG capsule, Take 1 capsule (40 mg total) by mouth daily., Disp: 90 capsule, Rfl: 1   potassium chloride  (KLOR-CON ) 10 MEQ tablet, TAKE 1 TABLET BY MOUTH 2 TIMES DAILY., Disp: 180 tablet, Rfl: 1   Propylene Glycol (SYSTANE BALANCE OP), Place 1 drop into both eyes daily. , Disp: , Rfl:    rosuvastatin  (CRESTOR ) 10 MG tablet, TAKE 1 TABLET BY MOUTH EVERY DAY, Disp: 90 tablet, Rfl: 1   budesonide -formoterol  (SYMBICORT ) 160-4.5 MCG/ACT inhaler, Inhale 2 puffs into the lungs 2 (two) times daily., Disp: , Rfl:       Objective:   Vitals:   02/24/24 1447 02/24/24 1448 02/24/24 1450  BP:  94/60 (!) 84/54  Pulse: 75    SpO2: 98%    Weight:  153 lb (69.4 kg)   Height:  5' 4 (1.626 m)     Estimated body mass index is 26.26 kg/m as calculated from the following:   Height as of this encounter: 5' 4 (1.626 m).   Weight as of this encounter: 153 lb (69.4 kg).  @WEIGHTCHANGE @  [employer]   02/24/24 1448  Weight: 153 lb (69.4 kg)     Physical Exam   General: No distress. Looks well O2 at rest: no Cane present: no Sitting in wheel chair: non Frail: NO Obese: no Neuro: Alert and Oriented x 3. GCS 15. Speech normal Psych: Pleasant Resp:  Barrel Chest - no.  Wheeze - no,  Crackles - no , No overt respiratory distress CVS: Normal heart sounds. Murmurs - no Ext: Stigmata of Connective Tissue Disease - no HEENT: Normal upper airway. PEERL +. No post nasal drip. HOARSE VOICE        Assessment:       ICD-10-CM   1. Asthma, moderate persistent, poorly-controlled  J45.40     2. 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 Dysphonia  R49.0          Plan:     Patient Instructions     ICD-10-CM   1. Asthma, moderate persistent, poorly-controlled  J45.40     2. 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 Dysphonia  R49.0        Pulmonary function test shows moderate obstruction with significant airway hyperresponsiveness Blood test shows eosinophilia Complaint was asthma is not fully well-controlled In addition years of inhaled steroid have caused damage to vocal cords and caused vocal cord atrophy and dysphonia  - Pulmonary function test shows vibrations of the vocal cord that happen as you repeated times    Plan - Continue Airsupra  as needed [noted that he taking it 1 time daily] - START FASENRA for eosinophilic asthma - Suspect the vocal cords may take at least 1 year to improve or might never improve  Follow-up -rerturn  to see nurse practitioner in 2 months for follow-up and progress   FOLLOWUP Return in about 9 weeks (around 04/27/2024) for with any of the APPS, Face to Face OR Video Visit.    SIGNATURE    Dr. Maire Scot, M.D., F.C.C.P,  Pulmonary and Critical Care Medicine Staff Physician, [facility] Director - Interstitial Lung Disease  Program  Pulmonary Fibrosis [facility] at [facility] [place], [place], [address]  Pager: [phone], If no answer or between  15:00h - 7:00h: call [phone] Telephone: [phone]  3:40 PM 02/24/2024

## 2024-02-24 NOTE — Patient Instructions (Addendum)
 ICD-10-CM   1. Asthma, moderate persistent, poorly-controlled  J45.40     2. 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 Dysphonia  R49.0        Pulmonary function test shows moderate obstruction with significant airway hyperresponsiveness Blood test shows eosinophilia Complaint was asthma is not fully well-controlled In addition years of inhaled steroid have caused damage to vocal cords and caused vocal cord atrophy and dysphonia  - Pulmonary function test shows vibrations of the vocal cord that happen as you repeated times    Plan - Continue Airsupra  as needed [noted that he taking it 1 time daily] - START FASENRA for eosinophilic asthma - Suspect the vocal cords may take at least 1 year to improve or might never improve  Follow-up -rerturn to see nurse practitioner in 2 months for follow-up and progress

## 2024-02-24 NOTE — Progress Notes (Signed)
 Full PFT performed today.

## 2024-02-24 NOTE — Patient Instructions (Signed)
 Full PFT performed today.

## 2024-02-26 NOTE — Telephone Encounter (Signed)
 Pt is following up on  the [facility] lab denial

## 2024-02-27 NOTE — Telephone Encounter (Signed)
 I have reached out a number of times to Viki as well as let Orelia Binet know about this situation

## 2024-03-01 NOTE — Telephone Encounter (Signed)
 This is the last update I have, I have just sent an email to billing to see if they can give me an update.     June 6-  [phone]/ [phone]    SOPHIRA RUMLER denied all tests for coding issues, the invoice  was rebilled with new codes and the current  Invoice denied as a duplicate.  The invoice is currently on a hold and claim development  Is reviewing the denial.  Allow more time for the review to be completed.

## 2024-03-03 ENCOUNTER — Telehealth: Payer: Self-pay

## 2024-03-03 NOTE — Telephone Encounter (Addendum)
 Received Fasenra new start paperwork. Based on income written on application, patient will certainly not qualify for patient assistance program. Therefore, application has been sent to media tab of her chart  Submitted a Prior Authorization request to [facility] for [facility] via CoverMyMeds. Will update once we receive a response.  Key: [id number]

## 2024-03-05 ENCOUNTER — Other Ambulatory Visit (HOSPITAL_COMMUNITY): Payer: Self-pay

## 2024-03-05 NOTE — Telephone Encounter (Signed)
 Received notification from [employer] regarding a prior authorization for [facility]. Authorization has been APPROVED from 12/09/2023 to 09/08/2024. Approval letter sent to scan center.  Per test claim, copay for 28 days supply is $1374.16  Patient can fill through [facility]: [phone]   Patient does not qualify for patient assistance program based on income written.

## 2024-03-08 NOTE — Telephone Encounter (Signed)
 Discussed Tracey Boyd benefits with patient and her husband. Advised that they definitely do not qualify for additional cost assistance with their current household income. Discussedthe current copay for 28 day supply as well as the $2000 OOP max for 2025.  They were interested in knowing how successful Tracey Boyd is which I advised we cannot promise but would need at least 3-4 months of treatment to determine efficacy  They will further discuss and let me know via MyChart if they decide to move forward with Fasenra  Vinh Sachs, PharmD, MPH, BCPS, CPP Clinical Pharmacist (Rheumatology and Pulmonology)

## 2024-03-17 NOTE — Telephone Encounter (Signed)
 Patient scheduled for Fasenra  new start on 03/18/2024.

## 2024-03-18 ENCOUNTER — Ambulatory Visit: Admitting: Pharmacist

## 2024-03-18 DIAGNOSIS — D721 Eosinophilia, unspecified: Secondary | ICD-10-CM

## 2024-03-18 DIAGNOSIS — J4540 Moderate persistent asthma, uncomplicated: Secondary | ICD-10-CM | POA: Diagnosis not present

## 2024-03-18 DIAGNOSIS — Z7189 Other specified counseling: Secondary | ICD-10-CM | POA: Diagnosis not present

## 2024-03-18 MED ORDER — FASENRA PEN 30 MG/ML ~~LOC~~ SOAJ
30.0000 mg | SUBCUTANEOUS | 1 refills | Status: DC
  Filled 2024-04-09 – 2024-04-27 (×2): qty 1, 56d supply, fill #0

## 2024-03-18 MED ORDER — FASENRA PEN 30 MG/ML ~~LOC~~ SOAJ
SUBCUTANEOUS | 0 refills | Status: DC
  Filled 2024-04-09: qty 1, 28d supply, fill #0

## 2024-03-18 MED ORDER — FASENRA PEN 30 MG/ML ~~LOC~~ SOAJ
SUBCUTANEOUS | 0 refills | Status: DC
  Filled 2024-04-09: qty 1, fill #0
  Filled 2024-04-27: qty 1, 56d supply, fill #0

## 2024-03-18 NOTE — Patient Instructions (Addendum)
 Your next FASENRA  dose is due on 04/15/2024, 05/13/2024, then every 8 weeks thereafter (starting on 07/08/2024)  CONTINUE Airsupra  2 puffs twice daily. We recommend daily controller inhaler use with Fasenra .  Your prescription will be shipped from [facility]. Their phone number is [phone] Tracey Boyd will call to schedule shipment and confirm address (his phone is [phone]). They will mail your medication to your home.  You will need to be seen by your provider in 3 to 4 months to assess how FASENRA  is working for you. Please ensure you have a follow-up appointment scheduled in October or November 2025. Call our clinic if you need to make this appointment.  Stay up to date on all routine vaccines: influenza, pneumonia, COVID19, Shingles  How to manage an injection site reaction: Remember the 5 C's: COUNTER - leave on the counter at least 30 minutes but up to overnight to bring medication to room temperature. This may help prevent stinging COLD - place something cold (like an ice gel pack or cold water bottle) on the injection site just before cleansing with alcohol. This may help reduce pain CLARITIN - use Claritin (generic name is loratadine) for the first two weeks of treatment or the day of, the day before, and the day after injecting. This will help to minimize injection site reactions CORTISONE CREAM - apply if injection site is irritated and itching CALL ME - if injection site reaction is bigger than the size of your fist, looks infected, blisters, or if you develop hives

## 2024-03-18 NOTE — Progress Notes (Addendum)
 HPI Patient presents today to [facility] to see pharmacy team for Fasenra  new start for asthma.  Past medical history includes asthma, seasonal allergic rhinitis, chronic sinusitis, chronic laryngitis, vocal cord atrophy, ostoeporosis, nasal polyps, GERD,   Respiratory Medications Current regimen: Airsupra  90-80 mcg/act 2 puffs q6h PRN.  Tried in past: Symbicort  ( worsening vocal cord atrophy)  Patient reports adherence challenges; concerned about inhaler contributing to further voice hoarseness.   OBJECTIVE Allergies  Allergen Reactions   Ace Inhibitors Cough    Reaction to lisinopril   Penicillins Hives    Has patient had a PCN reaction causing immediate rash, facial/tongue/throat swelling, SOB or lightheadedness with hypotension: Yes Has patient had a PCN reaction causing severe rash involving mucus membranes or skin necrosis: No Has patient had a PCN reaction that required hospitalization No Has patient had a PCN reaction occurring within the last 10 years: No If all of the above answers are NO, then may proceed with Cephalosporin use.   Prednisone  Other (See Comments)    REACTION: paresthesias   Sulfa Antibiotics Hives   Tetracycline Other (See Comments)    Outpatient Encounter Medications as of 03/18/2024  Medication Sig   Albuterol -Budesonide  (AIRSUPRA ) 90-80 MCG/ACT AERO Inhale 2 puffs into the lungs every 6 (six) hours as needed (Every 6-8 hours as needed).   B Complex-C (SUPER B COMPLEX PO) Take 1 tablet by mouth daily.   budesonide -formoterol  (SYMBICORT ) 160-4.5 MCG/ACT inhaler Inhale 2 puffs into the lungs 2 (two) times daily.   Calcium  Carb-Cholecalciferol (CALCIUM /VITAMIN D  PO) Take 1,200 mg by mouth daily.    Fexofenadine HCl (MUCINEX ALLERGY PO) Take by mouth as needed.   ketoconazole (NIZORAL) 2 % cream ketoconazole 2 % topical cream  APPLY TO THE AFFECTED AREA(S) BY TOPICAL ROUTE ONCE DAILY   levothyroxine  (SYNTHROID ) 75 MCG tablet Take 1 tablet (75 mcg  total) by mouth every other day.   mometasone  (ELOCON ) 0.1 % cream Apply topically.   Multiple Vitamin (MULTIVITAMIN WITH MINERALS) TABS tablet Take 1 tablet by mouth daily. Centrum Silver   Omega-3 Fatty Acids (FISH OIL CONCENTRATE PO) Take 1,200 mg by mouth daily.    omeprazole  (PRILOSEC) 40 MG capsule Take 1 capsule (40 mg total) by mouth daily.   potassium chloride  (KLOR-CON ) 10 MEQ tablet TAKE 1 TABLET BY MOUTH 2 TIMES DAILY.   Propylene Glycol (SYSTANE BALANCE OP) Place 1 drop into both eyes daily.    rosuvastatin  (CRESTOR ) 10 MG tablet TAKE 1 TABLET BY MOUTH EVERY DAY   No facility-administered encounter medications on file as of 03/18/2024.     Immunization History  Administered Date(s) Administered   Fluad Quad(high Dose 65+) 06/14/2019   Influenza Inj Mdck Quad Pf 07/04/2017   Influenza Split 07/10/2012   Influenza Whole 08/31/2010, 05/20/2011   Influenza, High Dose Seasonal PF 06/09/2018, 06/01/2022   Influenza,inj,Quad PF,6+ Mos 07/22/2013, 08/25/2014   Influenza-Unspecified 05/10/2021, 05/31/2022, 05/20/2023   Moderna SARS-COV2 Booster Vaccination 12/08/2020   PFIZER(Purple Top)SARS-COV-2 Vaccination 10/14/2019, 11/04/2019, 06/08/2020, 05/28/2021   Pfizer(Comirnaty)Fall Seasonal Vaccine 12 years and older 06/01/2022   Pneumococcal Conjugate-13 03/07/2016   Pneumococcal Polysaccharide-23 07/16/2017   Td 08/31/2010   Tdap 01/11/2021   Zoster Recombinant(Shingrix ) 01/11/2021, 03/27/2021   Zoster, Live 11/01/2011     PFTs    Latest Ref Rng & Units 02/24/2024    1:43 PM  PFT Results  FVC-Pre L 1.89   FVC-Predicted Pre % 64   FVC-Post L 2.12   FVC-Predicted Post % 72   Pre FEV1/FVC % %  73   Post FEV1/FCV % % 76   FEV1-Pre L 1.37   FEV1-Predicted Pre % 62   FEV1-Post L 1.60   DLCO uncorrected ml/min/mmHg 15.65   DLCO UNC% % 80   DLCO corrected ml/min/mmHg 15.65   DLCO COR %Predicted % 80   DLVA Predicted % 102   TLC L 4.31   TLC % Predicted % 85   RV %  Predicted % 97      Eosinophils Most recent blood eosinophil count was 500 taken on 01/02/2024.   IgE: Negative allergen panel on 01/02/2024   Assessment   Biologics training for benralizumab  (Fasenra )  Goals of therapy: Mechanism of action: humanized afucosylated, monoclonal antibody (IgG1, kappa) that binds to the alpha subunit of the interleukin-5 receptor. IL-5 is the major cytokine responsible for the growth and differentiation, recruitment, activation, and survival of eosinophils (a cell type associated with inflammation and an important component in the pathogenesis of asthma) Reviewed that Fasenra  is add-on medication and patient must continue maintenance inhaler regimen. Response to therapy: may take 4 months to determine efficacy. Discussed that patients generally feel improvement sooner than 4 months.  Side effects: antibody development (13%), headache (8%), pharyngitis (5%), injection site reaction (2.2%)  Dose: 30 mg subcutaneously at Week 0, Week 4, Week 8, then 30mg  every 8 weeks thereafter  Administration/Storage:   Reviewed administration sites of thigh or abdomen (at least 2-3 inches away from abdomen). Reviewed the upper arm is only appropriate if caregiver is administering injection  Do not shake the pen/syringe as this could lead to product foaming or precipitation. \ Reviewed storage of medication in refrigerator. Reviewed that Fasenra  can be stored at room temperature in unopened carton for up to 14 days.  Side effects: headache (19%), injection site reaction (7-15%), antibody development (6%), backache (5%), fatigue (5%)  Access: Approval of Fasenra  through: insurance Patient will pay towards $2000 out-of-pocket maximum for the year   Patient's husband administered Fasenra  30mg /mL in left lower abdomen using sample Fasenra  30mg /mL autoinjector pen NDC: 9689-8169-14 Lot: [id number] Expiration: 09/2025  Patient monitored for 30 minutes for adverse reaction.   Patient tolerated Fasenra  well.  Injection site noted. Patient denies itchiness and irritation at injection., No swelling or redness noted., and Reviewed injection site reaction management with patient verbally and printed information for review in AVS  Medication Reconciliation  A drug regimen assessment was performed, including review of allergies, interactions, disease-state management, dosing and immunization history. Medications were reviewed with the patient, including name, instructions, indication, goals of therapy, potential side effects, importance of adherence, and safe use.  Drug interaction(s): none  Emphasized importance of controller inhaler use while on Fasenra .   PLAN Continue Fasenra  30mg  at Week 4 on 04/15/2024 and Week 8 on 05/13/2024. Then continue Fasenra  30mg  every 8 weeks thereafter starting on 07/08/2024. Rx sent to: [facility]: [phone] . Patient provided with pharmacy phone number and advised that Alwin will call to schedule shipment to home.  Continue maintenance asthma regimen of: Airsupra  90-80 mcg/act 2 puffs q6h PRN.  Emphasized importance of controller inhaler use while on Fasenra . Reviewed FDA approval as add-on treatment only and possible barriers to continuation of treatment in future through insurance  All questions encouraged and answered.  Instructed patient to reach out with any further questions or concerns.  Thank you for allowing pharmacy to participate in this patient's care.  This appointment required 45 minutes of patient care (this includes precharting, chart review, review of results, face-to-face  care, etc.).

## 2024-03-18 NOTE — Progress Notes (Deleted)
 HPI Patient presents today to [facility] to see pharmacy team for Fasenra  new start.  Past medical history includes ***  Respiratory Medications Current regimen: *** Tried in past: *** Patient reports {Adherence challenges yes no:[id number]::adherence challenges,no known adherence challenges}  OBJECTIVE Allergies  Allergen Reactions   Ace Inhibitors Cough    Reaction to lisinopril   Penicillins Hives    Has patient had a PCN reaction causing immediate rash, facial/tongue/throat swelling, SOB or lightheadedness with hypotension: Yes Has patient had a PCN reaction causing severe rash involving mucus membranes or skin necrosis: No Has patient had a PCN reaction that required hospitalization No Has patient had a PCN reaction occurring within the last 10 years: No If all of the above answers are NO, then may proceed with Cephalosporin use.   Prednisone  Other (See Comments)    REACTION: paresthesias   Sulfa Antibiotics Hives   Tetracycline Other (See Comments)    Outpatient Encounter Medications as of 03/18/2024  Medication Sig   Albuterol -Budesonide  (AIRSUPRA ) 90-80 MCG/ACT AERO Inhale 2 puffs into the lungs every 6 (six) hours as needed (Every 6-8 hours as needed).   B Complex-C (SUPER B COMPLEX PO) Take 1 tablet by mouth daily.   budesonide -formoterol  (SYMBICORT ) 160-4.5 MCG/ACT inhaler Inhale 2 puffs into the lungs 2 (two) times daily.   Calcium  Carb-Cholecalciferol (CALCIUM /VITAMIN D  PO) Take 1,200 mg by mouth daily.    Fexofenadine HCl (MUCINEX ALLERGY PO) Take by mouth as needed.   ketoconazole (NIZORAL) 2 % cream ketoconazole 2 % topical cream  APPLY TO THE AFFECTED AREA(S) BY TOPICAL ROUTE ONCE DAILY   levothyroxine  (SYNTHROID ) 75 MCG tablet Take 1 tablet (75 mcg total) by mouth every other day.   mometasone  (ELOCON ) 0.1 % cream Apply topically.   Multiple Vitamin (MULTIVITAMIN WITH MINERALS) TABS tablet Take 1 tablet by mouth daily. Centrum Silver   Omega-3 Fatty  Acids (FISH OIL CONCENTRATE PO) Take 1,200 mg by mouth daily.    omeprazole  (PRILOSEC) 40 MG capsule Take 1 capsule (40 mg total) by mouth daily.   potassium chloride  (KLOR-CON ) 10 MEQ tablet TAKE 1 TABLET BY MOUTH 2 TIMES DAILY.   Propylene Glycol (SYSTANE BALANCE OP) Place 1 drop into both eyes daily.    rosuvastatin  (CRESTOR ) 10 MG tablet TAKE 1 TABLET BY MOUTH EVERY DAY   No facility-administered encounter medications on file as of 03/18/2024.     Immunization History  Administered Date(s) Administered   Fluad Quad(high Dose 65+) 06/14/2019   Influenza Inj Mdck Quad Pf 07/04/2017   Influenza Split 07/10/2012   Influenza Whole 08/31/2010, 05/20/2011   Influenza, High Dose Seasonal PF 06/09/2018, 06/01/2022   Influenza,inj,Quad PF,6+ Mos 07/22/2013, 08/25/2014   Influenza-Unspecified 05/10/2021, 05/31/2022, 05/20/2023   Moderna SARS-COV2 Booster Vaccination 12/08/2020   PFIZER(Purple Top)SARS-COV-2 Vaccination 10/14/2019, 11/04/2019, 06/08/2020, 05/28/2021   Pfizer(Comirnaty)Fall Seasonal Vaccine 12 years and older 06/01/2022   Pneumococcal Conjugate-13 03/07/2016   Pneumococcal Polysaccharide-23 07/16/2017   Td 08/31/2010   Tdap 01/11/2021   Zoster Recombinant(Shingrix ) 01/11/2021, 03/27/2021   Zoster, Live 11/01/2011     PFTs    Latest Ref Rng & Units 02/24/2024    1:43 PM  PFT Results  FVC-Pre L 1.89   FVC-Predicted Pre % 64   FVC-Post L 2.12   FVC-Predicted Post % 72   Pre FEV1/FVC % % 73   Post FEV1/FCV % % 76   FEV1-Pre L 1.37   FEV1-Predicted Pre % 62   FEV1-Post L 1.60   DLCO uncorrected ml/min/mmHg 15.65  DLCO UNC% % 80   DLCO corrected ml/min/mmHg 15.65   DLCO COR %Predicted % 80   DLVA Predicted % 102   TLC L 4.31   TLC % Predicted % 85   RV % Predicted % 97      Eosinophils Most recent blood eosinophil count was *** cells/microL taken on ***.   IgE: *** on ***   Assessment   Biologics training for benralizumab  (Fasenra )  Goals of  therapy: Mechanism of action: humanized afucosylated, monoclonal antibody (IgG1, kappa) that binds to the alpha subunit of the interleukin-5 receptor. IL-5 is the major cytokine responsible for the growth and differentiation, recruitment, activation, and survival of eosinophils (a cell type associated with inflammation and an important component in the pathogenesis of asthma) Reviewed that Fasenra  is add-on medication and patient must continue maintenance inhaler regimen. Response to therapy: may take 4 months to determine efficacy. Discussed that patients generally feel improvement sooner than 4 months.  Side effects: antibody development (13%), headache (8%), pharyngitis (5%), injection site reaction (2.2%)  Dose: 30 mg subcutaneously at Week 0, Week 4, Week 8, then 30mg  every 8 weeks thereafter  Administration/Storage:   Reviewed administration sites of thigh or abdomen (at least 2-3 inches away from abdomen). Reviewed the upper arm is only appropriate if caregiver is administering injection  Do not shake the pen/syringe as this could lead to product foaming or precipitation. \ Reviewed storage of medication in refrigerator. Reviewed that Fasenra  can be stored at room temperature in unopened carton for up to 14 days.  Side effects: headache (19%), injection site reaction (7-15%), antibody development (6%), backache (5%), fatigue (5%)  Access: Approval of Fasenra  through: insurance Patient will pay towards $2000 out-of-pocket maximum for the year   Patient self-administered Fasenra  30mg /mL in {injsitedsg:28167} using sample Fasenra  30mg /mL autoinjector pen NDC: *** Lot: *** Expiration: ***  Patient monitored for 30 minutes for adverse reaction.  Patient tolerated ***.  Injection site noted. {injectionreaction:30756}  Medication Reconciliation  A drug regimen assessment was performed, including review of allergies, interactions, disease-state management, dosing and immunization  history. Medications were reviewed with the patient, including name, instructions, indication, goals of therapy, potential side effects, importance of adherence, and safe use.  Drug interaction(s): ***  PLAN Continue Fasenra  30mg  at Week 4 on 04/15/2024 and Week 8 on 05/13/2024. Then continue Fasenra  30mg  every 8 weeks thereafter starting on 07/08/2024. Rx sent to: [facility]: [phone] . Patient provided with pharmacy phone number and advised that Alwin will call to schedule shipment to home.  Continue maintenance asthma regimen of: ***  All questions encouraged and answered.  Instructed patient to reach out with any further questions or concerns.  Thank you for allowing pharmacy to participate in this patient's care.  This appointment required *** minutes of patient care (this includes precharting, chart review, review of results, face-to-face care, etc.).

## 2024-03-31 NOTE — Addendum Note (Signed)
 Addended by: DAYNE SHERRY RAMAN on: 03/31/2024 10:12 AM   Modules accepted: Level of Service

## 2024-04-05 ENCOUNTER — Other Ambulatory Visit: Payer: Self-pay

## 2024-04-05 ENCOUNTER — Other Ambulatory Visit (HOSPITAL_COMMUNITY): Payer: Self-pay

## 2024-04-05 DIAGNOSIS — G1220 Motor neuron disease, unspecified: Secondary | ICD-10-CM | POA: Diagnosis not present

## 2024-04-05 DIAGNOSIS — G1221 Amyotrophic lateral sclerosis: Secondary | ICD-10-CM | POA: Diagnosis not present

## 2024-04-05 DIAGNOSIS — R253 Fasciculation: Secondary | ICD-10-CM | POA: Diagnosis not present

## 2024-04-06 ENCOUNTER — Other Ambulatory Visit: Payer: Self-pay

## 2024-04-09 ENCOUNTER — Other Ambulatory Visit (HOSPITAL_COMMUNITY): Payer: Self-pay

## 2024-04-09 ENCOUNTER — Other Ambulatory Visit: Payer: Self-pay

## 2024-04-09 ENCOUNTER — Telehealth: Payer: Self-pay | Admitting: *Deleted

## 2024-04-09 NOTE — Progress Notes (Signed)
 Patient started Fasenra  in office on 03/18/2024. Her husband administered and will moving forward  Patient most concerned regarding her hoarse voice that prevents her from singing at church  Continue Fasenra  30mg  at Week 4 on 04/15/2024 and Week 8 on 05/13/2024. Then continue Fasenra  30mg  every 8 weeks thereafter starting on 07/08/2024.  Continue maintenance asthma regimen of: Airsupra  90-80 mcg/act 2 puffs q6h PRN.  Emphasized importance of controller inhaler use while on Fasenra . Reviewed FDA approval as add-on treatment only and possible barriers to continuation of treatment in future through insurance  Sherry Pennant, PharmD, MPH, BCPS, CPP Clinical Pharmacist (Rheumatology and Pulmonology)

## 2024-04-09 NOTE — Progress Notes (Signed)
 Specialty Pharmacy Initial Fill Coordination Note  Tracey Boyd is a 73 y.o. female contacted today regarding initial fill of specialty medication(s) Benralizumab  (Fasenra  Pen)   Patient requested Delivery   Delivery date: 04/13/24   Verified address: [address]  [place] [address]   Medication will be filled on 04/12/2024.   Patient is aware of $1,253.31 copayment. Credit card info has been collected and forwarded to Tamra at [facility].

## 2024-04-09 NOTE — Telephone Encounter (Signed)
 Patient returning Brighton's call. Re: Fasenra 

## 2024-04-12 ENCOUNTER — Other Ambulatory Visit: Payer: Self-pay

## 2024-04-23 ENCOUNTER — Ambulatory Visit
Admission: RE | Admit: 2024-04-23 | Discharge: 2024-04-23 | Disposition: A | Discharge status: Home / Self Care | Source: Ambulatory Visit | Attending: Internal Medicine | Admitting: Internal Medicine

## 2024-04-23 DIAGNOSIS — Z1231 Encounter for screening mammogram for malignant neoplasm of breast: Secondary | ICD-10-CM

## 2024-04-23 DIAGNOSIS — H5203 Hypermetropia, bilateral: Secondary | ICD-10-CM | POA: Diagnosis not present

## 2024-04-23 DIAGNOSIS — H2513 Age-related nuclear cataract, bilateral: Secondary | ICD-10-CM | POA: Diagnosis not present

## 2024-04-24 ENCOUNTER — Other Ambulatory Visit: Payer: Self-pay | Admitting: Internal Medicine

## 2024-04-24 DIAGNOSIS — K219 Gastro-esophageal reflux disease without esophagitis: Secondary | ICD-10-CM

## 2024-04-27 ENCOUNTER — Other Ambulatory Visit: Payer: Self-pay

## 2024-04-27 ENCOUNTER — Encounter (INDEPENDENT_AMBULATORY_CARE_PROVIDER_SITE_OTHER): Payer: Self-pay

## 2024-04-27 NOTE — Progress Notes (Signed)
 Specialty Pharmacy Refill Coordination Note  Tracey Boyd is a 73 y.o. female contacted today regarding refills of specialty medication(s) Benralizumab  (Fasenra  Pen)   Patient requested (Patient-Rptd) Delivery   Delivery date: 05/14/24   Verified address: (Patient-Rptd) [address] [place] [address]   Medication will be filled on 09.04.25.    LVM- medication refill too soon and patient reports not due for next injection until 9.10 - want to confirm if we can deliver on 9.5

## 2024-04-28 DIAGNOSIS — R490 Dysphonia: Secondary | ICD-10-CM | POA: Diagnosis not present

## 2024-04-28 DIAGNOSIS — R1313 Dysphagia, pharyngeal phase: Secondary | ICD-10-CM | POA: Diagnosis not present

## 2024-04-28 DIAGNOSIS — R1310 Dysphagia, unspecified: Secondary | ICD-10-CM | POA: Diagnosis not present

## 2024-04-28 DIAGNOSIS — R498 Other voice and resonance disorders: Secondary | ICD-10-CM | POA: Diagnosis not present

## 2024-04-28 DIAGNOSIS — J3800 Paralysis of vocal cords and larynx, unspecified: Secondary | ICD-10-CM | POA: Diagnosis not present

## 2024-04-28 DIAGNOSIS — G1221 Amyotrophic lateral sclerosis: Secondary | ICD-10-CM | POA: Diagnosis not present

## 2024-04-28 DIAGNOSIS — R471 Dysarthria and anarthria: Secondary | ICD-10-CM | POA: Diagnosis not present

## 2024-04-28 DIAGNOSIS — R1312 Dysphagia, oropharyngeal phase: Secondary | ICD-10-CM | POA: Diagnosis not present

## 2024-04-28 DIAGNOSIS — J383 Other diseases of vocal cords: Secondary | ICD-10-CM | POA: Diagnosis not present

## 2024-05-11 ENCOUNTER — Ambulatory Visit: Admitting: Adult Health

## 2024-05-13 ENCOUNTER — Encounter: Payer: Self-pay | Admitting: Adult Health

## 2024-05-13 ENCOUNTER — Other Ambulatory Visit: Payer: Self-pay

## 2024-05-13 ENCOUNTER — Ambulatory Visit: Admitting: Adult Health

## 2024-05-13 VITALS — BP 138/86 | HR 76 | Temp 97.6°F | Ht 64.5 in

## 2024-05-13 DIAGNOSIS — J383 Other diseases of vocal cords: Secondary | ICD-10-CM

## 2024-05-13 DIAGNOSIS — R490 Dysphonia: Secondary | ICD-10-CM | POA: Diagnosis not present

## 2024-05-13 DIAGNOSIS — J4540 Moderate persistent asthma, uncomplicated: Secondary | ICD-10-CM

## 2024-05-13 DIAGNOSIS — J45909 Unspecified asthma, uncomplicated: Secondary | ICD-10-CM

## 2024-05-13 DIAGNOSIS — G1221 Amyotrophic lateral sclerosis: Secondary | ICD-10-CM | POA: Diagnosis not present

## 2024-05-13 MED ORDER — SPACER/AERO-HOLDING CHAMBERS DEVI: 1.0000 | 0 refills | Status: AC

## 2024-05-13 NOTE — Progress Notes (Signed)
 @Patient  ID: Tracey Boyd, female    DOB: 09/30/1950, 73 y.o.   MRN: [id number]  Chief Complaint  Patient presents with   Asthma    Referring provider: Joshua Debby CROME, MD  HPI: 73 yo female followed for Asthma with allergic phenotype and Dysphonia   TEST/EVENTS :  CT chest 03/2022 BB linear densities c/w scarring/atx -no nodules   05/13/2024 Follow up : Asthma, Dysphonia , ALS (new dx)  Discussed the use of AI scribe software for clinical note transcription with the patient, who gave verbal consent to proceed.  History of Present Illness Tracey Boyd is a 73 year old female with asthma who presents for a three-month follow-up.   She has asthma with an allergic phenotype and was previously experiencing increased symptoms. She was started on Fasenra  and has received the first two injections, with the third scheduled for next week. Her breathing might be slightly better since starting Fasenra . She is currently using Airsupra , one puff daily. She previously used Symbicort  twice daily but stopped due to ongoing dysphonia symptoms . Has intermittent cough and wheezing.   She has been experiencing issues with her voice for many years, which have progressed to slurred speech and shakiness. 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 A recent speech pathologist /ENT evaluation noted tongue fasciculations suggested seeing an ALS team at [facility]. Notes reviewed in care everywhere. 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 No muscle weakness is reported to date, and she remains active, walking daily and performing household chores. Does note swallow difficulties along with speech changes.   Highly involved with [employer] chapter in the area with [employer].        Allergies  Allergen Reactions   Ace Inhibitors Cough    Reaction to lisinopril   Penicillins  Hives    Has patient had a PCN reaction causing immediate rash, facial/tongue/throat swelling, SOB or lightheadedness with hypotension: Yes Has patient had a PCN reaction causing severe rash involving mucus membranes or skin necrosis: No Has patient had a PCN reaction that required hospitalization No Has patient had a PCN reaction occurring within the last 10 years: No If all of the above answers are NO, then may proceed with Cephalosporin use.   Prednisone  Other (See Comments)    REACTION: paresthesias   Sulfa Antibiotics Hives   Tetracycline Other (See Comments)    Immunization History  Administered Date(s) Administered   Fluad Quad(high Dose 65+) 06/14/2019   INFLUENZA, HIGH DOSE SEASONAL PF 06/09/2018, 06/01/2022   Influenza Inj Mdck Quad Pf 07/04/2017   Influenza Split 07/10/2012   Influenza Whole 08/31/2010, 05/20/2011   Influenza,inj,Quad PF,6+ Mos 07/22/2013, 08/25/2014   Influenza-Unspecified 05/10/2021, 05/31/2022, 05/20/2023   Moderna SARS-COV2 Booster Vaccination 12/08/2020   PFIZER(Purple Top)SARS-COV-2 Vaccination 10/14/2019, 11/04/2019, 06/08/2020, 05/28/2021   Pfizer(Comirnaty)Fall Seasonal Vaccine 12 years and older 06/01/2022   Pneumococcal Conjugate-13 03/07/2016   Pneumococcal Polysaccharide-23 07/16/2017   Td 08/31/2010   Tdap 01/11/2021   Zoster Recombinant(Shingrix ) 01/11/2021, 03/27/2021   Zoster, Live 11/01/2011    Past Medical History:  Diagnosis Date   Allergy    Anemia    as a child   Anxiety    Asthma    Dermatitis herpetiformis    GERD (gastroesophageal reflux disease)    HTN (hypertension)    Hypothyroidism    Osteoporosis     Tobacco History: Social History  Tobacco Use  Smoking Status Never   Passive exposure: Never (PARENTS SMOKED)  Smokeless Tobacco Never  Tobacco Comments   pt states both parents were smokers   Counseling given: Not Answered Tobacco comments: pt states both parents were smokers   Outpatient Medications  Prior to Visit  Medication Sig Dispense Refill   Albuterol -Budesonide  (AIRSUPRA ) 90-80 MCG/ACT AERO Inhale 2 puffs into the lungs every 6 (six) hours as needed (Every 6-8 hours as needed). 10.7 g 1   B Complex-C (SUPER B COMPLEX PO) Take 1 tablet by mouth daily.     benralizumab  (FASENRA  PEN) 30 MG/ML prefilled autoinjector Inject 30mg  into the skin at week 4 (week 0 dose administered in clinic on 03/18/2024) 1 mL 0   benralizumab  (FASENRA  PEN) 30 MG/ML prefilled autoinjector Inject 30mg  into the skin at week 8. 1 mL 0   benralizumab  (FASENRA  PEN) 30 MG/ML prefilled autoinjector Inject 1 mL (30 mg total) into the skin every 8 (eight) weeks. **Maintenance dose** 1 mL 1   budesonide -formoterol  (SYMBICORT ) 160-4.5 MCG/ACT inhaler Inhale 2 puffs into the lungs 2 (two) times daily.     Calcium  Carb-Cholecalciferol (CALCIUM /VITAMIN D  PO) Take 1,200 mg by mouth daily.      Fexofenadine HCl (MUCINEX ALLERGY PO) Take by mouth as needed.     ketoconazole (NIZORAL) 2 % cream ketoconazole 2 % topical cream  APPLY TO THE AFFECTED AREA(S) BY TOPICAL ROUTE ONCE DAILY     levothyroxine  (SYNTHROID ) 75 MCG tablet Take 1 tablet (75 mcg total) by mouth every other day. 45 tablet 1   mometasone  (ELOCON ) 0.1 % cream Apply topically.     Multiple Vitamin (MULTIVITAMIN WITH MINERALS) TABS tablet Take 1 tablet by mouth daily. Centrum Silver     Omega-3 Fatty Acids (FISH OIL CONCENTRATE PO) Take 1,200 mg by mouth daily.      omeprazole  (PRILOSEC) 40 MG capsule TAKE 1 CAPSULE DAILY 90 capsule 1   potassium chloride  (KLOR-CON ) 10 MEQ tablet TAKE 1 TABLET BY MOUTH 2 TIMES DAILY. 180 tablet 1   Propylene Glycol (SYSTANE BALANCE OP) Place 1 drop into both eyes daily.      rosuvastatin  (CRESTOR ) 10 MG tablet TAKE 1 TABLET BY MOUTH EVERY DAY 90 tablet 1   No facility-administered medications prior to visit.     Review of Systems:   Constitutional:   No  weight loss, night sweats,  Fevers, chills,+ fatigue, or   lassitude.  HEENT:   No headaches,  Difficulty swallowing,  Tooth/dental problems, or  Sore throat,                No sneezing, itching, ear ache, nasal congestion, post nasal drip, +speech changes   CV:  No chest pain,  Orthopnea, PND, swelling in lower extremities, anasarca, dizziness, palpitations, syncope.   GI  No heartburn, indigestion, abdominal pain, nausea, vomiting, diarrhea, change in bowel habits, loss of appetite, bloody stools.   Resp: .  No chest wall deformity  Skin: no rash or lesions.  GU: no dysuria, change in color of urine, no urgency or frequency.  No flank pain, no hematuria   MS:  No joint pain or swelling.  No decreased range of motion.  No back pain.    Physical Exam  BP 138/86 (BP Location: Left Arm, Patient Position: Sitting, Cuff Size: Normal)   Pulse 76   Temp 97.6 F (36.4 C) (Oral)   Ht 5' 4.5 (1.638 m)   SpO2 98% Comment: RA  BMI 25.86 kg/m  GEN: A/Ox3; pleasant , NAD, well nourished    HEENT:  [place]/AT,  NOSE-clear, THROAT-clear, no lesions, no postnasal drip or exudate noted. Tongue /chin fasciculations   NECK:  Supple w/ fair ROM; no JVD; normal carotid impulses w/o bruits; no thyromegaly or nodules palpated; no lymphadenopathy.    RESP  Clear  P & A; w/o, wheezes/ rales/ or rhonchi. no accessory muscle use, no dullness to percussion  CARD:  RRR, no m/r/g, no peripheral edema, pulses intact, no cyanosis or clubbing.  GI:   Soft & nt; nml bowel sounds; no organomegaly or masses detected.   Musco: Warm bil, no deformities or joint swelling noted.   Neuro: alert, no focal deficits noted.    Skin: Warm, no lesions or rashes    Lab Results:  CBC    BNP No results found for: BNP  ProBNP No results found for: PROBNP  Imaging:   Administration History     None          Latest Ref Rng & Units 02/24/2024    1:43 PM  PFT Results  FVC-Pre L 1.89   FVC-Predicted Pre % 64   FVC-Post L 2.12   FVC-Predicted Post % 72    Pre FEV1/FVC % % 73   Post FEV1/FCV % % 76   FEV1-Pre L 1.37   FEV1-Predicted Pre % 62   FEV1-Post L 1.60   DLCO uncorrected ml/min/mmHg 15.65   DLCO UNC% % 80   DLCO corrected ml/min/mmHg 15.65   DLCO COR %Predicted % 80   DLVA Predicted % 102   TLC L 4.31   TLC % Predicted % 85   RV % Predicted % 97     No results found for: NITRICOXIDE      Assessment & Plan:   No problem-specific Assessment & Plan notes found for this encounter. Assessment and Plan Assessment & Plan Asthma with allergic phenotype  -appears stable .   Slightly improved since starting Fasenra ,  She continues on Airsupra , having switched from Symbicort .  Continue Fasenra  injections as scheduled. Use Airsupra  two puffs twice daily with a spacer and rinse mouth after use. Order a pulmonary function test in November. Follow up with Doctor Geronimo in three months.  Dsyphonia-Inhaler care discussed. Use spacer with inhalers.   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 No muscle weakness or respiratory involvement is noted to date. She is under the care of the ALS clinic at [facility] and has started riluzole to slow progression. Speech and vocal issues are likely exacerbated by ALS rather than asthma treatment. Continue follow-up at the ALS clinic every three months and riluzole therapy. Monitor for muscle weakness and respiratory involvement. Educate on aspiration precautions, including maintaining an upright posture after meals and avoiding eating before bedtime.  Plan  Patient Instructions  Airsupra  2 puffs every 6hr as needed - add spacer  Continue on Fasenra .  Activity as tolerated  Continue to follow with ALS team at [facility].  Follow up with Dr. Geronimo in 3 months with PFT and As needed            Madelin Stank, NP 05/13/2024

## 2024-05-13 NOTE — Patient Instructions (Addendum)
 Airsupra  2 puffs every 6hr as needed - add spacer  Continue on Fasenra .  Activity as tolerated  Continue to follow with ALS team at [facility].  Follow up with Dr. Geronimo in 3 months with PFT and As needed

## 2024-05-27 DIAGNOSIS — H2512 Age-related nuclear cataract, left eye: Secondary | ICD-10-CM | POA: Diagnosis not present

## 2024-05-27 DIAGNOSIS — H25812 Combined forms of age-related cataract, left eye: Secondary | ICD-10-CM | POA: Diagnosis not present

## 2024-05-27 DIAGNOSIS — Z961 Presence of intraocular lens: Secondary | ICD-10-CM | POA: Diagnosis not present

## 2024-06-03 DIAGNOSIS — Z961 Presence of intraocular lens: Secondary | ICD-10-CM | POA: Diagnosis not present

## 2024-06-03 DIAGNOSIS — H25811 Combined forms of age-related cataract, right eye: Secondary | ICD-10-CM | POA: Diagnosis not present

## 2024-06-03 DIAGNOSIS — H2511 Age-related nuclear cataract, right eye: Secondary | ICD-10-CM | POA: Diagnosis not present

## 2024-06-10 DIAGNOSIS — D1801 Hemangioma of skin and subcutaneous tissue: Secondary | ICD-10-CM | POA: Diagnosis not present

## 2024-06-10 DIAGNOSIS — L812 Freckles: Secondary | ICD-10-CM | POA: Diagnosis not present

## 2024-06-10 DIAGNOSIS — D692 Other nonthrombocytopenic purpura: Secondary | ICD-10-CM | POA: Diagnosis not present

## 2024-06-10 DIAGNOSIS — L821 Other seborrheic keratosis: Secondary | ICD-10-CM | POA: Diagnosis not present

## 2024-07-02 ENCOUNTER — Other Ambulatory Visit (HOSPITAL_COMMUNITY): Payer: Self-pay

## 2024-07-02 ENCOUNTER — Other Ambulatory Visit: Payer: Self-pay | Admitting: Internal Medicine

## 2024-07-02 ENCOUNTER — Other Ambulatory Visit: Payer: Self-pay

## 2024-07-02 DIAGNOSIS — D721 Eosinophilia, unspecified: Secondary | ICD-10-CM

## 2024-07-02 DIAGNOSIS — J4540 Moderate persistent asthma, uncomplicated: Secondary | ICD-10-CM

## 2024-07-02 MED ORDER — FASENRA PEN 30 MG/ML ~~LOC~~ SOAJ
30.0000 mg | SUBCUTANEOUS | 2 refills | Status: DC
  Filled 2024-07-02: qty 1, 56d supply, fill #0

## 2024-07-02 MED ORDER — FASENRA PEN 30 MG/ML ~~LOC~~ SOAJ
30.0000 mg | SUBCUTANEOUS | 2 refills | Status: AC
  Filled 2024-07-02 – 2024-07-07 (×3): qty 1, 56d supply, fill #0
  Filled 2024-09-06 – 2024-09-13 (×2): qty 1, 56d supply, fill #1

## 2024-07-02 NOTE — Telephone Encounter (Signed)
 Refill sent for FASENRA  to [facility]: [phone]   Dose: 30mg  [place] every 8 weeks  Last OV: 05/13/24 Provider: Dr. Geronimo  Next OV: 08/17/24  Aleck Puls, PharmD, BCPS Clinical Pharmacist  [facility]

## 2024-07-02 NOTE — Addendum Note (Signed)
 Addended by: Marquon Alcala L on: 07/02/2024 01:38 PM   Modules accepted: Orders

## 2024-07-02 NOTE — Telephone Encounter (Signed)
 Pt requesting refill of specialty medication - routing to Rx team to advise.

## 2024-07-05 ENCOUNTER — Other Ambulatory Visit (HOSPITAL_COMMUNITY): Payer: Self-pay

## 2024-07-06 DIAGNOSIS — Z961 Presence of intraocular lens: Secondary | ICD-10-CM | POA: Diagnosis not present

## 2024-07-07 ENCOUNTER — Other Ambulatory Visit: Payer: Self-pay

## 2024-07-07 ENCOUNTER — Encounter (INDEPENDENT_AMBULATORY_CARE_PROVIDER_SITE_OTHER): Payer: Self-pay

## 2024-07-07 NOTE — Progress Notes (Signed)
 Specialty Pharmacy Refill Coordination Note  Tracey Boyd is a 73 y.o. female contacted today regarding refills of specialty medication(s) Benralizumab  (Fasenra  Pen)   Patient requested [name] at [facility] at [place] date: 07/15/24   Medication will be filled on: 07/14/24

## 2024-07-14 ENCOUNTER — Other Ambulatory Visit: Payer: Self-pay

## 2024-07-18 ENCOUNTER — Other Ambulatory Visit: Payer: Self-pay | Admitting: Internal Medicine

## 2024-07-18 DIAGNOSIS — E039 Hypothyroidism, unspecified: Secondary | ICD-10-CM

## 2024-07-19 ENCOUNTER — Other Ambulatory Visit (HOSPITAL_COMMUNITY): Payer: Self-pay

## 2024-07-20 ENCOUNTER — Other Ambulatory Visit: Payer: Self-pay | Admitting: Internal Medicine

## 2024-07-28 DIAGNOSIS — J383 Other diseases of vocal cords: Secondary | ICD-10-CM | POA: Diagnosis not present

## 2024-07-28 DIAGNOSIS — R1313 Dysphagia, pharyngeal phase: Secondary | ICD-10-CM | POA: Diagnosis not present

## 2024-07-28 DIAGNOSIS — R1312 Dysphagia, oropharyngeal phase: Secondary | ICD-10-CM | POA: Diagnosis not present

## 2024-07-28 DIAGNOSIS — R490 Dysphonia: Secondary | ICD-10-CM | POA: Diagnosis not present

## 2024-07-28 DIAGNOSIS — R1310 Dysphagia, unspecified: Secondary | ICD-10-CM | POA: Diagnosis not present

## 2024-07-28 DIAGNOSIS — J3800 Paralysis of vocal cords and larynx, unspecified: Secondary | ICD-10-CM | POA: Diagnosis not present

## 2024-07-28 DIAGNOSIS — G1221 Amyotrophic lateral sclerosis: Secondary | ICD-10-CM | POA: Diagnosis not present

## 2024-07-28 DIAGNOSIS — R498 Other voice and resonance disorders: Secondary | ICD-10-CM | POA: Diagnosis not present

## 2024-07-28 DIAGNOSIS — R471 Dysarthria and anarthria: Secondary | ICD-10-CM | POA: Diagnosis not present

## 2024-08-06 ENCOUNTER — Other Ambulatory Visit: Payer: Self-pay | Admitting: Internal Medicine

## 2024-08-06 DIAGNOSIS — E785 Hyperlipidemia, unspecified: Secondary | ICD-10-CM

## 2024-08-12 ENCOUNTER — Ambulatory Visit

## 2024-08-12 ENCOUNTER — Ambulatory Visit: Admitting: Adult Health

## 2024-08-12 DIAGNOSIS — J4540 Moderate persistent asthma, uncomplicated: Secondary | ICD-10-CM | POA: Diagnosis not present

## 2024-08-12 LAB — PULMONARY FUNCTION TEST
DL/VA % pred: 99 %
DL/VA: 4.1 ml/min/mmHg/L
DLCO unc % pred: 75 %
DLCO unc: 14.6 ml/min/mmHg
FEF 25-75 Post: 1.7 L/s
FEF 25-75 Pre: 0.83 L/s
FEF2575-%Change-Post: 105 %
FEF2575-%Pred-Post: 96 %
FEF2575-%Pred-Pre: 47 %
FEV1-%Change-Post: 30 %
FEV1-%Pred-Post: 72 %
FEV1-%Pred-Pre: 55 %
FEV1-Post: 1.58 L
FEV1-Pre: 1.21 L
FEV1FVC-%Change-Post: 13 %
FEV1FVC-%Pred-Pre: 87 %
FEV6-%Change-Post: 14 %
FEV6-%Pred-Post: 76 %
FEV6-%Pred-Pre: 66 %
FEV6-Post: 2.09 L
FEV6-Pre: 1.83 L
FEV6FVC-%Change-Post: 0 %
FEV6FVC-%Pred-Post: 104 %
FEV6FVC-%Pred-Pre: 104 %
FVC-%Change-Post: 14 %
FVC-%Pred-Post: 72 %
FVC-%Pred-Pre: 63 %
FVC-Post: 2.1 L
FVC-Pre: 1.83 L
Post FEV1/FVC ratio: 75 %
Post FEV6/FVC ratio: 100 %
Pre FEV1/FVC ratio: 66 %
Pre FEV6/FVC Ratio: 100 %
RV % pred: 110 %
RV: 2.48 L
TLC % pred: 90 %
TLC: 4.57 L

## 2024-08-12 NOTE — Progress Notes (Signed)
 Full pft performed today

## 2024-08-12 NOTE — Patient Instructions (Signed)
 Full pft performed today

## 2024-08-17 ENCOUNTER — Ambulatory Visit: Admitting: Adult Health

## 2024-08-20 ENCOUNTER — Ambulatory Visit: Admitting: Adult Health

## 2024-08-20 ENCOUNTER — Encounter: Payer: Self-pay | Admitting: Adult Health

## 2024-08-20 VITALS — BP 116/76 | HR 78 | Temp 97.6°F | Ht 64.0 in | Wt 157.0 lb

## 2024-08-20 DIAGNOSIS — G1221 Amyotrophic lateral sclerosis: Secondary | ICD-10-CM

## 2024-08-20 DIAGNOSIS — R490 Dysphonia: Secondary | ICD-10-CM | POA: Diagnosis not present

## 2024-08-20 DIAGNOSIS — J4540 Moderate persistent asthma, uncomplicated: Secondary | ICD-10-CM | POA: Diagnosis not present

## 2024-08-20 DIAGNOSIS — D721 Eosinophilia, unspecified: Secondary | ICD-10-CM

## 2024-08-20 NOTE — Progress Notes (Signed)
 @Patient  ID: Tracey Boyd, female    DOB: Sep 14, 1950, 73 y.o.   MRN: [id number]  Chief Complaint  Patient presents with   Medical Management of Chronic Issues    Asthma     Referring provider: Orlie Madelin RAMAN, NP  HPI: 73 year old female never smoker followed for asthma with an allergic phenotype Medical history significant for ALS (newly diagnosed 2025 followed at [facility] ALS center), chronic dysphonia Highly involved in the [facility]   TEST/EVENTS : Reviewed 08/20/2024  Eosinophils absolute count April 2025 500 Allergy panel April 2025 negative Cardiac CT August 29, 2023 visualized lung fields scattered areas of mild scarring bilaterally, no suspicious pulmonary nodules  Pulmonary function test February 24, 2024 FEV1 72%, ratio 76, FVC 72%, positive bronchodilator response, DLCO 80%.  08/2024 FEV1 at 72%, ratio 75, FVC 72%, positive bronchodilator response with significant mid flow reversibility, DLCO 75%./    Discussed the use of AI scribe software for clinical note transcription with the patient, who gave verbal consent to proceed.  History of Present Illness Tracey Boyd is a 73 year old female with asthma who presents for a pulmonary follow-up. She says overall her asthma is doing better feels that Fasenra  has really helped.  Previously was on Symbicort  but stopped due to ongoing dysphonia symptoms.  She denies any cough or wheezing.  Says that since starting Fasenra  symptoms have significantly improved.  She was set up for pulmonary function testing which was completed last week that showed stable lung function with FEV1 at 72%, ratio 75, FVC 72%, positive bronchodilator response with significant mid flow reversibility, DLCO 75%. She experiences no shortness of breath, even with exertion, and no major coughing or wheezing. She uses the [employer] inhaler less than once a day as needed and has a spacer for it. She is on Fasenra , which has been very  helpful. O2 saturations today in the office are 96%.  She has received both her flu and COVID vaccinations. She reports no balance problems and is able to walk without issues, although she sometimes feels unsure of herself while walking. She is not currently engaged in any rehabilitation or exercise program.  Patient has ALS followed at [facility] ALS center.  Began with chronic voice issues for years with progressive decline to slurred speech and shakiness, tongue fasciculations.  On Riluzole.  Says she has an upcoming appointment in February at the ALS center.  Says overall appears to be stable.  She has occasional swallowing difficulties.  Denies any shortness of breath.  Does have some intermittent swallowing difficulties with thin liquids.   Allergies[1]  Immunization History  Administered Date(s) Administered   Fluad Quad(high Dose 65+) 06/14/2019   INFLUENZA, HIGH DOSE SEASONAL PF 06/09/2018, 06/01/2022   Influenza Inj Mdck Quad Pf 07/04/2017   Influenza Split 07/10/2012   Influenza Whole 08/31/2010, 05/20/2011   Influenza,inj,Quad PF,6+ Mos 07/22/2013, 08/25/2014   Influenza-Unspecified 05/10/2021, 05/31/2022, 05/20/2023   Moderna SARS-COV2 Booster Vaccination 12/08/2020   PFIZER(Purple Top)SARS-COV-2 Vaccination 10/14/2019, 11/04/2019, 06/08/2020, 05/28/2021   Pfizer(Comirnaty)Fall Seasonal Vaccine 12 years and older 06/01/2022   Pneumococcal Conjugate-13 03/07/2016   Pneumococcal Polysaccharide-23 07/16/2017   Td 08/31/2010   Tdap 01/11/2021   Zoster Recombinant(Shingrix ) 01/11/2021, 03/27/2021   Zoster, Live 11/01/2011    Past Medical History:  Diagnosis Date   Allergy    Anemia    as a child   Anxiety    Asthma    Dermatitis herpetiformis    GERD (gastroesophageal reflux disease)  HTN (hypertension)    Hypothyroidism    Osteoporosis     Tobacco History: Tobacco Use History[2] Counseling given: Not Answered Tobacco comments: pt states both parents were  smokers   Outpatient Medications Prior to Visit  Medication Sig Dispense Refill   Albuterol -Budesonide  (AIRSUPRA ) 90-80 MCG/ACT AERO INHALE 2 PUFFS INTO THE LUNGS EVERY 6 (SIX) HOURS AS NEEDED (EVERY 6-8 HOURS AS NEEDED). 10.7 g 1   B Complex-C (SUPER B COMPLEX PO) Take 1 tablet by mouth daily.     benralizumab  (FASENRA  PEN) 30 MG/ML prefilled autoinjector Inject 1 mL (30 mg total) into the skin every 8 (eight) weeks. 1 mL 2   Calcium  Carb-Cholecalciferol (CALCIUM /VITAMIN D  PO) Take 1,200 mg by mouth daily.      Fexofenadine HCl (MUCINEX ALLERGY PO) Take by mouth as needed.     ketoconazole (NIZORAL) 2 % cream ketoconazole 2 % topical cream  APPLY TO THE AFFECTED AREA(S) BY TOPICAL ROUTE ONCE DAILY     levothyroxine  (SYNTHROID ) 75 MCG tablet TAKE 1 TABLET EVERY OTHER  DAY 45 tablet 1   metroNIDAZOLE (METROGEL) 0.75 % gel Apply 1 Application topically 2 (two) times daily as needed.     mometasone  (ELOCON ) 0.1 % cream Apply topically.     Multiple Vitamin (MULTIVITAMIN WITH MINERALS) TABS tablet Take 1 tablet by mouth daily. Centrum Silver     omeprazole  (PRILOSEC) 40 MG capsule TAKE 1 CAPSULE DAILY 90 capsule 1   potassium chloride  (KLOR-CON ) 10 MEQ tablet TAKE 1 TABLET BY MOUTH 2 TIMES DAILY. 180 tablet 1   Propylene Glycol (SYSTANE BALANCE OP) Place 1 drop into both eyes daily.  (Patient taking differently: Place 1 drop into both eyes daily as needed.)     riluzole (RILUTEK) 50 MG tablet Take 50 mg by mouth every 12 (twelve) hours.     rosuvastatin  (CRESTOR ) 10 MG tablet TAKE 1 TABLET BY MOUTH EVERY DAY 90 tablet 1   Spacer/Aero-Holding Chambers DEVI 1 each by Does not apply route as directed. 1 each 0   budesonide -formoterol  (SYMBICORT ) 160-4.5 MCG/ACT inhaler Inhale 2 puffs into the lungs 2 (two) times daily. (Patient not taking: Reported on 08/20/2024)     Omega-3 Fatty Acids (FISH OIL CONCENTRATE PO) Take 1,200 mg by mouth daily.  (Patient not taking: Reported on 08/20/2024)     No  facility-administered medications prior to visit.     Review of Systems:   Constitutional:   No  weight loss, night sweats,  Fevers, chills, fatigue, or  lassitude.  HEENT:   No headaches,   Tooth/dental problems, or  Sore throat,                +dysphonia   CV:  No chest pain,  Orthopnea, PND, swelling in lower extremities, anasarca, dizziness, palpitations, syncope.   GI  No heartburn, indigestion, abdominal pain, nausea, vomiting, diarrhea, change in bowel habits, loss of appetite, bloody stools.   Resp: No shortness of breath with exertion or at rest.  No excess mucus, no productive cough,  No non-productive cough,  No coughing up of blood.  No change in color of mucus.  No wheezing.  No chest wall deformity  Skin: no rash or lesions.  GU: no dysuria, change in color of urine, no urgency or frequency.  No flank pain, no hematuria   MS:  No joint pain or swelling.  No decreased range of motion.  No back pain.    Physical Exam  BP 116/76   Pulse 78  Temp 97.6 F (36.4 C)   Ht 5' 4 (1.626 m) Comment: Per pt  Wt 157 lb (71.2 kg)   SpO2 96% Comment: RA  BMI 26.95 kg/m   GEN: A/Ox3; pleasant , NAD, well nourished    HEENT:  [place]/AT, NOSE-clear, THROAT-clear, no lesions, no postnasal drip or exudate noted. Tongue fasciculations  NECK:  Supple w/ fair ROM; no JVD; normal carotid impulses w/o bruits; no thyromegaly or nodules palpated; no lymphadenopathy.    RESP  Clear  P & A; w/o, wheezes/ rales/ or rhonchi. no accessory muscle use, no dullness to percussion  CARD:  RRR, no m/r/g, no peripheral edema, pulses intact, no cyanosis or clubbing.  GI:   Soft & nt; nml bowel sounds; no organomegaly or masses detected.   Musco: Warm bil, no deformities or joint swelling noted.   Neuro: alert, no focal deficits noted.    Skin: Warm, no lesions or rashes    Lab Results:Reviewed 08/20/2024   CBC    Component Value Date/Time   WBC 5.6 01/02/2024 1048   RBC 4.48  01/02/2024 1048   HGB 13.9 01/02/2024 1048   HCT 41.5 01/02/2024 1048   PLT 218.0 01/02/2024 1048   MCV 92.7 01/02/2024 1048   MCH 29.9 11/08/2016 1433   MCHC 33.6 01/02/2024 1048   RDW 13.6 01/02/2024 1048   LYMPHSABS 1.4 01/02/2024 1048   MONOABS 0.5 01/02/2024 1048   EOSABS 0.5 01/02/2024 1048   BASOSABS 0.0 01/02/2024 1048    BMET    Component Value Date/Time   NA 144 02/18/2024 1105   K 4.1 02/18/2024 1105   CL 105 02/18/2024 1105   CO2 27 02/18/2024 1105   GLUCOSE 89 02/18/2024 1105   BUN 21 02/18/2024 1105   CREATININE 0.87 02/18/2024 1105   CALCIUM  9.9 02/18/2024 1105   GFRNONAA >60 11/08/2016 1433   GFRAA >60 11/08/2016 1433    BNP No results found for: BNP  ProBNP No results found for: PROBNP  Imaging: No results found.  Administration History     None          Latest Ref Rng & Units 08/12/2024    8:40 AM 02/24/2024    1:43 PM  PFT Results  FVC-Pre L 1.83  1.89   FVC-Predicted Pre % 63  64   FVC-Post L 2.10  2.12   FVC-Predicted Post % 72  72   Pre FEV1/FVC % % 66  73   Post FEV1/FCV % % 75  76   FEV1-Pre L 1.21  1.37   FEV1-Predicted Pre % 55  62   FEV1-Post L 1.58  1.60   DLCO uncorrected ml/min/mmHg 14.60  15.65   DLCO UNC% % 75  80   DLCO corrected ml/min/mmHg  15.65   DLCO COR %Predicted %  80   DLVA Predicted % 99  102   TLC L 4.57  4.31   TLC % Predicted % 90  85   RV % Predicted % 110  97     No results found for: NITRICOXIDE      No data to display              Assessment & Plan:   Assessment and Plan Assessment & Plan Moderate persistent Asthma with allergic phenotype-improved symptom control on Fasenra .  Unable to tolerate Symbicort . Her asthma is well-controlled with current treatment. Pulmonary function tests show stable lung function with FVC at 72% and diffusing capacityconsistent with previous results. She experiences no significant  dyspnea, coughing, or wheezing. Fasenra  is effective, and she uses the  Air Supra inhaler as needed.Oxygen saturation is normal at 96%. Continue Fasenra  with the next dose in January. Use Air Supra inhaler as needed. A follow-up is scheduled in four months.  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  Use spacer with inhaler.  Rinse well after inhaler.  Continue on current regimen   Amyotrophic lateral sclerosis-appears stable.  Continue follow-up with [facility] ALS center.  Presented with dysarthria, tongue fasciculations.  Currently no apparent muscle weakness or respiratory involvement noted today.  Continue on current regimen and follow-up with ALS clinic.  Continue with aspiration precautions.  Pulmonary function testing appeared stable.  O2 saturations are normal.  Plan  Patient Instructions  Airsupra  2 puffs every 6hr as needed with spacer.  Continue on Fasenra .  Activity as tolerated  Continue to follow with ALS team at [facility].  Follow up with Dr. Geronimo in 4 months and As needed             Madelin Stank, NP 08/20/2024  I spent 35   minutes dedicated to the care of this patient on the date of this encounter to include pre-visit review of records, face-to-face time with the patient discussing conditions above, post visit ordering of testing, clinical documentation with the electronic health record, making appropriate referrals as documented, and communicating necessary findings to members of the patients care team.      [1]  Allergies Allergen Reactions   Ace Inhibitors Cough    Reaction to lisinopril   Penicillins Hives    Has patient had a PCN reaction causing immediate rash, facial/tongue/throat swelling, SOB or lightheadedness with hypotension: Yes Has patient had a PCN reaction causing severe rash involving mucus membranes or skin necrosis: No Has patient had a PCN reaction that required hospitalization No Has patient had a PCN reaction occurring within the last 10 years: No If all of the above  answers are NO, then may proceed with Cephalosporin use.   Prednisone  Other (See Comments)    REACTION: paresthesias   Sulfa Antibiotics Hives   Tetracycline Other (See Comments)  [2]  Social History Tobacco Use  Smoking Status Never   Passive exposure: Never (PARENTS SMOKED)  Smokeless Tobacco Never  Tobacco Comments   pt states both parents were smokers

## 2024-08-20 NOTE — Patient Instructions (Addendum)
 Airsupra  2 puffs every 6hr as needed with spacer.  Continue on Fasenra .  Activity as tolerated  Continue to follow with ALS team at [facility].  Follow up with Dr. Geronimo in 4 months and As needed

## 2024-09-06 ENCOUNTER — Other Ambulatory Visit: Payer: Self-pay

## 2024-09-08 ENCOUNTER — Other Ambulatory Visit (HOSPITAL_COMMUNITY): Payer: Self-pay

## 2024-09-10 ENCOUNTER — Other Ambulatory Visit: Payer: Self-pay

## 2024-09-13 ENCOUNTER — Other Ambulatory Visit: Payer: Self-pay

## 2024-09-13 NOTE — Progress Notes (Signed)
 Specialty Pharmacy Refill Coordination Note  Tracey Boyd is a 74 y.o. female contacted today regarding refills of specialty medication(s) Benralizumab  (Fasenra  Pen)   Patient requested Delivery   Delivery date: 09/15/24   Verified address: [address] [place] [address]   Medication will be filled on: 09/14/24  Husband aware of high copay for this fill - not enrolled in MPP.

## 2024-09-14 ENCOUNTER — Other Ambulatory Visit: Payer: Self-pay

## 2024-09-22 ENCOUNTER — Other Ambulatory Visit (HOSPITAL_COMMUNITY): Payer: Self-pay

## 2024-10-02 ENCOUNTER — Other Ambulatory Visit: Payer: Self-pay | Admitting: Internal Medicine

## 2024-10-02 DIAGNOSIS — E876 Hypokalemia: Secondary | ICD-10-CM

## 2024-10-02 DIAGNOSIS — E039 Hypothyroidism, unspecified: Secondary | ICD-10-CM

## 2024-11-17 ENCOUNTER — Ambulatory Visit

## 2024-11-17 ENCOUNTER — Encounter: Admitting: Internal Medicine

## 2024-12-20 ENCOUNTER — Ambulatory Visit: Admitting: Adult Health
